# Patient Record
Sex: Female | Born: 1980 | Race: White | Hispanic: No | Smoking: Never smoker
Health system: Southern US, Community
[De-identification: ages and names within clinical notes are randomized; demographics above are authoritative.]

## PROBLEM LIST (undated history)

## (undated) ENCOUNTER — Ambulatory Visit: Admission: EM | Payer: Self-pay

## (undated) DIAGNOSIS — R87629 Unspecified abnormal cytological findings in specimens from vagina: Secondary | ICD-10-CM

## (undated) DIAGNOSIS — F32A Depression, unspecified: Secondary | ICD-10-CM

## (undated) DIAGNOSIS — F419 Anxiety disorder, unspecified: Secondary | ICD-10-CM

## (undated) DIAGNOSIS — F329 Major depressive disorder, single episode, unspecified: Secondary | ICD-10-CM

## (undated) DIAGNOSIS — K219 Gastro-esophageal reflux disease without esophagitis: Secondary | ICD-10-CM

## (undated) DIAGNOSIS — I10 Essential (primary) hypertension: Secondary | ICD-10-CM

## (undated) DIAGNOSIS — IMO0002 Reserved for concepts with insufficient information to code with codable children: Secondary | ICD-10-CM

## (undated) DIAGNOSIS — R87619 Unspecified abnormal cytological findings in specimens from cervix uteri: Secondary | ICD-10-CM

## (undated) HISTORY — DX: Essential (primary) hypertension: I10

## (undated) HISTORY — DX: Reserved for concepts with insufficient information to code with codable children: IMO0002

## (undated) HISTORY — DX: Unspecified abnormal cytological findings in specimens from vagina: R87.629

## (undated) HISTORY — DX: Unspecified abnormal cytological findings in specimens from cervix uteri: R87.619

## (undated) HISTORY — PX: OTHER SURGICAL HISTORY: SHX169

## (undated) HISTORY — DX: Gastro-esophageal reflux disease without esophagitis: K21.9

---

## 2008-11-16 ENCOUNTER — Ambulatory Visit: Payer: Self-pay | Admitting: Obstetrics & Gynecology

## 2008-11-16 LAB — CONVERTED CEMR LAB
Basophils Relative: 0 % (ref 0–1)
Eosinophils Absolute: 0.1 10*3/uL (ref 0.0–0.7)
Hemoglobin: 13.2 g/dL (ref 12.0–15.0)
Hepatitis B Surface Ag: NEGATIVE
Lymphs Abs: 2.2 10*3/uL (ref 0.7–4.0)
MCHC: 32 g/dL (ref 30.0–36.0)
MCV: 89.8 fL (ref 78.0–100.0)
Monocytes Absolute: 0.4 10*3/uL (ref 0.1–1.0)
Monocytes Relative: 6 % (ref 3–12)
Neutrophils Relative %: 60 % (ref 43–77)
RBC: 4.6 M/uL (ref 3.87–5.11)
Rh Type: POSITIVE
Rubella: 27 intl units/mL — ABNORMAL HIGH
WBC: 6.8 10*3/uL (ref 4.0–10.5)
hCG, Beta Chain, Quant, S: 148.9 milliintl units/mL

## 2008-11-19 ENCOUNTER — Inpatient Hospital Stay (HOSPITAL_COMMUNITY): Admission: AD | Admit: 2008-11-19 | Discharge: 2008-11-19 | Payer: Self-pay | Admitting: Obstetrics and Gynecology

## 2008-11-21 ENCOUNTER — Inpatient Hospital Stay (HOSPITAL_COMMUNITY): Admission: AD | Admit: 2008-11-21 | Discharge: 2008-11-21 | Payer: Self-pay | Admitting: Obstetrics and Gynecology

## 2008-11-24 ENCOUNTER — Ambulatory Visit (HOSPITAL_COMMUNITY): Admission: AD | Admit: 2008-11-24 | Discharge: 2008-11-24 | Payer: Self-pay | Admitting: Obstetrics & Gynecology

## 2008-11-28 ENCOUNTER — Ambulatory Visit: Payer: Self-pay | Admitting: Family Medicine

## 2008-11-28 ENCOUNTER — Encounter: Payer: Self-pay | Admitting: Obstetrics & Gynecology

## 2009-12-26 ENCOUNTER — Other Ambulatory Visit: Admission: RE | Admit: 2009-12-26 | Discharge: 2009-12-26 | Payer: Self-pay | Admitting: Obstetrics and Gynecology

## 2010-05-30 LAB — DIFFERENTIAL
Basophils Absolute: 0 10*3/uL (ref 0.0–0.1)
Basophils Relative: 0 % (ref 0–1)
Eosinophils Absolute: 0.1 10*3/uL (ref 0.0–0.7)
Neutro Abs: 6.3 10*3/uL (ref 1.7–7.7)
Neutrophils Relative %: 65 % (ref 43–77)

## 2010-05-30 LAB — CBC
Platelets: 425 10*3/uL — ABNORMAL HIGH (ref 150–400)
RBC: 4.31 MIL/uL (ref 3.87–5.11)
WBC: 9.7 10*3/uL (ref 4.0–10.5)

## 2010-05-30 LAB — CREATININE, SERUM
Creatinine, Ser: 0.67 mg/dL (ref 0.4–1.2)
GFR calc Af Amer: >60 mL/min (ref >60)
GFR calc non Af Amer: >60 mL/min (ref >60)

## 2010-05-30 LAB — HCG, QUANTITATIVE, PREGNANCY
hCG, Beta Chain, Quant, S: 1141 m[IU]/mL — ABNORMAL HIGH (ref <5)
hCG, Beta Chain, Quant, S: 1194 m[IU]/mL — ABNORMAL HIGH (ref <5)

## 2010-05-30 LAB — BUN: BUN: 3 mg/dL — ABNORMAL LOW (ref 6–23)

## 2010-05-30 LAB — ABO/RH: ABO/RH(D): O POS

## 2010-06-02 ENCOUNTER — Inpatient Hospital Stay (HOSPITAL_COMMUNITY): Payer: BC Managed Care – PPO

## 2010-06-02 ENCOUNTER — Inpatient Hospital Stay (HOSPITAL_COMMUNITY)
Admission: AD | Admit: 2010-06-02 | Discharge: 2010-06-03 | DRG: 886 | Disposition: A | Discharge status: Home / Self Care | Payer: BC Managed Care – PPO | Source: Ambulatory Visit | Attending: Family Medicine | Admitting: Family Medicine

## 2010-06-02 DIAGNOSIS — O139 Gestational [pregnancy-induced] hypertension without significant proteinuria, unspecified trimester: Principal | ICD-10-CM | POA: Diagnosis present

## 2010-06-02 LAB — COMPREHENSIVE METABOLIC PANEL
ALT: 16 U/L (ref 0–35)
AST: 24 U/L (ref 0–37)
Albumin: 2.7 g/dL — ABNORMAL LOW (ref 3.5–5.2)
Chloride: 102 mEq/L (ref 96–112)
Creatinine, Ser: 0.55 mg/dL (ref 0.4–1.2)
GFR calc Af Amer: >60 mL/min (ref >60)
Sodium: 135 mEq/L (ref 135–145)
Total Bilirubin: 0.2 mg/dL — ABNORMAL LOW (ref 0.3–1.2)

## 2010-06-02 LAB — DIFFERENTIAL
Basophils Absolute: 0 10*3/uL (ref 0.0–0.1)
Basophils Relative: 0 % (ref 0–1)
Eosinophils Absolute: 0.1 10*3/uL (ref 0.0–0.7)
Monocytes Relative: 7 % (ref 3–12)
Neutro Abs: 8.1 10*3/uL — ABNORMAL HIGH (ref 1.7–7.7)
Neutrophils Relative %: 70 % (ref 43–77)

## 2010-06-02 LAB — PROTEIN / CREATININE RATIO, URINE: Protein Creatinine Ratio: 0.24 — ABNORMAL HIGH (ref 0.00–0.15)

## 2010-06-02 LAB — CBC
MCH: 30 pg (ref 26.0–34.0)
Platelets: 272 10*3/uL (ref 150–400)
RBC: 3.73 MIL/uL — ABNORMAL LOW (ref 3.87–5.11)
WBC: 11.6 10*3/uL — ABNORMAL HIGH (ref 4.0–10.5)

## 2010-06-03 LAB — CREATININE CLEARANCE, URINE, 24 HOUR
Creatinine, Urine: 55.1 mg/dL
Creatinine: 0.55 mg/dL (ref 0.4–1.2)
Urine Total Volume-CRCL: 2200 mL

## 2010-06-06 ENCOUNTER — Inpatient Hospital Stay (HOSPITAL_COMMUNITY): Admit: 2010-06-06 | Payer: BC Managed Care – PPO | Admitting: Obstetrics & Gynecology

## 2010-06-06 ENCOUNTER — Inpatient Hospital Stay (HOSPITAL_COMMUNITY)
Admission: AD | Admit: 2010-06-06 | Discharge: 2010-06-10 | DRG: 886 | Disposition: A | Discharge status: Home / Self Care | Payer: BC Managed Care – PPO | Source: Ambulatory Visit | Attending: Obstetrics & Gynecology | Admitting: Obstetrics & Gynecology

## 2010-06-06 DIAGNOSIS — IMO0002 Reserved for concepts with insufficient information to code with codable children: Principal | ICD-10-CM | POA: Diagnosis present

## 2010-06-06 LAB — CBC
HCT: 34.2 % — ABNORMAL LOW (ref 36.0–46.0)
MCV: 91.2 fL (ref 78.0–100.0)
RBC: 3.75 MIL/uL — ABNORMAL LOW (ref 3.87–5.11)
WBC: 11.5 10*3/uL — ABNORMAL HIGH (ref 4.0–10.5)

## 2010-06-06 LAB — WET PREP, GENITAL
Trich, Wet Prep: NONE SEEN
Yeast Wet Prep HPF POC: NONE SEEN

## 2010-06-06 LAB — COMPREHENSIVE METABOLIC PANEL
Alkaline Phosphatase: 90 U/L (ref 39–117)
BUN: 7 mg/dL (ref 6–23)
CO2: 20 mEq/L (ref 19–32)
Chloride: 103 mEq/L (ref 96–112)
Glucose, Bld: 116 mg/dL — ABNORMAL HIGH (ref 70–99)
Potassium: 4 mEq/L (ref 3.5–5.1)
Total Bilirubin: 0.3 mg/dL (ref 0.3–1.2)

## 2010-06-07 LAB — GC/CHLAMYDIA PROBE AMP, GENITAL: Chlamydia, DNA Probe: NEGATIVE

## 2010-06-08 LAB — CREATININE, URINE, 24 HOUR
Creatinine, 24H Ur: 1451 mg/d (ref 700–1800)
Creatinine, Urine: 71.6 mg/dL
Urine Total Volume-UCRE24: 2025 mL

## 2010-06-08 LAB — STREP B DNA PROBE: Strep Group B Ag: NEGATIVE

## 2010-06-09 LAB — COMPREHENSIVE METABOLIC PANEL
CO2: 23 mEq/L (ref 19–32)
Calcium: 9 mg/dL (ref 8.4–10.5)
Creatinine, Ser: 0.57 mg/dL (ref 0.4–1.2)
GFR calc non Af Amer: >60 mL/min (ref >60)
Glucose, Bld: 140 mg/dL — ABNORMAL HIGH (ref 70–99)

## 2010-06-09 LAB — CBC
HCT: 35.2 % — ABNORMAL LOW (ref 36.0–46.0)
Hemoglobin: 11.5 g/dL — ABNORMAL LOW (ref 12.0–15.0)
MCH: 30.3 pg (ref 26.0–34.0)
MCHC: 32.7 g/dL (ref 30.0–36.0)

## 2010-06-09 LAB — PROTEIN, URINE, 24 HOUR: Protein, 24H Urine: 182 mg/d — ABNORMAL HIGH (ref 50–100)

## 2010-06-10 NOTE — Discharge Summary (Signed)
  [name], [place]            ACCOUNT NO.:  [id number]  MEDICAL RECORD NO.:  [id number]           PATIENT TYPE:  I  LOCATION:  9158                          FACILITY:  [facility]  PHYSICIAN:  Catalina Antigua, MD     DATE OF BIRTH:  02/21/1981  DATE OF ADMISSION:  06/02/2010 DATE OF DISCHARGE:  06/03/2010                              DISCHARGE SUMMARY   This is a 30 year old G4, P0-0-3-0 at 34 weeks and 3 days who presented on June 02, 2010, for further evaluation of possible pregnancy-induced hypertension versus preeclampsia.  While the patient was being evaluated in the office, she had blood pressures of 160s over 80s and when presented to the MAU, blood pressure was 157/87.  The patient was otherwise asymptomatic and denies any headache, visual disturbances, right upper quadrant or epigastric pain .  The patient was admitted for 24-hour observation.  Laboratory values on admission were essentially within the normal range with platelets of 272, creatinine of 0.55.  LFTs were AST/ALT of 24 and 16, hemoglobin was 11.2.  She had urine creatinine ratio 0.24. Ultrasound showed the fetus in vertex position weighing 4 pounds 9 ounces at 26 percentile and AFI of 14.  Throughout her stay, the fetal status remained reassuring and her blood pressure ranged from mainly 140s to 146 over 70s to low 80s.  She had occasional blood pressures of 150s over 80s.  The patient remained asymptomatic as mentioned before. 24-hour urine revealed proteinuria of 198 mg.  After 24-hour observation period, the patient was felt to be stable for discharge with diagnosis of gestational hypertension.  The patient was advised to contact the office on Wednesday morning and plan for followup evaluation.  The patient was advised to return to the MAU or contact our office if she felt headaches, visual disturbances, or epigastric pain or if she develops any signs of labor.  Fetal movement precautions were also advised.   The patient verbalized understanding.  All questions were answered.     Catalina Antigua, MD     PC/MEDQ  D:  06/03/2010  T:  06/04/2010  Job:  [id number]  Electronically Signed by Catalina Antigua  on 06/10/2010 09:18:08 AM

## 2010-06-16 NOTE — Discharge Summary (Signed)
NAMEBRADLEY, [name]            ACCOUNT NO.:  [id number]  MEDICAL RECORD NO.:  [id number]           PATIENT TYPE:  I  LOCATION:  9158                          [facility]ACILITY:  [facility]  PHYSICIAN:  Horton Chin, MD DATE OF BIRTH:  06/17/1980  DATE OF ADMISSION:  06/06/2010 DATE OF DISCHARGE:  06/10/2010                              DISCHARGE SUMMARY   ADMISSION DIAGNOSES: 1. Intrauterine pregnancy at 35-2/7 weeks' gestation. 2. Mild preeclampsia.  DISCHARGE DIAGNOSES: 1. Intrauterine pregnancy at 35-6/7 weeks. 2. Continued mild preeclampsia.  PERTINENT STUDIES:  The patient did have an office 24-hour urine protein that came back at 300 mg prior to admission, however, a 24-hour urine collection that was done after admission came back with a protein level of 182.  She had a normal comprehensive metabolic panel.  Platelet count was 258.  GBS was negative.  Of note, the patient did have a complete OB ultrasound that was done prior to admission on June 02, 2010, which showed an estimated fetal weight of 2077 g in the 26 percentile with an AFI of 14.46, cephalic presentation, normal placenta, biophysical of 8/8, and concordant head circumference and abdominal circumference.  PROCEDURES:  Administration of antihypertensive therapy in the form of labetalol.  BRIEF HOSPITAL COURSE:  The patient is a 30 year old, gravida 4, para 0- 0-3-0 who was admitted at 35-2/7 weeks with concern of mild preeclampsia and worsening blood pressures.  In the clinic, she was noted to have systolics in 160s, however, when she came here her blood pressure was noted to be in 100s-150s/80s-90s.  The patient was admitted for observation and a repeat over 24-hour urine.  Of note, she did have a prior 24-hour urine in the office that was 300 mg of protein.  The patient had no worsening symptoms and had a reassuring fetal status. Her blood pressures were all in mild range initially and 24-hour urine did return  at 182.  On June 09, 2010, plans were made for the patient to be discharged to home.  However, a blood pressure that was checked was 170/106 and then 160/94.  The patient had no other symptoms and baby was reassuring.  Given the concern about severe range blood pressures, the decision was made to start the patient on some antihypertensive therapy.  She did receive labetalol 200 mg initially to treat this blood pressure, however, her blood pressure went down to the systolic of 90s, diastolic of 16X after just one dose of labetalol 200.  It did immediately recover to 120-130s without any intervention, but it seemed like the patient was exquisitely sensitive to the labetalol.  The decision was made to lower the dose of the labetalol to 100 mg twice a day and on this regimen her blood pressures were in the 130s-140, mainly over 80s-90s.  She did have an isolated 150/90.  She was told that if she has any worsening symptoms or more severe range blood pressures that are not uncontrolled by medication that could be indication for delivery and magnesium sulfate administration and she understands that, but given that her blood pressure is now controlled on the labetalol and she has  no other signs or symptoms or worsening preeclampsia she is a candidate for outpatient management at this point with twice weekly NSTs, twice weekly labs, serial fetal growth scan, AFI checks every week and all  can be done on an outpatient basis.  Blood pressure precautions were evaluated with the patient.  An appointment has been made for her at [facility] on June 12, 2010, for her MD visit and NST.  DISCHARGE MEDICATIONS:  Labetalol 100 mg twice a day.  The patient was told to continue her prenatal vitamins.  The patient was given routine antenatal discharge instructions also and told to call or come back to the MAU if she has any further symptoms or any signs or symptoms of worsening preeclampsia.     Horton Chin, MD     UAA/MEDQ  D:  06/10/2010  T:  06/10/2010  Job:  [id number]  Electronically Signed by Jaynie Collins MD on 06/16/2010 12:18:51 PM

## 2010-06-17 NOTE — H&P (Signed)
NAMEJILLEEN, [name]            ACCOUNT NO.:  [id number]  MEDICAL RECORD NO.:  [id number]           PATIENT TYPE:  LOCATION:                                 FACILITY:  PHYSICIAN:  Tilda Burrow, M.D. DATE OF BIRTH:  1980/07/11  DATE OF ADMISSION: DATE OF DISCHARGE:                             HISTORY & PHYSICAL   ADMISSION DIAGNOSES:  Pregnancy at [redacted] weeks gestation, preeclampsia, unfavorable cervix, scheduled for cervical ripening and induction history of IVF through [employer].  HISTORY OF PRESENT ILLNESS:  This 30 year old female gravida 4, para 0-3- 0 with history of 2 ectopics once which required right salpingectomy with first pregnancy, who is admitted at this time for induction of labor.  Prenatal course has been notable for blood pressures that were completely normal when she transferred to our office at 10 weeks and blood pressure is 130/71, 130/80, 132/80 in the first trimester.  She then developed a mildly elevated blood pressures beginning around 28 weeks with blood pressure 140/90 negative proteinuria.  Blood pressures were 180/88 on outpatient basis 34 weeks for which she was admitted on. Blood pressures dropped.  She had outpatient 24-hour urine that was around 300 mg protein but upon repeat in-house it was less than 200 mg. She has was placed on labetalol 100 mg b.i.d.  She proved exquisitely sensitive to that drug and could not be given higher doses.  Since discharged home on June 10, 2010, at 35 weeks 2 days.  She has been followed in the office.  Blood pressures were 140/90 at 36 weeks change. Today they are 140/102 on June 16, 2010, recheck 140/90, and 24 hour urine has been collecting returns showing urine protein was again 340 mg per day.  She denies symptomatology.  Liver enzymes are currently normal, platelets are 245,000.  The patient was admitted at 37.0 weeks for Cytotec cervical ripening and eventual induction of labor.   NST performed on June 16, 2010 is excellently reactive and AFI is 8.0.  PHYSICAL EXAMINATION:  GENERAL:  Healthy-appearing Caucasian female, weight 182, blood pressure 140/102 then 140/90. HEENT:  Pupils equal, round, and reactive. NECK:  Supple. ABDOMEN:  Gravid uterus 32 cm fundal height, vertex presentation with AFI of 8.0 on ultrasound by me, NST is reactive.  LABORATORY DATA:  A 24-hour urine as described.  GBS was collected as well as GC and Chlamydia on this date.  Prenatal labs include blood type O+, rubella immunity present.  Original platelet count 322,000, hepatitis, HIV, RPR, GC and Chlamydia all negative.  Pap smear LSIL to be followed up postpartum.  MSAFP declined, hemoglobin 10.8, glucose tolerance test normal.  She had an ultrasound for estimated fetal weight while in hospital 2 weeks ago, please check those records for details.  PLAN:  Admit at 37 weeks for Cytotec ripening and Pitocin induction of labor, with likely Foley bulb use as well.     Tilda Burrow, M.D.     JVF/MEDQ  D:  06/16/2010  T:  06/17/2010  Job:  [id number]  cc:   [facility]  Electronically Signed by Christin Bach M.D. on 06/17/2010 05:05:16 PM

## 2010-06-18 ENCOUNTER — Inpatient Hospital Stay (HOSPITAL_COMMUNITY)
Admission: AD | Admit: 2010-06-18 | Discharge: 2010-06-22 | DRG: 651 | Disposition: A | Discharge status: Home / Self Care | Payer: BC Managed Care – PPO | Source: Ambulatory Visit | Attending: Obstetrics and Gynecology | Admitting: Obstetrics and Gynecology

## 2010-06-18 DIAGNOSIS — O330 Maternal care for disproportion due to deformity of maternal pelvic bones: Secondary | ICD-10-CM | POA: Diagnosis present

## 2010-06-18 DIAGNOSIS — Z302 Encounter for sterilization: Secondary | ICD-10-CM

## 2010-06-18 DIAGNOSIS — O339 Maternal care for disproportion, unspecified: Secondary | ICD-10-CM | POA: Diagnosis present

## 2010-06-18 DIAGNOSIS — IMO0002 Reserved for concepts with insufficient information to code with codable children: Principal | ICD-10-CM | POA: Diagnosis present

## 2010-06-18 LAB — CBC
HCT: 33.7 % — ABNORMAL LOW (ref 36.0–46.0)
Hemoglobin: 11.1 g/dL — ABNORMAL LOW (ref 12.0–15.0)
WBC: 9.5 10*3/uL (ref 4.0–10.5)

## 2010-06-18 LAB — LACTATE DEHYDROGENASE: LDH: 205 U/L (ref 94–250)

## 2010-06-18 LAB — COMPREHENSIVE METABOLIC PANEL
ALT: 16 U/L (ref 0–35)
Alkaline Phosphatase: 101 U/L (ref 39–117)
CO2: 20 mEq/L (ref 19–32)
Glucose, Bld: 102 mg/dL — ABNORMAL HIGH (ref 70–99)
Potassium: 4 mEq/L (ref 3.5–5.1)
Sodium: 136 mEq/L (ref 135–145)
Total Protein: 5.5 g/dL — ABNORMAL LOW (ref 6.0–8.3)

## 2010-06-19 ENCOUNTER — Other Ambulatory Visit: Payer: Self-pay | Admitting: Obstetrics & Gynecology

## 2010-06-19 DIAGNOSIS — O330 Maternal care for disproportion due to deformity of maternal pelvic bones: Secondary | ICD-10-CM

## 2010-06-19 DIAGNOSIS — IMO0002 Reserved for concepts with insufficient information to code with codable children: Secondary | ICD-10-CM

## 2010-06-19 DIAGNOSIS — O339 Maternal care for disproportion, unspecified: Secondary | ICD-10-CM

## 2010-06-19 LAB — CBC
HCT: 34.2 % — ABNORMAL LOW (ref 36.0–46.0)
Hemoglobin: 11.5 g/dL — ABNORMAL LOW (ref 12.0–15.0)
Hemoglobin: 11.6 g/dL — ABNORMAL LOW (ref 12.0–15.0)
MCH: 30 pg (ref 26.0–34.0)
MCH: 30.8 pg (ref 26.0–34.0)
MCHC: 32.6 g/dL (ref 30.0–36.0)
MCHC: 33.9 g/dL (ref 30.0–36.0)
MCV: 92.2 fL (ref 78.0–100.0)
MCV: 90.7 fL (ref 78.0–100.0)

## 2010-06-19 LAB — MAGNESIUM: Magnesium: 6.6 mg/dL (ref 1.5–2.5)

## 2010-06-20 LAB — CBC
HCT: 30.9 % — ABNORMAL LOW (ref 36.0–46.0)
Hemoglobin: 9.9 g/dL — ABNORMAL LOW (ref 12.0–15.0)
MCH: 29.8 pg (ref 26.0–34.0)
MCHC: 32 g/dL (ref 30.0–36.0)
MCV: 93.1 fL (ref 78.0–100.0)
Platelets: 209 10*3/uL (ref 150–400)
RBC: 3.32 MIL/uL — ABNORMAL LOW (ref 3.87–5.11)
RDW: 16.2 % — ABNORMAL HIGH (ref 11.5–15.5)
WBC: 14.5 10*3/uL — ABNORMAL HIGH (ref 4.0–10.5)

## 2010-06-20 LAB — COMPREHENSIVE METABOLIC PANEL
BUN: 6 mg/dL (ref 6–23)
Calcium: 6.7 mg/dL — ABNORMAL LOW (ref 8.4–10.5)
Creatinine, Ser: 0.84 mg/dL (ref 0.4–1.2)
GFR calc non Af Amer: >60 mL/min (ref >60)
Glucose, Bld: 108 mg/dL — ABNORMAL HIGH (ref 70–99)
Total Protein: 5.1 g/dL — ABNORMAL LOW (ref 6.0–8.3)

## 2010-06-20 LAB — MRSA PCR SCREENING: MRSA by PCR: NEGATIVE

## 2010-06-22 NOTE — Discharge Summary (Signed)
NAMESINTIA, [name]            ACCOUNT NO.:  [id number]  MEDICAL RECORD NO.:  [id number]           PATIENT TYPE:  I  LOCATION:  9116                          [facility]ACILITY:  [facility]  PHYSICIAN:  Tilda Burrow, M.D. DATE OF BIRTH:  April 25, 1980  DATE OF ADMISSION:  06/18/2010 DATE OF DISCHARGE:  06/22/2010                              DISCHARGE SUMMARY   ADMITTING DIAGNOSES: 1. Pregnancy, 37 weeks' gestation. 2. Preeclampsia. 3. Favorable cervix. 4. History of in-vitro fertilization pregnancy.  DISCHARGE DIAGNOSES: 1. Pregnancy at 37 weeks, delivered. 2. Mild preeclampsia. 3. Failure to progress.  PROCEDURE: 1. Foley bulb cervical ripening, Tilda Burrow, MD. 2. Primary low-transverse cesarean section, delivering a 4-pounds-13-     ounce female infant, Apgars 9 and 9; Myra Dove, MD. 3. Left salpingectomy, Allie Bossier, MD.  DISCHARGE MEDICATIONS: 1. Labetalol 100 mg p.o. b.i.d. 2,  Prenatal vitamins 1 p.o. daily. 1. Percocet 5/325 one p.o. q.6 h. p.r.n. pain. 2. Motrin 600 mg 1 p.o. q.6 h. p.r.n. mild pain. 3. Prenatal vitamins 1 p.o. daily.  FOLLOWUP:  Five days for blood pressure check and weight check.  HOSPITAL SUMMARY:  This is a 30 year old female gravida 4, para 0-3-0-0, with history of right salpingectomy for ectopic followed by 2 ectopics involving the left tube, and successful singleton in-vitro fertilization through [facility] [employer].  Prenatal course followed through [facility] was notable for elevated blood pressures beginning at approximately 34 weeks' gestation with proteinuria greater than 300 mg per day.  She made it to 37 weeks, at which time, the fundal height was only 32 cm, AFI was 8.0, and vertex presentation with formed cervix confirmed by ultrasound.  The blood pressure is 140/102 with repeat 24-hour urine protein greater than 300 mg per day.  Liver enzymes were normal.  The patient was admitted for induction of labor at  37 weeks.  Hospital course consisted of Foley bulb cervical ripening as the cervix was 2 cm.  She then was placed on Pitocin and had a steady progression of labor to 8 cm with arrest of dilation and descent at a -3 station despite the small fetal status.  Four hours at 8 cm was followed by recommendation for cesarean section.  The patient underwent C-section and delivered a healthy female infant, Apgars 8 and 9, 4 pounds 13 ounces with 700 mL EBL.  The patient received magnesium sulfate during labor and for 24 hours postpartum.  Blood pressures remained stable. These were in the 140/90 range.  She was maintained on labetalol. Postoperative hemoglobin was 11.6, hematocrit 34, platelets 228,000. The patient was afebrile.  She was discharged home, breastfeeding, bottle supplementing.  Of note, the patient had spoken to her reproductive endocrinologist who had recommended salpingectomy, so this was performed at the time of the C-section.  Routine postop instructions given with the additional advice to come to the office in 5 days for blood pressure check and weight check, or earlier p.r.n. headache, right upper quadrant pain, or worsening overall wellness.     Tilda Burrow, M.D.     JVF/MEDQ  D:  06/22/2010  T:  06/22/2010  Job:  [id number]  cc:   [facility]  Electronically Signed by Christin Bach M.D. on 06/22/2010 12:05:34 PM

## 2010-06-23 NOTE — Op Note (Addendum)
[name], [name]            ACCOUNT NO.:  [id number]  MEDICAL RECORD NO.:  [id number]         PATIENT TYPE:  WINP  LOCATION:  371                           [facility]ILITY:  [facility]  PHYSICIAN:  Allie Bossier, MD        DATE OF BIRTH:  19-Jun-1980  DATE OF PROCEDURE:  06/19/2010 DATE OF DISCHARGE:                              OPERATIVE REPORT   PREOPERATIVE DIAGNOSES: 1. Intrauterine pregnancy at 37 weeks and 1 day. 2. Superimposed preeclampsia on gestational hypertension. 3. Cephalopelvic disproportion and arrest of dilatation and failure to     progress.  POSTOPERATIVE DIAGNOSES: 1. Intrauterine pregnancy at 37 weeks and 1 day. 2. Superimposed preeclampsia on gestational hypertension. 3. Cephalopelvic disproportion and arrest of dilatation and failure to     progress.  PROCEDURE:  A primary low transverse cesarean section with left salpingectomy via Pfannenstiel incision.  SURGEON:  Dr. Marice Potter and Dr. Orvan Falconer.  ANESTHESIA:  Epidural with additional local of 0.5% Marcaine 30 mL.  IV FLUIDS:  1500 mL.  URINE OUTPUT:  200 mL.  ESTIMATED BLOOD LOSS:  700 mL.  FINDINGS:  A viable female infant in vertex presentation with clear amniotic fluid.  Apgars were 8 and 9, weight was 4 pounds 13 ounces. Normal uterus and normal left adnexa.  SPECIMENS:  Placenta and left fallopian tube sent to Pathology.  COMPLICATIONS:  None immediate.  INDICATIONS:  This is a 30 year old gravida 4, para 0-0-3-0 with history of ectopic and status post right salpingectomy with intrauterine pregnancy conceived via in vitro fertilization currently complicated by gestational hypertension with superimposed mild preeclampsia.  The patient presented with induction on June 18, 2010.  She received Cytotec.  She also received a Foley bulb.  She was started on Pitocin. She did receive an epidural.  She had an IUPC placed at approximately 1500 by which the patient was notable with adequate contractions for  over 3 hours.   She remained at 8 cm with 80% effacement, -1 station and then subsequent exam, she continued to have some cervical swelling and then she had decreased urine output, blood tinged urine, her magnesium level was 6.8 which was decreased to 1 g an hour. Due to the lack of cervical change with adequate contractions,  cervical swelling, blood tinged urine and preeclampsia decision was made to  proceed with a primary Cesarean with Left salpingectomy.   The patient was  consented for primary low transverse cesarean section with left salpingectomy as recommended by Reproductive Endocrinology due to the patient having a history of multiple ectopics.  Decision was made that the patient will continue to conceive via in vitro fertilization and we will remove her left fallopian tube at the time of her cesarean section.  The patient was consented, risks, benefits, and alternatives were discussed with the patient and the patient agreed to proceed with aforementioned procedures.  PROCEDURE IN DETAIL:  After epidural was found to be adequate, the patient was placed in a dorsal supine position with a leftward tilt. The patient was prepped and draped in normal sterile fashion.  A Pfannenstiel skin incision was then made with a scalpel and carried down  through to the underlying fascia.  Fascia was then incised in the midline.  The vaginal incision was then extended with the Mayo scissors. The rectus muscles were then ligated in the midline using the electrocautery approximately 20% bilaterally and was extended manually. The peritoneum was then entered bluntly.  The bladder blade was then inserted.  The lower uterine segment then incised in transverse fashion with scalpel.  The incision was extended manually and infant was noted to be in vertex presentation and was delivered otherwise atraumatically. Mouth and nose were bulb suctioned.  Cord was cut and clamped and infant was handed off to  awaiting NICU.  Apgars were 8 and 9, weight was 4 pounds 13 ounces.  The placenta was delivered spontaneously intact with a three-vessel cord.  The uterus was then cleared of all clots and debris.  The uterine incision was then closed using 2-0 Vicryl in one layer running  and locking because she had a very thin lower uterine segment.  The patient did have additional areas of hemostasis with one figure-of-eight.  The incision was found to be hemostatic.  Attention was then turned to the left adnexa, left fallopian tube was identified, grasped with a Babcock clamp, followed out to the fimbriae and then a clamp was placed along with mesosalpinx.  In multiple steps, the fallopian  tube with incised above the clamp to the tip and using the Haney technique a  2-0 vicryl was used for hemostasis at the pedicle.  The proximally the clamp was  placed and in similar fashion the fallopian tube was incised and the pedicle was  ligated with a 2-0 vicryl.  The pedicle was found to be hemostatic. The peritoneum was then cleared of all clots and debris.  The fascial incision was then closed with an 0 Vicryl in a running fashion. Subcutaneous tissue was then irrigated and was found to be additional hemostasis with electrocautery.  The skin was then closed using 4-0 Vicryl in a subcuticular fashion.  Lap, needle, and sponge counts were correct x2.  The patient received antibiotics perioperatively, and the patient was taken back to the recovery area in stable condition.    ______________________________ Maryelizabeth Kaufmann, MD   ______________________________ Allie Bossier, MD    LC/MEDQ  D:  06/19/2010  T:  06/20/2010  Job:  [id number]  Electronically Signed by Maryelizabeth Kaufmann MD on 06/23/2010 02:14:05 PM Electronically Signed by Nicholaus Bloom MD on 06/26/2010 08:21:01 AM

## 2010-07-23 NOTE — H&P (Signed)
  [name], AHMED            ACCOUNT NO.:  [id number]  MEDICAL RECORD NO.:  [id number]           PATIENT TYPE:  LOCATION:                                 FACILITY:  PHYSICIAN:  Tilda Burrow, M.D. DATE OF BIRTH:  1980/05/27  DATE OF ADMISSION: DATE OF DISCHARGE:                             HISTORY & PHYSICAL   ADMISSION DIAGNOSES:  Pregnancy at 37+ weeks gestation, mild preeclampsia, cervical favorability.  HISTORY OF PRESENT ILLNESS:  This 30 year old female gravida 4, para 0-0- 3-0, due Jul 09, 2010, by conception history based on LMP of October 16, 2009, and confirmed by first trimester ultrasound, is admitted for induction of labor after 24-hour urine obtained due to elevated blood pressures that began to be noted at 34-1/2 weeks' gestation, has remained mild, but her proteinuria is now running 300 mg per day.  The patient is admitted for induction due to mild preeclampsia criteria and term.  PAST MEDICAL HISTORY:  Benign.  SURGICAL HISTORY:  Two ectopic surgeries and one methotrexate therapy for ectopic.  She has a pregnancy that is conceived by in vitro fertilization.  She desires to have salpingectomy if C-section is required due to consultation with her maternal fetal medicine physician, Dr. Morrison Old.  GYN HISTORY:  Positive for history of abnormal Pap and this pregnancy is prenatal record notable for in vitro fertilization.  She has preserved eggs, and storage and cryopreservation.  LABORATORY DATA:  Admitting hemoglobin of 11, hematocrit of 33, platelets 257,000.  Liver enzymes are normal.  PHYSICAL EXAMINATION:  VITAL SIGNS:  Height 5 feet 5 inches, weight 190, blood pressure is 140/90.  Urine protein is negative at this time, but 300 mg upon recent evaluation.  IMPRESSION:  Physical exam shows term size fetus in vertex presentation. Cervix 2 cm, 50%, -2, vertex well applied.  PLAN:  Admit for magnesium sulfate therapy and Pitocin induction  of labor.  Good prognosis for vaginal delivery.     Tilda Burrow, M.D.     JVF/MEDQ  D:  07/19/2010  T:  07/20/2010  Job:  [id number]  Electronically Signed by Christin Bach M.D. on 07/23/2010 09:35:43 PM

## 2011-10-07 ENCOUNTER — Other Ambulatory Visit (HOSPITAL_COMMUNITY)
Admission: RE | Admit: 2011-10-07 | Discharge: 2011-10-07 | Disposition: A | Discharge status: Home / Self Care | Payer: 59 | Source: Ambulatory Visit | Attending: Obstetrics and Gynecology | Admitting: Obstetrics and Gynecology

## 2011-10-07 ENCOUNTER — Other Ambulatory Visit: Payer: Self-pay | Admitting: Obstetrics and Gynecology

## 2011-10-07 DIAGNOSIS — Z01419 Encounter for gynecological examination (general) (routine) without abnormal findings: Secondary | ICD-10-CM | POA: Insufficient documentation

## 2011-10-07 DIAGNOSIS — R87810 Cervical high risk human papillomavirus (HPV) DNA test positive: Secondary | ICD-10-CM | POA: Insufficient documentation

## 2012-07-08 ENCOUNTER — Encounter: Payer: Self-pay | Admitting: *Deleted

## 2012-07-08 DIAGNOSIS — Z87898 Personal history of other specified conditions: Secondary | ICD-10-CM

## 2012-07-11 ENCOUNTER — Encounter: Payer: Self-pay | Admitting: Obstetrics & Gynecology

## 2012-07-11 ENCOUNTER — Ambulatory Visit (INDEPENDENT_AMBULATORY_CARE_PROVIDER_SITE_OTHER): Payer: 59 | Admitting: Obstetrics & Gynecology

## 2012-07-11 VITALS — BP 110/80 | Ht 60.0 in | Wt 164.0 lb

## 2012-07-11 DIAGNOSIS — N870 Mild cervical dysplasia: Secondary | ICD-10-CM

## 2012-07-11 DIAGNOSIS — R87612 Low grade squamous intraepithelial lesion on cytologic smear of cervix (LGSIL): Secondary | ICD-10-CM

## 2012-07-11 DIAGNOSIS — R87810 Cervical high risk human papillomavirus (HPV) DNA test positive: Secondary | ICD-10-CM

## 2012-07-11 NOTE — Progress Notes (Signed)
Patient ID: [name], female   DOB: Mar 23, 1980, 32 y.o.   MRN: [id number] History of LSIL with +HPV  Today HPV specific done and cytology  If positive proceed with colposcopy again

## 2012-10-27 ENCOUNTER — Encounter: Payer: Self-pay | Admitting: Family Medicine

## 2012-10-27 ENCOUNTER — Ambulatory Visit (INDEPENDENT_AMBULATORY_CARE_PROVIDER_SITE_OTHER): Payer: 59 | Admitting: Family Medicine

## 2012-10-27 ENCOUNTER — Telehealth: Payer: Self-pay | Admitting: Family Medicine

## 2012-10-27 ENCOUNTER — Other Ambulatory Visit: Payer: Self-pay | Admitting: Family Medicine

## 2012-10-27 VITALS — BP 120/82 | Temp 98.6°F | Ht 60.0 in | Wt 165.0 lb

## 2012-10-27 DIAGNOSIS — Z0189 Encounter for other specified special examinations: Secondary | ICD-10-CM

## 2012-10-27 DIAGNOSIS — J019 Acute sinusitis, unspecified: Secondary | ICD-10-CM

## 2012-10-27 DIAGNOSIS — Z0000 Encounter for general adult medical examination without abnormal findings: Secondary | ICD-10-CM

## 2012-10-27 MED ORDER — LEVOFLOXACIN 500 MG PO TABS: 500.0000 mg | ORAL_TABLET | Freq: Every day | ORAL | Status: DC

## 2012-10-27 NOTE — Progress Notes (Signed)
  Subjective:    Patient ID: [name], female    DOB: 1980-06-08, 32 y.o.   MRN: [id number]  HPI  Patient arrives with sinus sx and ears stopped up since Sunday.started Friday Fri and Mon sore throat Tues felt bad  Review of Systems  Constitutional: Positive for appetite change (slight decrease) and fatigue. Negative for fever.  HENT: Positive for congestion, rhinorrhea, postnasal drip and sinus pressure.   Respiratory: Negative for chest tightness, shortness of breath and wheezing.   Cardiovascular: Negative for chest pain.       Objective:   Physical Exam  Nursing note and vitals reviewed. Constitutional: She appears well-developed and well-nourished.  HENT:  Head: Normocephalic and atraumatic.  Neck: Normal range of motion.  Cardiovascular: Normal rate, regular rhythm and normal heart sounds.   Pulmonary/Chest: Effort normal and breath sounds normal. No respiratory distress. She has no wheezes.  Lymphadenopathy:    She has no cervical adenopathy.          Assessment & Plan:  More than likely a viral syndrome but the possibility exists of sinus infection prescription given for Levaquin may fill in 2-3 days if progressive symptoms otherwise if getting better then allowed to get better on its own. Followup if any problems. Warning signs discussed.  Screening labs were ordered.

## 2012-10-27 NOTE — Telephone Encounter (Signed)
Patient would like her Rx's from today, faxed over to [facility].

## 2012-10-27 NOTE — Telephone Encounter (Signed)
Med sent electronically to [facility] outpatient pharmacy. Patient notified.

## 2012-11-02 ENCOUNTER — Other Ambulatory Visit: Payer: Self-pay | Admitting: *Deleted

## 2012-11-02 ENCOUNTER — Telehealth: Payer: Self-pay | Admitting: Family Medicine

## 2012-11-02 MED ORDER — BENZONATATE 100 MG PO CAPS: 100.0000 mg | ORAL_CAPSULE | Freq: Four times a day (QID) | ORAL | Status: DC | PRN

## 2012-11-02 MED ORDER — AZITHROMYCIN 250 MG PO TABS: ORAL_TABLET | ORAL | Status: DC

## 2012-11-02 NOTE — Telephone Encounter (Signed)
Nurse to call, and discuss case with patient. Make sure she's not short of breath or any other emergent signs. Otherwise Z-Pak and if progressive troubles or constant troubles followup here or emergency department

## 2012-11-02 NOTE — Telephone Encounter (Signed)
Patient was seen 9/4 not feeling any better,has really bad cough still to the point chest is hurting .Can you prescribe something else. Something for cough also. Would like it faxed into [facility]. [phone]

## 2012-11-02 NOTE — Telephone Encounter (Signed)
Med sent to pharm.pt notified.  

## 2012-11-02 NOTE — Telephone Encounter (Signed)
Tessalon one tid prn #[phone]

## 2012-11-02 NOTE — Telephone Encounter (Signed)
Pt would like to know if she can get some cough med to take during the day while she is working

## 2012-12-29 ENCOUNTER — Other Ambulatory Visit: Payer: Self-pay

## 2013-01-27 ENCOUNTER — Encounter: Payer: Self-pay | Admitting: Family Medicine

## 2013-01-27 ENCOUNTER — Ambulatory Visit (INDEPENDENT_AMBULATORY_CARE_PROVIDER_SITE_OTHER): Payer: 59 | Admitting: Family Medicine

## 2013-01-27 VITALS — BP 130/86 | Temp 98.9°F | Ht 60.0 in | Wt 170.1 lb

## 2013-01-27 DIAGNOSIS — J029 Acute pharyngitis, unspecified: Secondary | ICD-10-CM

## 2013-01-27 DIAGNOSIS — F329 Major depressive disorder, single episode, unspecified: Secondary | ICD-10-CM

## 2013-01-27 LAB — POCT RAPID STREP A (OFFICE): Rapid Strep A Screen: NEGATIVE

## 2013-01-27 MED ORDER — CLONAZEPAM 0.5 MG PO TABS: 0.5000 mg | ORAL_TABLET | Freq: Two times a day (BID) | ORAL | Status: DC | PRN

## 2013-01-27 MED ORDER — AMOXICILLIN 500 MG PO TABS: 500.0000 mg | ORAL_TABLET | Freq: Three times a day (TID) | ORAL | Status: DC

## 2013-01-27 MED ORDER — CITALOPRAM HYDROBROMIDE 20 MG PO TABS: 20.0000 mg | ORAL_TABLET | Freq: Every day | ORAL | Status: DC

## 2013-01-27 NOTE — Progress Notes (Signed)
   Subjective:    Patient ID: [name], female    DOB: 07/16/1980, 32 y.o.   MRN: [id number]  Sore Throat  This is a new problem. The current episode started in the past 7 days. The problem has been unchanged. Neither side of throat is experiencing more pain than the other. There has been no fever. The pain is at a severity of 5/10. The pain is moderate. Pertinent negatives include no congestion or coughing. She has tried NSAIDs for the symptoms. The treatment provided no relief.  statered Tues night, burning, no congestion, energy OK, drinking ok, no fever  25 minutes was spent with this patient also discussing fatigue tiredness tearfulness lack of energy lack of enjoyment in day-to-day life not suicidal. Is willing to take medication. Symptoms been present over the past several weeks. She had some problems with this before last year but never took any medicine PMH benign  Review of Systems  Constitutional: Positive for fatigue. Negative for fever, chills, activity change and appetite change.  HENT: Positive for sinus pressure. Negative for congestion and rhinorrhea.   Respiratory: Negative for cough and chest tightness.   Cardiovascular: Negative for chest pain.       Objective:   Physical Exam  Vitals reviewed. Constitutional: She appears well-developed and well-nourished.  HENT:  Head: Normocephalic and atraumatic.  Right Ear: External ear normal.  Left Ear: External ear normal.  Neck: Normal range of motion. Neck supple.  Cardiovascular: Normal rate, regular rhythm and normal heart sounds.   No murmur heard. Pulmonary/Chest: Effort normal and breath sounds normal. No respiratory distress. She has no wheezes.  Lymphadenopathy:    She has no cervical adenopathy.          Assessment & Plan:  Sinus headache pharyngitis-antibiotics prescribed Probable depression not suicidal Celexa followup in several weeks recommended counseling she will look into it followup with  [place] if any problems if suicidal she will followup immediately or go to ER

## 2013-01-28 LAB — STREP A DNA PROBE: GASP: NEGATIVE

## 2013-02-24 ENCOUNTER — Ambulatory Visit: Payer: 59 | Admitting: Family Medicine

## 2013-03-15 ENCOUNTER — Telehealth: Payer: Self-pay | Admitting: Family Medicine

## 2013-03-15 NOTE — Telephone Encounter (Signed)
Left message to return call 

## 2013-03-15 NOTE — Telephone Encounter (Signed)
Pt calling to say she did not start the Celexa 10 mg till 03/13/13 Wants to know when you want her to follow up from that date?  She also wants to know since the instructions to take half a pill daily Was not on the bottle did you want her to take the full 20mg ? She has  Only been taking the 10mg  and feels that is perfect at this point?

## 2013-03-15 NOTE — Telephone Encounter (Signed)
Patient notified and verbalized understanding. 

## 2013-03-15 NOTE — Telephone Encounter (Signed)
I would recommend taking a half a tablet daily ongoing. I would also recommend following up in 4 weeks. If any problems followup sooner.

## 2013-04-10 ENCOUNTER — Telehealth: Payer: Self-pay | Admitting: Family Medicine

## 2013-04-10 NOTE — Telephone Encounter (Signed)
Patient said she is doing good on Celexa but is still having some nausea. Patient stated she will discuss this at office visit on Friday.

## 2013-04-10 NOTE — Telephone Encounter (Signed)
Patient would like someone to call her back regarding a medication that she was just put on.

## 2013-04-14 ENCOUNTER — Encounter: Payer: Self-pay | Admitting: Family Medicine

## 2013-04-14 ENCOUNTER — Ambulatory Visit (INDEPENDENT_AMBULATORY_CARE_PROVIDER_SITE_OTHER): Payer: 59 | Admitting: Family Medicine

## 2013-04-14 VITALS — BP 100/82 | Ht 60.0 in | Wt 162.2 lb

## 2013-04-14 DIAGNOSIS — H9190 Unspecified hearing loss, unspecified ear: Secondary | ICD-10-CM

## 2013-04-14 DIAGNOSIS — Z0189 Encounter for other specified special examinations: Secondary | ICD-10-CM

## 2013-04-14 MED ORDER — CLONAZEPAM 0.5 MG PO TABS: 0.5000 mg | ORAL_TABLET | Freq: Two times a day (BID) | ORAL | Status: DC | PRN

## 2013-04-14 MED ORDER — CITALOPRAM HYDROBROMIDE 20 MG PO TABS: 20.0000 mg | ORAL_TABLET | Freq: Every day | ORAL | Status: DC

## 2013-04-14 NOTE — Progress Notes (Signed)
   Subjective:    Patient ID: Charlene DollyChristina M Brown, female    DOB: 1980/05/16, 33 y.o.   MRN: [id number]  Anxiety Presents for follow-up visit. The severity of symptoms is mild. Nothing aggravates the symptoms.   There are no known risk factors. Past treatments include SSRIs. The treatment provided moderate relief. Compliance with prior treatments has been good. Compliance with medications is 76-100%.   she relates her moods are doing much better. Patient states that she has hearing problems and she wants a referral to a specialist to help with this issue. 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    Objective:   Physical Exam  Lungs clear hearts regular neck no masses      Assessment & Plan:  Anxiety/depression symptoms-much improved on medication continue this for now followup again in 6 months  Referral to ENT because her hearing loss

## 2013-06-15 LAB — LIPID PANEL
Cholesterol: 161 mg/dL (ref 0–200)
HDL: 38 mg/dL — ABNORMAL LOW (ref >39)
LDL CALC: 72 mg/dL (ref 0–99)
Total CHOL/HDL Ratio: 4.2 Ratio
Triglycerides: 255 mg/dL — ABNORMAL HIGH (ref <150)
VLDL: 51 mg/dL — AB (ref 0–40)

## 2013-06-15 LAB — GLUCOSE, RANDOM: Glucose, Bld: 94 mg/dL (ref 70–99)

## 2013-06-19 ENCOUNTER — Encounter: Payer: Self-pay | Admitting: Nurse Practitioner

## 2013-06-19 ENCOUNTER — Ambulatory Visit (INDEPENDENT_AMBULATORY_CARE_PROVIDER_SITE_OTHER): Payer: 59 | Admitting: Nurse Practitioner

## 2013-06-19 VITALS — BP 138/86 | Ht 60.0 in | Wt 166.5 lb

## 2013-06-19 DIAGNOSIS — A499 Bacterial infection, unspecified: Secondary | ICD-10-CM

## 2013-06-19 DIAGNOSIS — N72 Inflammatory disease of cervix uteri: Secondary | ICD-10-CM

## 2013-06-19 DIAGNOSIS — N760 Acute vaginitis: Secondary | ICD-10-CM

## 2013-06-19 DIAGNOSIS — F341 Dysthymic disorder: Secondary | ICD-10-CM

## 2013-06-19 DIAGNOSIS — Z01419 Encounter for gynecological examination (general) (routine) without abnormal findings: Secondary | ICD-10-CM

## 2013-06-19 DIAGNOSIS — B9689 Other specified bacterial agents as the cause of diseases classified elsewhere: Secondary | ICD-10-CM

## 2013-06-19 DIAGNOSIS — Z0000 Encounter for general adult medical examination without abnormal findings: Secondary | ICD-10-CM

## 2013-06-19 DIAGNOSIS — F418 Other specified anxiety disorders: Secondary | ICD-10-CM

## 2013-06-19 LAB — POCT WET PREP WITH KOH
Bacteria Wet Prep HPF POC: POSITIVE
Clue Cells Wet Prep HPF POC: POSITIVE
Epithelial Wet Prep HPF POC: POSITIVE
KOH PREP POC: NEGATIVE
TRICHOMONAS UA: NEGATIVE
pH: 5.5

## 2013-06-19 MED ORDER — METRONIDAZOLE 500 MG PO TABS: 500.0000 mg | ORAL_TABLET | Freq: Two times a day (BID) | ORAL | Status: DC

## 2013-06-20 LAB — GC/CHLAMYDIA PROBE AMP, URINE
Chlamydia, Swab/Urine, PCR: NEGATIVE
GC PROBE AMP, URINE: NEGATIVE

## 2013-06-21 NOTE — Progress Notes (Signed)
Notified patient via VM stating her test results were negative

## 2013-06-22 ENCOUNTER — Encounter: Payer: Self-pay | Admitting: Nurse Practitioner

## 2013-06-22 DIAGNOSIS — F418 Other specified anxiety disorders: Secondary | ICD-10-CM | POA: Insufficient documentation

## 2013-06-22 NOTE — Progress Notes (Signed)
Subjective:    Patient ID: [name], female    DOB: September 28, 1980, 33 y.o.   MRN: [id number]  HPI Presents for wellness check up. Both fallopian tubes have been removed. Had IVF for her one viable pregnancy. Regular cycles lasting about 7 days, normal flow. No change in sexual partners. Had vision exam 2 years ago. Regular dental exams. Experiencing more anxiety lately. Currently on Celexa 20 mg.    Review of Systems  Constitutional: Negative for activity change, appetite change and fatigue.  HENT: Negative for dental problem, ear pain, sinus pressure and sore throat.   Respiratory: Negative for cough, chest tightness, shortness of breath and wheezing.   Cardiovascular: Negative for chest pain and leg swelling.  Gastrointestinal: Negative for nausea, vomiting, abdominal pain, diarrhea, constipation, blood in stool and abdominal distention.  Genitourinary: Negative for dysuria, urgency, frequency, vaginal discharge, enuresis, difficulty urinating, genital sores, menstrual problem and pelvic pain.  Psychiatric/Behavioral: The patient is nervous/anxious.        Objective:   Physical Exam  Vitals reviewed. Constitutional: She is oriented to person, place, and time. She appears well-developed. No distress.  HENT:  Right Ear: External ear normal.  Left Ear: External ear normal.  Mouth/Throat: Oropharynx is clear and moist.  Neck: Normal range of motion. Neck supple. No tracheal deviation present. No thyromegaly present.  Cardiovascular: Normal rate, regular rhythm and normal heart sounds.  Exam reveals no gallop.   No murmur heard. Pulmonary/Chest: Effort normal and breath sounds normal.  Abdominal: Soft. She exhibits no distension. There is no tenderness.  Genitourinary: Vagina normal and uterus normal. No vaginal discharge found.  External GU: no rashes or lesions. Vagina: moderate amount slight yellow mucoid discharge. Cervix mild irritation, no CMT. Bimanual exam: no masses or  tenderness.   Musculoskeletal: She exhibits no edema.  Lymphadenopathy:    She has no cervical adenopathy.  Neurological: She is alert and oriented to person, place, and time.  Skin: Skin is warm and dry. No rash noted.  Psychiatric: She has a normal mood and affect. Her behavior is normal.  Breast exam: dense tissue, mild fine nodularity, no dominant masses; axillae no adenopathy. Wet prep: clue cells, some bacteria, occas WBC and RBC. Ph 5.5. PAP dated 07/11/12 normal with neg high risk HPV.        Assessment & Plan:   Problem List Items Addressed This Visit     Other   Depression with anxiety    Other Visit Diagnoses   Well woman exam    -  Primary    Cervicitis        Relevant Orders       GC/chlamydia probe amp, urine (Completed)       POCT Wet Prep with KOH (Completed)    Bacterial vaginosis        Relevant Medications       metroNIDAZOLE (FLAGYL) tablet    Other Relevant Orders       POCT Wet Prep with KOH (Completed)      Meds ordered this encounter  Medications  . metroNIDAZOLE (FLAGYL) 500 MG tablet    Sig: Take 1 tablet (500 mg total) by mouth 2 (two) times daily with a meal.    Dispense:  14 tablet    Refill:  0    Order Specific Question:  Supervising Provider    Answer:  Merlyn AlbertLUKING, WILLIAM S [[id number]]   Recommend healthy diet, weight loss, regular activity and vitamin D and calcium supplementation. Next PE  in one year.

## 2013-06-29 ENCOUNTER — Ambulatory Visit (INDEPENDENT_AMBULATORY_CARE_PROVIDER_SITE_OTHER): Payer: 59 | Admitting: Otolaryngology

## 2013-06-29 DIAGNOSIS — H903 Sensorineural hearing loss, bilateral: Secondary | ICD-10-CM

## 2013-06-30 ENCOUNTER — Encounter: Payer: Self-pay | Admitting: Nurse Practitioner

## 2013-07-03 ENCOUNTER — Other Ambulatory Visit: Payer: Self-pay | Admitting: Nurse Practitioner

## 2013-07-03 MED ORDER — CITALOPRAM HYDROBROMIDE 20 MG PO TABS: 30.0000 mg | ORAL_TABLET | Freq: Every day | ORAL | Status: DC

## 2013-07-21 ENCOUNTER — Telehealth: Payer: Self-pay | Admitting: Family Medicine

## 2013-07-21 MED ORDER — ETODOLAC 400 MG PO TABS: 400.0000 mg | ORAL_TABLET | Freq: Two times a day (BID) | ORAL | Status: DC | PRN

## 2013-07-21 NOTE — Addendum Note (Signed)
Addended by: Margaretha Sheffield on: 07/21/2013 03:19 PM   Modules accepted: Orders

## 2013-07-21 NOTE — Telephone Encounter (Signed)
Patient states that this has been a chronic off and on problem but she has forgotten to mention it to you. Med sent electronically to pharmacy. Patient advised to schedule office visit to discuss if further problems. Patient verbalized understanding.

## 2013-07-21 NOTE — Telephone Encounter (Signed)
ntsw first, no listing of migr as chronic prob and no recent visit fvor it. Etodolac 400 bid with food prn, o v if persists numb 20 one ref

## 2013-07-21 NOTE — Telephone Encounter (Signed)
Patient has been having a migraine headache on and off this week. She wants to know if there is anything you recommend she takes? She has taken excedrin and ibuprofen.

## 2013-09-07 ENCOUNTER — Telehealth: Payer: Self-pay | Admitting: Family Medicine

## 2013-09-07 DIAGNOSIS — R109 Unspecified abdominal pain: Secondary | ICD-10-CM

## 2013-09-07 LAB — CBC WITH DIFFERENTIAL/PLATELET
BASOS PCT: 0 % (ref 0–1)
Basophils Absolute: 0 10*3/uL (ref 0.0–0.1)
EOS ABS: 0.2 10*3/uL (ref 0.0–0.7)
Eosinophils Relative: 2 % (ref 0–5)
HCT: 37.2 % (ref 36.0–46.0)
Hemoglobin: 12.7 g/dL (ref 12.0–15.0)
LYMPHS ABS: 2.4 10*3/uL (ref 0.7–4.0)
Lymphocytes Relative: 28 % (ref 12–46)
MCH: 28.9 pg (ref 26.0–34.0)
MCHC: 34.1 g/dL (ref 30.0–36.0)
MCV: 84.5 fL (ref 78.0–100.0)
MONOS PCT: 5 % (ref 3–12)
Monocytes Absolute: 0.4 10*3/uL (ref 0.1–1.0)
Neutro Abs: 5.5 10*3/uL (ref 1.7–7.7)
Neutrophils Relative %: 65 % (ref 43–77)
Platelets: 326 10*3/uL (ref 150–400)
RBC: 4.4 MIL/uL (ref 3.87–5.11)
RDW: 14.9 % (ref 11.5–15.5)
WBC: 8.5 10*3/uL (ref 4.0–10.5)

## 2013-09-07 NOTE — Telephone Encounter (Signed)
BW orders are in. Pt notified and verbalized understanding. I transferred pt up front to make appt tomorrow with Dr. Lorin PicketScott.

## 2013-09-07 NOTE — Telephone Encounter (Signed)
Patient has been having stomach pains off and on for a couple of weeks now. She said that it kind of feels like hunger pains, but after she eats it is still there. She said she isn't sure how urgent this is. She has an appointment on 09/14/13, but wants to see if she should be seen sooner?

## 2013-09-07 NOTE — Telephone Encounter (Signed)
Please call patient at work at [phone].

## 2013-09-07 NOTE — Telephone Encounter (Signed)
Patient said the abdominal pain woke her up last night. The pain comes and goes. It is in the center of her abdomen. Eating helps, nothing aggravates it. No fever. She is having soft BM's about 3 times a day. No other S/S.

## 2013-09-07 NOTE — Telephone Encounter (Signed)
Patient needs to do CBC, liver, lipase, amylase, metastases 7. She needs to do this today. Schedule office visit tomorrow. I can see her May also use Maalox 4 times a day when necessary

## 2013-09-08 ENCOUNTER — Ambulatory Visit (INDEPENDENT_AMBULATORY_CARE_PROVIDER_SITE_OTHER): Payer: 59 | Admitting: Family Medicine

## 2013-09-08 ENCOUNTER — Encounter: Payer: Self-pay | Admitting: Family Medicine

## 2013-09-08 VITALS — BP 112/72 | Temp 98.7°F | Ht 60.0 in | Wt 165.2 lb

## 2013-09-08 DIAGNOSIS — K2990 Gastroduodenitis, unspecified, without bleeding: Secondary | ICD-10-CM

## 2013-09-08 DIAGNOSIS — K2970 Gastritis, unspecified, without bleeding: Secondary | ICD-10-CM

## 2013-09-08 DIAGNOSIS — R109 Unspecified abdominal pain: Secondary | ICD-10-CM

## 2013-09-08 LAB — BASIC METABOLIC PANEL
BUN: 10 mg/dL (ref 6–23)
CO2: 25 mEq/L (ref 19–32)
CREATININE: 0.71 mg/dL (ref 0.50–1.10)
Calcium: 8.8 mg/dL (ref 8.4–10.5)
Chloride: 102 mEq/L (ref 96–112)
GLUCOSE: 88 mg/dL (ref 70–99)
Potassium: 4 mEq/L (ref 3.5–5.3)
Sodium: 138 mEq/L (ref 135–145)

## 2013-09-08 LAB — HEPATIC FUNCTION PANEL
ALBUMIN: 4.1 g/dL (ref 3.5–5.2)
ALK PHOS: 41 U/L (ref 39–117)
ALT: 11 U/L (ref 0–35)
AST: 17 U/L (ref 0–37)
Bilirubin, Direct: 0.1 mg/dL (ref 0.0–0.3)
Indirect Bilirubin: 0.3 mg/dL (ref 0.2–1.2)
TOTAL PROTEIN: 6.8 g/dL (ref 6.0–8.3)
Total Bilirubin: 0.4 mg/dL (ref 0.2–1.2)

## 2013-09-08 LAB — LIPASE: Lipase: 29 U/L (ref 0–75)

## 2013-09-08 LAB — AMYLASE: Amylase: 38 U/L (ref 0–105)

## 2013-09-08 MED ORDER — PANTOPRAZOLE SODIUM 40 MG PO TBEC: 40.0000 mg | DELAYED_RELEASE_TABLET | Freq: Every day | ORAL | Status: DC

## 2013-09-08 MED ORDER — CLONAZEPAM 0.5 MG PO TABS: 0.5000 mg | ORAL_TABLET | Freq: Two times a day (BID) | ORAL | Status: DC | PRN

## 2013-09-08 NOTE — Progress Notes (Addendum)
   Subjective:    Patient ID: [name], female    DOB: 30-Jun-1980, 33 y.o.   MRN: [id number]  Abdominal Pain This is a recurrent problem. The current episode started 1 to 4 weeks ago. The problem occurs intermittently. Associated symptoms include nausea. Pertinent negatives include no constipation, frequency or vomiting. She has tried nothing for the symptoms.   No camping No hx of abd pain before this. No travel fam hx- grandparents colon cancer 6160's Lab work was reviewed in detail. Review of Systems  Constitutional: Negative for activity change, appetite change and fatigue.  HENT: Negative for congestion.   Respiratory: Negative for cough and shortness of breath.   Cardiovascular: Negative for chest pain.  Gastrointestinal: Positive for nausea and abdominal pain. Negative for vomiting, constipation and blood in stool.  Endocrine: Negative for polydipsia and polyphagia.  Genitourinary: Negative for frequency.  Neurological: Negative for weakness.  Psychiatric/Behavioral: Negative for confusion.       Objective:   Physical Exam  Lungs clear heart regular pulse normal abdomen soft no guarding rebound or tenderness.      Assessment & Plan:  Gastritis I doubt peptic ulcer acid blocker PPI prescribed alcohol no greater than 2 glasses of wine per day for the next couple weeks it be best to try to stay away altogether. Responsible eating minimize soda intake. Followup in a few weeks if still having symptoms referral to GI for EGD  Intermittent headaches she will keep up with the frequency of this warning signs discussed she will discuss the frequency further when she follows up. Stay away from all NSAIDs

## 2013-09-14 ENCOUNTER — Ambulatory Visit: Payer: 59 | Admitting: Nurse Practitioner

## 2013-10-02 ENCOUNTER — Ambulatory Visit (INDEPENDENT_AMBULATORY_CARE_PROVIDER_SITE_OTHER): Payer: 59 | Admitting: Family Medicine

## 2013-10-02 ENCOUNTER — Encounter: Payer: Self-pay | Admitting: Family Medicine

## 2013-10-02 VITALS — BP 120/82 | Ht 60.0 in | Wt 168.4 lb

## 2013-10-02 DIAGNOSIS — F439 Reaction to severe stress, unspecified: Secondary | ICD-10-CM

## 2013-10-02 DIAGNOSIS — K2970 Gastritis, unspecified, without bleeding: Secondary | ICD-10-CM | POA: Insufficient documentation

## 2013-10-02 DIAGNOSIS — Z639 Problem related to primary support group, unspecified: Secondary | ICD-10-CM

## 2013-10-02 DIAGNOSIS — R51 Headache: Secondary | ICD-10-CM

## 2013-10-02 DIAGNOSIS — K2990 Gastroduodenitis, unspecified, without bleeding: Principal | ICD-10-CM

## 2013-10-02 MED ORDER — CITALOPRAM HYDROBROMIDE 20 MG PO TABS: 30.0000 mg | ORAL_TABLET | Freq: Every day | ORAL | Status: DC

## 2013-10-02 NOTE — Progress Notes (Signed)
   Subjective:    Patient ID: [name], female    DOB: 1981/02/02, 33 y.o.   MRN: [id number]  HPI Patient arrives for a follow up on stomach pain-Patient states she is doing better on Protonix. Patient also wants to discuss decreasing Celexa. Patient relates stomach pain doing much better. She's cut down on line intake. She avoids NSAIDs. One day she did take NSAIDs for a headache it caused stomach pains. She denies any rectal bleeding.  She states that her headaches are minimal.  She is under some stress her and her husband are working harder to try to get along better she has gone some counseling he will not go to counseling. Patient denies being depressed.  Review of Systems  Constitutional: Negative for activity change, appetite change and fatigue.  Respiratory: Negative for shortness of breath.   Cardiovascular: Negative for chest pain.  Gastrointestinal: Negative for abdominal pain.  Endocrine: Negative for polydipsia and polyphagia.  Genitourinary: Negative for frequency.  Neurological: Negative for weakness.  Psychiatric/Behavioral: Negative for confusion.       Objective:   Physical Exam  Vitals reviewed. Constitutional: She appears well-nourished. No distress.  Cardiovascular: Normal rate, regular rhythm and normal heart sounds.   No murmur heard. Pulmonary/Chest: Effort normal and breath sounds normal. No respiratory distress.  Abdominal: Soft. There is no tenderness.  Musculoskeletal: She exhibits no edema.  Lymphadenopathy:    She has no cervical adenopathy.  Neurological: She is alert. She exhibits normal muscle tone.  Psychiatric: Her behavior is normal.          Assessment & Plan:  Headaches-intermittent Tylenol best approach it becomes frequent and then recommend followup, no need for scans currently if it starts causing her to wake up until 9 vomiting then she will need followup.  Stress related issues seems to be doing well on Celexa she will  do some counseling if she wants to but currently has not. She is trying hard in her marriage. Keep Celexa dose the same. She states it is not causing her problems.  Abdominal pain resolved. Continue protonix for the next 4 weeks then do reduce it to every other day for 2 weeks then stop. If ongoing troubles followup.  Followup 6 months

## 2013-12-11 ENCOUNTER — Encounter: Payer: Self-pay | Admitting: Family Medicine

## 2013-12-11 ENCOUNTER — Ambulatory Visit (INDEPENDENT_AMBULATORY_CARE_PROVIDER_SITE_OTHER): Payer: 59 | Admitting: Family Medicine

## 2013-12-11 VITALS — BP 136/94 | Temp 98.9°F | Ht 60.0 in | Wt 169.0 lb

## 2013-12-11 DIAGNOSIS — N3001 Acute cystitis with hematuria: Secondary | ICD-10-CM

## 2013-12-11 DIAGNOSIS — R300 Dysuria: Secondary | ICD-10-CM

## 2013-12-11 LAB — POCT URINALYSIS DIPSTICK
Blood, UA: 250
PH UA: 7
PROTEIN UA: 100
Spec Grav, UA: 1.01

## 2013-12-11 MED ORDER — PHENAZOPYRIDINE HCL 200 MG PO TABS: 200.0000 mg | ORAL_TABLET | Freq: Three times a day (TID) | ORAL | Status: DC | PRN

## 2013-12-11 MED ORDER — CIPROFLOXACIN HCL 500 MG PO TABS: 500.0000 mg | ORAL_TABLET | Freq: Two times a day (BID) | ORAL | Status: AC

## 2013-12-11 NOTE — Progress Notes (Signed)
   Subjective:    Patient ID: [name], female    DOB: 1980-10-04, 33 y.o.   MRN: [id number]  Dysuria  This is a new problem. The current episode started in the past 7 days. The maximum temperature recorded prior to her arrival was 100 - 100.9 F. Associated symptoms include chills, frequency, hematuria and urgency. She has tried increased fluids and acetaminophen for the symptoms.   Started yest, felt bad, and in severe pain  Blood in urine f3ever and chills last night  No recent utis's Pos pain and dysuria, feels like razors  Tylenol,  No significant abdominal pain   Review of Systems  Constitutional: Positive for chills.  Genitourinary: Positive for dysuria, urgency, frequency and hematuria.       Objective:   Physical Exam Alert mild malaise frontal stable lungs clear heart regular in rhythm. No true CVA tenderness  Urinalysis 12-15 white blood cells per high-power field a couple scattered at these       Assessment & Plan:  Impression urinary tract and will cover for upper level involvement with fever and symptomatology plan Cipro twice a day.. Him. Warning signs discussed. WSL

## 2013-12-14 ENCOUNTER — Telehealth: Payer: Self-pay | Admitting: Family Medicine

## 2013-12-14 MED ORDER — FLUCONAZOLE 150 MG PO TABS: ORAL_TABLET | ORAL | Status: DC

## 2013-12-14 NOTE — Telephone Encounter (Signed)
Left message on voicemail notifying patient  

## 2013-12-14 NOTE — Telephone Encounter (Signed)
Pt having yeast issues now after taking the antibiotic, can we call in 2x diflucan for her?   [facility]   Seen her 10/19

## 2013-12-25 ENCOUNTER — Encounter: Payer: Self-pay | Admitting: Family Medicine

## 2014-01-25 ENCOUNTER — Telehealth: Payer: Self-pay | Admitting: Family Medicine

## 2014-01-25 NOTE — Telephone Encounter (Signed)
Patient said that she was at work yesterday when she lost part of her peripheral vision.  It is back now.  She would like a nurse to call back to discuss this.

## 2014-01-25 NOTE — Telephone Encounter (Signed)
Transferred patient to front desk to schedule appointment.  

## 2014-01-26 ENCOUNTER — Encounter: Payer: Self-pay | Admitting: Family Medicine

## 2014-01-26 ENCOUNTER — Ambulatory Visit (INDEPENDENT_AMBULATORY_CARE_PROVIDER_SITE_OTHER): Payer: 59 | Admitting: Family Medicine

## 2014-01-26 VITALS — BP 132/84 | Temp 99.2°F | Ht 60.0 in | Wt 171.0 lb

## 2014-01-26 DIAGNOSIS — R200 Anesthesia of skin: Secondary | ICD-10-CM

## 2014-01-26 DIAGNOSIS — R202 Paresthesia of skin: Secondary | ICD-10-CM

## 2014-01-26 DIAGNOSIS — R519 Headache, unspecified: Secondary | ICD-10-CM

## 2014-01-26 DIAGNOSIS — R51 Headache: Secondary | ICD-10-CM

## 2014-01-26 MED ORDER — PANTOPRAZOLE SODIUM 40 MG PO TBEC: 40.0000 mg | DELAYED_RELEASE_TABLET | Freq: Every day | ORAL | Status: DC

## 2014-01-26 MED ORDER — TOPIRAMATE 25 MG PO TABS: 25.0000 mg | ORAL_TABLET | Freq: Two times a day (BID) | ORAL | Status: DC

## 2014-01-26 NOTE — Progress Notes (Signed)
   Subjective:    Patient ID: [name], female    DOB: 09-23-1980, 33 y.o.   MRN: [id number]  Headache  This is a new problem. The current episode started more than 1 month ago. Episode frequency: at least once weekly. takes tylenol everyday. The quality of the pain is described as throbbing. The pain is severe. Associated symptoms include dizziness, seizures and a visual change. Pertinent negatives include no abdominal pain, coughing or weakness. Treatments tried: tylenol.   Dizziness for awhile. Diagnosed with vertigo.  Low grade fever today.  Occasional tunnwel vision that lasts 15 minutes not associated with the Ha  Review of Systems  Constitutional: Negative for activity change, appetite change and fatigue.  HENT: Negative for congestion.   Respiratory: Negative for cough and choking.   Gastrointestinal: Negative for abdominal pain.  Endocrine: Negative for polydipsia and polyphagia.  Genitourinary: Negative for frequency.  Neurological: Positive for dizziness, seizures, light-headedness and headaches. Negative for weakness.  Psychiatric/Behavioral: Negative for confusion.  no fever no vomiting Wakes up in the am with Renea EeHa Ha 6 days a week Severe Ha 4 times a month Also numbness that comes and goes in hands and feet and sometimes side of face     Objective:   Physical Exam  Constitutional: She appears well-nourished. No distress.  Cardiovascular: Normal rate, regular rhythm and normal heart sounds.   No murmur heard. Pulmonary/Chest: Effort normal and breath sounds normal. No respiratory distress.  Musculoskeletal: She exhibits no edema.  Lymphadenopathy:    She has no cervical adenopathy.  Neurological: She is alert. She exhibits normal muscle tone.  Psychiatric: Her behavior is normal.  Vitals reviewed.  Neuro exam ok, EOMI, pupils nl, rhomberg neg,         Assessment & Plan:  Severe ha with worsening pattern  Needs to reduce tylenol use  start  topamax MRI ordered to r/o MS bcz of vision issues and atypical numbeness patterns  Also stress levels high may be playing a role We discussed stress reducing strategies

## 2014-02-01 ENCOUNTER — Encounter (HOSPITAL_COMMUNITY): Payer: Self-pay

## 2014-02-01 ENCOUNTER — Ambulatory Visit (HOSPITAL_COMMUNITY)
Admission: RE | Admit: 2014-02-01 | Discharge: 2014-02-01 | Disposition: A | Discharge status: Home / Self Care | Payer: 59 | Source: Ambulatory Visit | Attending: Family Medicine | Admitting: Family Medicine

## 2014-02-01 DIAGNOSIS — H547 Unspecified visual loss: Secondary | ICD-10-CM | POA: Insufficient documentation

## 2014-02-01 DIAGNOSIS — R42 Dizziness and giddiness: Secondary | ICD-10-CM | POA: Insufficient documentation

## 2014-02-01 DIAGNOSIS — R51 Headache: Secondary | ICD-10-CM | POA: Insufficient documentation

## 2014-02-12 ENCOUNTER — Ambulatory Visit (INDEPENDENT_AMBULATORY_CARE_PROVIDER_SITE_OTHER): Payer: 59 | Admitting: Nurse Practitioner

## 2014-02-12 ENCOUNTER — Encounter: Payer: Self-pay | Admitting: Nurse Practitioner

## 2014-02-12 VITALS — BP 122/86 | Temp 98.6°F | Ht 60.0 in | Wt 169.0 lb

## 2014-02-12 DIAGNOSIS — J310 Chronic rhinitis: Secondary | ICD-10-CM

## 2014-02-12 DIAGNOSIS — J329 Chronic sinusitis, unspecified: Secondary | ICD-10-CM

## 2014-02-12 MED ORDER — PREDNISONE 20 MG PO TABS: ORAL_TABLET | ORAL | Status: DC

## 2014-02-12 MED ORDER — LEVOFLOXACIN 500 MG PO TABS: 500.0000 mg | ORAL_TABLET | Freq: Every day | ORAL | Status: DC

## 2014-02-12 MED ORDER — METHYLPREDNISOLONE ACETATE 40 MG/ML IJ SUSP
40.0000 mg | Freq: Once | INTRAMUSCULAR | Status: AC
Start: 2014-02-12 — End: 2014-02-12
  Administered 2014-02-12: 40 mg via INTRAMUSCULAR

## 2014-02-12 MED ORDER — HYDROCODONE-HOMATROPINE 5-1.5 MG/5ML PO SYRP: 5.0000 mL | ORAL_SOLUTION | ORAL | Status: DC | PRN

## 2014-02-12 NOTE — Progress Notes (Signed)
Subjective:  Presents complaints of cough and congestion that began on 12/10. Was slightly better than symptoms became worse on 12/17. No fever. Intense left facial area headache. Occasional productive cough. Green nasal drainage. Left ear pain. No sore throat. Nonsmoker. Denies possibility of pregnancy.  Objective:   BP 122/86 mmHg  Temp(Src) 98.6 F (37 C)  Ht 5' (1.524 m)  Wt 169 lb (76.658 kg)  BMI 33.01 kg/m2  LMP 01/25/2014 NAD. Alert, oriented. Right TM normal limit. Left TM significant clear effusion. Pharynx minimally injected with green PND noted. Neck supple with mild soft anterior adenopathy. Lungs clear. Heart regular rate rhythm.  Assessment:Rhinosinusitis - Plan: methylPREDNISolone acetate (DEPO-MEDROL) injection 40 mg  Plan: Meds ordered this encounter  Medications  . levofloxacin (LEVAQUIN) 500 MG tablet    Sig: Take 1 tablet (500 mg total) by mouth daily.    Dispense:  10 tablet    Refill:  0    Order Specific Question:  Supervising Provider    Answer:  Merlyn AlbertLUKING, WILLIAM S [[id number]]  . predniSONE (DELTASONE) 20 MG tablet    Sig: 2 po qd x 5 d    Dispense:  10 tablet    Refill:  0    Order Specific Question:  Supervising Provider    Answer:  Merlyn AlbertLUKING, WILLIAM S [[id number]]  . HYDROcodone-homatropine (HYCODAN) 5-1.5 MG/5ML syrup    Sig: Take 5 mLs by mouth every 4 (four) hours as needed.    Dispense:  120 mL    Refill:  0    Order Specific Question:  Supervising Provider    Answer:  Merlyn AlbertLUKING, WILLIAM S [[id number]]  . methylPREDNISolone acetate (DEPO-MEDROL) injection 40 mg    Sig:    Given written prescription for prednisone to start in 48 hours if no improvement in sinus pressure. OTC meds as directed. Call back if worsens or persists.

## 2014-04-03 ENCOUNTER — Encounter: Payer: Self-pay | Admitting: Family Medicine

## 2014-04-03 ENCOUNTER — Ambulatory Visit (INDEPENDENT_AMBULATORY_CARE_PROVIDER_SITE_OTHER): Payer: 59 | Admitting: Family Medicine

## 2014-04-03 VITALS — BP 132/80 | Temp 98.9°F | Ht 60.0 in | Wt 158.0 lb

## 2014-04-03 DIAGNOSIS — J310 Chronic rhinitis: Secondary | ICD-10-CM

## 2014-04-03 DIAGNOSIS — J329 Chronic sinusitis, unspecified: Secondary | ICD-10-CM

## 2014-04-03 MED ORDER — AZITHROMYCIN 250 MG PO TABS: ORAL_TABLET | ORAL | Status: DC

## 2014-04-03 MED ORDER — BENZONATATE 100 MG PO CAPS: 100.0000 mg | ORAL_CAPSULE | Freq: Four times a day (QID) | ORAL | Status: DC | PRN

## 2014-04-03 NOTE — Progress Notes (Signed)
   Subjective:    Patient ID: [name], female    DOB: [birth year], 34 y.o.   MRN: [id number]  Cough This is a new problem. The current episode started in the past 7 days. Associated symptoms include a sore throat. Associated symptoms comments: Loss of voice. She has tried nothing for the symptoms.   Cough started sat no other symptoms  Voice was gone now barely audible  trhoat sore    Some drainage with nasal discharge.  Nonsmoker  Some ha yest  No otc meds,  No one else sick at home     Review of Systems  HENT: Positive for sore throat.   Respiratory: Positive for cough.    no vomiting no diarrhea no rash ROS otherwise negative     Objective:   Physical Exam   Alert hydration good. HEENT moderate nasal congestion pharynx erythematous tender cervical lymph nodes lungs clear heart regular in rhythm.     Assessment & Plan:  Impression rhinosinusitis/lymphadenitis plan antibiotics prescribed. Symptomatic care discussed. Warning signs discussed. WSL

## 2014-04-10 ENCOUNTER — Encounter (HOSPITAL_COMMUNITY): Payer: Self-pay | Admitting: Emergency Medicine

## 2014-04-10 ENCOUNTER — Emergency Department (HOSPITAL_COMMUNITY)
Admission: EM | Admit: 2014-04-10 | Discharge: 2014-04-10 | Discharge status: Against Medical Advice | Payer: 59 | Attending: Emergency Medicine | Admitting: Emergency Medicine

## 2014-04-10 DIAGNOSIS — F410 Panic disorder [episodic paroxysmal anxiety] without agoraphobia: Secondary | ICD-10-CM | POA: Insufficient documentation

## 2014-04-10 HISTORY — DX: Major depressive disorder, single episode, unspecified: F32.9

## 2014-04-10 HISTORY — DX: Depression, unspecified: F32.A

## 2014-04-10 HISTORY — DX: Anxiety disorder, unspecified: F41.9

## 2014-04-10 NOTE — ED Notes (Signed)
PT states hx of anxiety and started having a panic attack around 1700 this evening. PT states she has been taking her Celexa as normal but has had increased stressors recently.

## 2014-04-16 ENCOUNTER — Ambulatory Visit (INDEPENDENT_AMBULATORY_CARE_PROVIDER_SITE_OTHER): Payer: 59 | Admitting: Family Medicine

## 2014-04-16 ENCOUNTER — Encounter: Payer: Self-pay | Admitting: Family Medicine

## 2014-04-16 VITALS — BP 132/88 | Temp 99.2°F | Ht 60.0 in | Wt 163.0 lb

## 2014-04-16 DIAGNOSIS — J019 Acute sinusitis, unspecified: Secondary | ICD-10-CM

## 2014-04-16 DIAGNOSIS — B9689 Other specified bacterial agents as the cause of diseases classified elsewhere: Secondary | ICD-10-CM

## 2014-04-16 MED ORDER — CEFPROZIL 500 MG PO TABS: 500.0000 mg | ORAL_TABLET | Freq: Two times a day (BID) | ORAL | Status: DC

## 2014-04-16 MED ORDER — HYDROCODONE-HOMATROPINE 5-1.5 MG/5ML PO SYRP: 5.0000 mL | ORAL_SOLUTION | Freq: Four times a day (QID) | ORAL | Status: DC | PRN

## 2014-04-16 NOTE — Progress Notes (Signed)
   Subjective:    Patient ID: [name], female    DOB: 05/05/1980, 34 y.o.   MRN: [id number]  Cough This is a new problem. The current episode started 1 to 4 weeks ago. Associated symptoms include rhinorrhea. Pertinent negatives include no chest pain, ear pain, fever, shortness of breath or wheezing. Treatments tried: zpack, tessalon pearls. The treatment provided no relief.    Patient*to off with head congestion drainage coughing was seen couple weeks ago treated with Zithromax never really got better and had progressive troubles over the past couple weeks  Review of Systems  Constitutional: Negative for fever and activity change.  HENT: Positive for congestion and rhinorrhea. Negative for ear pain.   Eyes: Negative for discharge.  Respiratory: Positive for cough. Negative for shortness of breath and wheezing.   Cardiovascular: Negative for chest pain.       Objective:   Physical Exam  Constitutional: She appears well-developed.  HENT:  Head: Normocephalic.  Nose: Nose normal.  Mouth/Throat: Oropharynx is clear and moist. No oropharyngeal exudate.  Neck: Neck supple.  Cardiovascular: Normal rate and normal heart sounds.   No murmur heard. Pulmonary/Chest: Effort normal and breath sounds normal. She has no wheezes.  Lymphadenopathy:    She has no cervical adenopathy.  Skin: Skin is warm and dry.  Nursing note and vitals reviewed.         Assessment & Plan:  Viral URI Secondary sinusitis Antibiotics prescribed Warning signs discussed.

## 2014-05-14 ENCOUNTER — Encounter: Payer: Self-pay | Admitting: Family Medicine

## 2014-05-14 ENCOUNTER — Ambulatory Visit (INDEPENDENT_AMBULATORY_CARE_PROVIDER_SITE_OTHER): Payer: 59 | Admitting: Family Medicine

## 2014-05-14 VITALS — BP 126/84 | Temp 98.9°F | Ht 60.0 in | Wt 163.0 lb

## 2014-05-14 DIAGNOSIS — J029 Acute pharyngitis, unspecified: Secondary | ICD-10-CM

## 2014-05-14 DIAGNOSIS — H9202 Otalgia, left ear: Secondary | ICD-10-CM | POA: Diagnosis not present

## 2014-05-14 NOTE — Progress Notes (Signed)
   Subjective:    Patient ID: [name], female    DOB: 08-25-1980, 34 y.o.   MRN: [id number]  Otalgia  There is pain in the left ear. The current episode started in the past 7 days. There has been no fever (had chills). The pain is at a severity of 8/10. The pain is severe. Associated symptoms include coughing, rhinorrhea and a sore throat. Associated symptoms comments: Runny nose. Treatments tried: cefzil. The treatment provided mild relief. There is no history of hearing loss.   PMH as had some stress issues recently   Review of Systems  Constitutional: Negative for fever and activity change.  HENT: Positive for congestion, ear pain, rhinorrhea and sore throat.   Eyes: Negative for discharge.  Respiratory: Positive for cough. Negative for shortness of breath and wheezing.   Cardiovascular: Negative for chest pain.       Objective:   Physical Exam  Constitutional: She appears well-developed.  HENT:  Head: Normocephalic.  Nose: Nose normal.  Mouth/Throat: Oropharynx is clear and moist. No oropharyngeal exudate.  Neck: Neck supple.  Cardiovascular: Normal rate and normal heart sounds.   No murmur heard. Pulmonary/Chest: Effort normal and breath sounds normal. She has no wheezes.  Lymphadenopathy:    She has no cervical adenopathy.  Skin: Skin is warm and dry.  Nursing note and vitals reviewed.         Assessment & Plan:  Viral URI Otalgia referred from the throat and sinus Antibiotics prescribed Warning signs discuss Stress issues still present but patient managing better

## 2014-07-02 ENCOUNTER — Ambulatory Visit (INDEPENDENT_AMBULATORY_CARE_PROVIDER_SITE_OTHER): Payer: 59 | Admitting: Family Medicine

## 2014-07-02 ENCOUNTER — Encounter: Payer: Self-pay | Admitting: Family Medicine

## 2014-07-02 VITALS — BP 132/86 | Temp 98.2°F | Ht 60.0 in | Wt 169.0 lb

## 2014-07-02 DIAGNOSIS — M791 Myalgia: Secondary | ICD-10-CM | POA: Diagnosis not present

## 2014-07-02 DIAGNOSIS — H9201 Otalgia, right ear: Secondary | ICD-10-CM | POA: Diagnosis not present

## 2014-07-02 DIAGNOSIS — M7918 Myalgia, other site: Secondary | ICD-10-CM

## 2014-07-02 MED ORDER — DICLOFENAC SODIUM 75 MG PO TBEC: 75.0000 mg | DELAYED_RELEASE_TABLET | Freq: Two times a day (BID) | ORAL | Status: DC

## 2014-07-02 NOTE — Progress Notes (Signed)
   Subjective:    Patient ID: [name], female    DOB: [birth year], 34 y.o.   MRN: [id number]  Otalgia  There is pain in the right ear. This is a new problem. The current episode started today. She has tried nothing for the symptoms.   patient states at times she feels like her heads in a barrel on the right side she denies any excessive drainage coughing nausea vomiting sweats or chills no fever. We tried doing a hearing screen it was abnormal foci patient states she doesn't hear well to begin with    Review of Systems  HENT: Positive for ear pain.        Objective:   Physical Exam  There is no sign of any fluid eardrum minimal redness and ear canal there is no sign of otitis externa. I don't see any drainage there is some tenderness in the TMJ region no temporal tenderness. Throat is normal neck is supple      Assessment & Plan:  Right otalgia-eardrum itself looks good it's hard to understand what is causing this I think it's more likely inflammation of the TMJ on the right side of recommend anti-inflammatory over the next 1-2 weeks if that is not getting better then I recommend referral to ENT.  There may be some eustachian tube dysfunction because patient states at times she hears herself on the right side like she is talking in a barrel

## 2014-07-02 NOTE — Patient Instructions (Signed)
May reduce celexa to 1 tablet daily  In 4 weeks then reduce to 1/2 tablet for 2 weeks then may stop    As for your ear if symptoms not well with anti inflammatory over the next 7 days then please let me know ( may need ENT  eval)

## 2014-07-05 ENCOUNTER — Ambulatory Visit (INDEPENDENT_AMBULATORY_CARE_PROVIDER_SITE_OTHER): Payer: 59 | Admitting: Otolaryngology

## 2014-10-16 ENCOUNTER — Ambulatory Visit (INDEPENDENT_AMBULATORY_CARE_PROVIDER_SITE_OTHER): Payer: 59 | Admitting: Family Medicine

## 2014-10-16 ENCOUNTER — Encounter: Payer: Self-pay | Admitting: Family Medicine

## 2014-10-16 VITALS — BP 136/82 | Ht 60.0 in | Wt 173.0 lb

## 2014-10-16 DIAGNOSIS — R002 Palpitations: Secondary | ICD-10-CM | POA: Diagnosis not present

## 2014-10-16 DIAGNOSIS — F418 Other specified anxiety disorders: Secondary | ICD-10-CM

## 2014-10-16 MED ORDER — CLONAZEPAM 0.5 MG PO TABS: ORAL_TABLET | ORAL | Status: DC

## 2014-10-16 MED ORDER — CITALOPRAM HYDROBROMIDE 10 MG PO TABS: 10.0000 mg | ORAL_TABLET | Freq: Every day | ORAL | Status: DC

## 2014-10-16 NOTE — Progress Notes (Signed)
   Subjective:    Patient ID: [name], female    DOB: 01/04/1981, 34 y.o.   MRN: [id number]  Anxiety Presents for initial visit. Onset was 1 to 5 years ago. The problem has been unchanged. Symptoms occur occasionally. The severity of symptoms is interfering with daily activities. Nothing aggravates the symptoms. The quality of sleep is good. Nighttime awakenings: none.   There are no known risk factors. Compliance with prior treatments has been good.   Patient states that she has no other concerns at this time.  At times patient has palpitations comes and goes only last a few beats at a time nothing severe no chest tightness pressure pain or shortness of breath  She does relate feeling anxious at times at times depressed other times angry she relates she has difficult time controlling this at times. She is interested in starting some counseling she does relate that she is having marriage troubles in she states that her husband hopefully will start counseling with her. She denies being suicidal or homicidal.  Review of Systems     Objective:   Physical Exam  Lungs clear heart regular pulse normal      Assessment & Plan:  Depression/anxiety-I believe this patient would benefit with counseling I've encouraged her to get going on this. She states she will use the psychologist through her workplace. She agrees to restart medication. After discussion it was agreed upon to use lower dose Celexa 10 mg daily. Patient was encouraged to follow-up if progressive troubles otherwise we will see her back in approximately 2-3 weeks. Patient not suicidal.  Palpitations probably related to caffeine avoid caffeine regular exercise healthy eating recommended

## 2014-11-05 ENCOUNTER — Ambulatory Visit: Payer: 59 | Admitting: Family Medicine

## 2015-01-28 ENCOUNTER — Encounter: Payer: Self-pay | Admitting: Adult Health

## 2015-01-28 ENCOUNTER — Other Ambulatory Visit: Payer: 59 | Admitting: Adult Health

## 2015-04-04 MED FILL — CITALOPRAM HBR 10 MG TABLET: 10 | 30 days supply | Qty: 30 | Fill #0

## 2015-04-11 ENCOUNTER — Encounter: Payer: Self-pay | Admitting: Nurse Practitioner

## 2015-04-11 ENCOUNTER — Ambulatory Visit (INDEPENDENT_AMBULATORY_CARE_PROVIDER_SITE_OTHER): Payer: 59 | Admitting: Nurse Practitioner

## 2015-04-11 VITALS — BP 126/88 | Temp 98.6°F | Ht 59.0 in | Wt 168.0 lb

## 2015-04-11 DIAGNOSIS — N760 Acute vaginitis: Secondary | ICD-10-CM

## 2015-04-11 DIAGNOSIS — A599 Trichomoniasis, unspecified: Secondary | ICD-10-CM

## 2015-04-11 DIAGNOSIS — Z113 Encounter for screening for infections with a predominantly sexual mode of transmission: Secondary | ICD-10-CM

## 2015-04-11 LAB — POCT WET PREP WITH KOH
KOH Prep POC: NEGATIVE
RBC WET PREP PER HPF POC: NEGATIVE
Trichomonas, UA: POSITIVE
Yeast Wet Prep HPF POC: NEGATIVE
pH, Wet Prep: 5.5

## 2015-04-11 MED ORDER — METRONIDAZOLE 500 MG PO TABS: ORAL_TABLET | ORAL | Status: DC

## 2015-04-11 MED FILL — metroNIDAZOLE 500 MG TABS: 500 | 1 days supply | Qty: 4 | Fill #0

## 2015-04-12 ENCOUNTER — Telehealth: Payer: Self-pay | Admitting: Family Medicine

## 2015-04-12 ENCOUNTER — Encounter: Payer: Self-pay | Admitting: Nurse Practitioner

## 2015-04-12 LAB — HSV(HERPES SIMPLEX VRS) I + II AB-IGG: HSV 1 Glycoprotein G Ab, IgG: 11.1 index — ABNORMAL HIGH (ref 0.00–0.90)

## 2015-04-12 LAB — RPR: RPR Ser Ql: NONREACTIVE

## 2015-04-12 LAB — HIV ANTIBODY (ROUTINE TESTING W REFLEX): HIV SCREEN 4TH GENERATION: NONREACTIVE

## 2015-04-12 LAB — HEPATITIS C ANTIBODY: Hep C Virus Ab: <0.1 s/co ratio (ref 0.0–0.9)

## 2015-04-12 NOTE — Progress Notes (Signed)
Subjective:  Presents with complaints of vaginal irritation over the past 2 weeks. Gets regular preventive health physicals with gynecology. Has noticed a discharge with slight odor, no color. Mild tenderness in the vaginal area but no itching or burning. Has had a tubal ligation for birth control, cycles are regular. No fever. No pelvic pain. No urinary symptoms. No nausea vomiting. Is married but also has another sexual partner for the past month. Also mentions her husband has had a recurrent "sore" on his penis at times. Patient has not noticed any rash or lesions on her private area.  Objective:   BP 126/88 mmHg  Temp(Src) 98.6 F (37 C) (Oral)  Ht  (1.499 m)  Wt 168 lb (76.204 kg)  BMI 33.91 kg/m2 NAD. Alert, oriented. Lungs clear. Heart regular rate rhythm. Abdomen soft nondistended nontender. External GU mild external irritation at the introitus, no lesions or rash. Vagina small amount of thin slightly yellowish mucoid discharge, no odor noted. Cervix normal in appearance, GC and Chlamydia culture obtained. No CMT. Bimanual exam uterus and adnexa no tenderness or obvious masses. Wet prep is positive for trichomonas.  Assessment: Vaginitis and vulvovaginitis - Plan: POCT Wet Prep with KOH  Screen for STD (sexually transmitted disease) - Plan: HSV(herpes simplex vrs) 1+2 ab-IgG, RPR, HIV antibody, Hepatitis C antibody, GC/Chlamydia Probe Amp  Trichomonal infection  Plan:  Meds ordered this encounter  Medications  . metroNIDAZOLE (FLAGYL) 500 MG tablet    Sig: 4 po x 1 dose    Dispense:  4 tablet    Refill:  0    Order Specific Question:  Supervising Provider    Answer:  Merlyn Albert [[id number]]   No alcohol while taking metronidazole. Discussed safe sex issues at length. Further STD testing pending. Call back if symptoms persist. Also advised patient to get all sexual partners treated or she could become reinfected.

## 2015-04-12 NOTE — Telephone Encounter (Signed)
Discussed with patient. Patient advised All sexual partners need to be treated. The dog or other pets should be fine. Patient verbalized understanding.

## 2015-04-12 NOTE — Telephone Encounter (Signed)
Message for [name]) patient was seen yesterday with vaginal infection and wanting to know if husband and sexual partner need to be treated and her dog because he gets  Into her tampons. [phone]

## 2015-04-12 NOTE — Telephone Encounter (Signed)
All sexual partners need to be treated. The dog or other pets should be fine.

## 2015-04-13 LAB — GC/CHLAMYDIA PROBE AMP
Chlamydia trachomatis, NAA: NEGATIVE
Neisseria gonorrhoeae by PCR: NEGATIVE

## 2015-04-23 ENCOUNTER — Other Ambulatory Visit: Payer: Self-pay | Admitting: Family Medicine

## 2015-04-23 MED FILL — PANTOPRAZOLE SOD DR 40 MG T: 40 | 90 days supply | Qty: 90 | Fill #0

## 2015-06-07 ENCOUNTER — Encounter: Payer: Self-pay | Admitting: Family Medicine

## 2015-06-07 ENCOUNTER — Ambulatory Visit (INDEPENDENT_AMBULATORY_CARE_PROVIDER_SITE_OTHER): Payer: 59 | Admitting: Family Medicine

## 2015-06-07 VITALS — BP 110/76 | Temp 98.9°F | Ht 59.0 in | Wt 165.0 lb

## 2015-06-07 DIAGNOSIS — J329 Chronic sinusitis, unspecified: Secondary | ICD-10-CM

## 2015-06-07 DIAGNOSIS — J310 Chronic rhinitis: Secondary | ICD-10-CM

## 2015-06-07 MED ORDER — AMOXICILLIN-POT CLAVULANATE 875-125 MG PO TABS: 1.0000 | ORAL_TABLET | Freq: Two times a day (BID) | ORAL | Status: DC

## 2015-06-07 NOTE — Progress Notes (Signed)
   Subjective:    Patient ID: [name], female    DOB: Apr 24, 1980, 35 y.o.   MRN: [id number]  Sinusitis This is a new problem. Episode onset: one week. Associated symptoms include ear pain and headaches. Treatments tried: tylenol, aleve.   Started about a week or so ago, had a migraine ten d ago   Usually hhas migraines with mense cycles  Right side of face and head hurting took aleave and  tyl    diminished energy in the last few days  No draage no cong   Sinus right ear pain  Took tyl and aleave   Dep in the eye ,incr    Review of Systems  HENT: Positive for ear pain.   Neurological: Positive for headaches.       Objective:   Physical Exam  Alert, mild malaise. Hydration good Vitals stable. frontal/ maxillary tenderness evident positive nasal congestion. pharynx normal neck supple  lungs clear/no crackles or wheezes. heart regular in rhythm       Assessment & Plan:  Impression rhinosinusitis likely post viral, discussed with patient. plan antibiotics prescribed. Questions answered. Symptomatic care discussed. warning signs discussed. WSL

## 2015-06-10 ENCOUNTER — Ambulatory Visit (INDEPENDENT_AMBULATORY_CARE_PROVIDER_SITE_OTHER): Payer: 59 | Admitting: Nurse Practitioner

## 2015-06-10 ENCOUNTER — Encounter: Payer: Self-pay | Admitting: Nurse Practitioner

## 2015-06-10 VITALS — BP 120/82 | Temp 99.3°F | Ht 59.0 in | Wt 164.1 lb

## 2015-06-10 DIAGNOSIS — N390 Urinary tract infection, site not specified: Secondary | ICD-10-CM | POA: Diagnosis not present

## 2015-06-10 LAB — POCT UA - MICROSCOPIC ONLY: BACTERIA, U MICROSCOPIC: NEGATIVE

## 2015-06-10 NOTE — Progress Notes (Signed)
Subjective:  Presents for c/o pain with urination, urgency and frequency that began at 4 am this morning. No fever. Pressure with slight back pain. Some relief with AZO. Slight nausea, no vomiting. Same sexual partner. No vaginal discharge. No history of recent UTI. This afternoon started Augmentin given at last visit.   Objective:   BP 120/82 mmHg  Temp(Src) 99.3 F (37.4 C) (Oral)  Ht 4\' 11"  (1.499 m)  Wt 164 lb 2 oz (74.447 kg)  BMI 33.13 kg/m2 NAD. Alert, oriented. Lungs clear. Heart RRR. Abdomen soft, non distended with mild suprapubic area tenderness. Results for orders placed or performed in visit on 06/10/15  POCT UA - Microscopic Only  Result Value Ref Range   WBC, Ur, HPF, POC 1-5    RBC, urine, microscopic 0-3    Bacteria, U Microscopic neg    Mucus, UA     Epithelial cells, urine per micros rare    Crystals, Ur, HPF, POC     Casts, Ur, LPF, POC     Yeast, UA       Assessment: Urinary tract infection, site not specified - Plan: POCT UA - Microscopic Only, Urine Culture  Plan: complete Augmentin as directed. Continue AZO as directed for 48 hours then DC. Warning signs reviewed. Call back in 48-72 hours if no improvement, sooner if worse.

## 2015-06-12 ENCOUNTER — Telehealth: Payer: Self-pay | Admitting: Family Medicine

## 2015-06-12 LAB — URINE CULTURE: Organism ID, Bacteria: NO GROWTH

## 2015-06-12 MED ORDER — FLUCONAZOLE 150 MG PO TABS: ORAL_TABLET | ORAL | Status: DC

## 2015-06-12 MED ORDER — CEFPROZIL 500 MG PO TABS: ORAL_TABLET | ORAL | Status: DC

## 2015-06-12 NOTE — Telephone Encounter (Signed)
Spoke with patient and informed her per Dr.Scott Luking- Diflucan 150 mg take 1 now, take a second one in 1 week,  would recommend stopping the Augmentin because of the abdominal pains. . #3-I would recommend Cefzil 500 mg 1 twice a day for 7 days for sinus to finish out. Patient verbalized understanding.

## 2015-06-12 NOTE — Telephone Encounter (Signed)
Pt is calling to ask for diflucan x2 for a yeast infection post antibiotic use  She also states amoxicillin-clavulanate (AUGMENTIN) 875-125 MG tablet  That was issued was ok first day, each day there after she has had increase abd  Pains. She states she does not want to change the med just wanted you to be aware. Advised you may feel its best to change and they would call her back later if further Questions or concerns.     Outpatient [facility]

## 2015-06-12 NOTE — Addendum Note (Signed)
Addended by: Theodora BlowREWS, Clenton Esper R on: 06/12/2015 01:28 PM   Modules accepted: Orders

## 2015-06-12 NOTE — Telephone Encounter (Signed)
#  1 Diflucan 150 mg take 1 now, take a second one in 1 week, #2, 3 refills- #2-I would recommend stopping the Augmentin because of the abdominal pains. Please list this under allergies as a side effect. #3-I would recommend Cefzil 500 mg 1 twice a day for 7 days for sinus to finish out. #4-if ongoing troubles let us know

## 2015-06-12 NOTE — Addendum Note (Signed)
Addended by: Theodora BlowREWS, Sandara Tyree R on: 06/12/2015 01:26 PM   Modules accepted: Orders

## 2015-06-13 MED FILL — CEFPROZIL 500 MG TABLET: 500 | 7 days supply | Qty: 14 | Fill #0

## 2015-06-13 MED FILL — FLUCONAZOLE 150 MG TABLET: 150 | 14 days supply | Qty: 2 | Fill #0

## 2015-06-14 ENCOUNTER — Encounter: Payer: Self-pay | Admitting: Nurse Practitioner

## 2015-06-20 ENCOUNTER — Telehealth: Payer: Self-pay | Admitting: Nurse Practitioner

## 2015-06-20 NOTE — Telephone Encounter (Signed)
error 

## 2015-06-28 ENCOUNTER — Emergency Department (HOSPITAL_COMMUNITY): Payer: 59

## 2015-06-28 ENCOUNTER — Encounter (HOSPITAL_COMMUNITY): Payer: Self-pay | Admitting: Emergency Medicine

## 2015-06-28 ENCOUNTER — Emergency Department (HOSPITAL_COMMUNITY)
Admission: EM | Admit: 2015-06-28 | Discharge: 2015-06-28 | Disposition: A | Discharge status: Home / Self Care | Payer: 59 | Attending: Emergency Medicine | Admitting: Emergency Medicine

## 2015-06-28 DIAGNOSIS — R0602 Shortness of breath: Secondary | ICD-10-CM | POA: Diagnosis not present

## 2015-06-28 DIAGNOSIS — R079 Chest pain, unspecified: Secondary | ICD-10-CM | POA: Diagnosis not present

## 2015-06-28 DIAGNOSIS — F329 Major depressive disorder, single episode, unspecified: Secondary | ICD-10-CM | POA: Diagnosis not present

## 2015-06-28 DIAGNOSIS — Z79899 Other long term (current) drug therapy: Secondary | ICD-10-CM | POA: Insufficient documentation

## 2015-06-28 LAB — D-DIMER, QUANTITATIVE (NOT AT ARMC): D DIMER QUANT: 0.38 ug{FEU}/mL (ref 0.00–0.50)

## 2015-06-28 LAB — CBC WITH DIFFERENTIAL/PLATELET
Basophils Absolute: 0 10*3/uL (ref 0.0–0.1)
Basophils Relative: 0 %
EOS PCT: 1 %
Eosinophils Absolute: 0.1 10*3/uL (ref 0.0–0.7)
HEMATOCRIT: 39.7 % (ref 36.0–46.0)
Hemoglobin: 12.8 g/dL (ref 12.0–15.0)
LYMPHS ABS: 2 10*3/uL (ref 0.7–4.0)
LYMPHS PCT: 28 %
MCH: 27.9 pg (ref 26.0–34.0)
MCHC: 32.2 g/dL (ref 30.0–36.0)
MCV: 86.5 fL (ref 78.0–100.0)
MONO ABS: 0.5 10*3/uL (ref 0.1–1.0)
Monocytes Relative: 7 %
Neutro Abs: 4.6 10*3/uL (ref 1.7–7.7)
Neutrophils Relative %: 64 %
PLATELETS: 362 10*3/uL (ref 150–400)
RBC: 4.59 MIL/uL (ref 3.87–5.11)
RDW: 15 % (ref 11.5–15.5)
WBC: 7.1 10*3/uL (ref 4.0–10.5)

## 2015-06-28 LAB — COMPREHENSIVE METABOLIC PANEL
ALT: 14 U/L (ref 14–54)
AST: 19 U/L (ref 15–41)
Albumin: 4 g/dL (ref 3.5–5.0)
Alkaline Phosphatase: 50 U/L (ref 38–126)
Anion gap: 9 (ref 5–15)
BILIRUBIN TOTAL: 0.3 mg/dL (ref 0.3–1.2)
BUN: 11 mg/dL (ref 6–20)
CHLORIDE: 105 mmol/L (ref 101–111)
CO2: 24 mmol/L (ref 22–32)
Calcium: 8.7 mg/dL — ABNORMAL LOW (ref 8.9–10.3)
Creatinine, Ser: 0.78 mg/dL (ref 0.44–1.00)
Glucose, Bld: 113 mg/dL — ABNORMAL HIGH (ref 65–99)
Potassium: 3.5 mmol/L (ref 3.5–5.1)
Sodium: 138 mmol/L (ref 135–145)
Total Protein: 7.5 g/dL (ref 6.5–8.1)

## 2015-06-28 LAB — TROPONIN I: Troponin I: <0.03 ng/mL (ref <0.031)

## 2015-06-28 LAB — I-STAT BETA HCG BLOOD, ED (MC, WL, AP ONLY)

## 2015-06-28 LAB — LIPASE, BLOOD: Lipase: 29 U/L (ref 11–51)

## 2015-06-28 MED ORDER — PANTOPRAZOLE SODIUM 40 MG IV SOLR
40.0000 mg | Freq: Once | INTRAVENOUS | Status: AC
  Administered 2015-06-28: 40 mg via INTRAVENOUS
  Filled 2015-06-28: qty 40

## 2015-06-28 MED ORDER — GI COCKTAIL ~~LOC~~
30.0000 mL | Freq: Once | ORAL | Status: AC
  Administered 2015-06-28: 30 mL via ORAL
  Filled 2015-06-28: qty 30

## 2015-06-28 MED ORDER — ONDANSETRON HCL 4 MG/2ML IJ SOLN
4.0000 mg | Freq: Once | INTRAMUSCULAR | Status: AC
  Administered 2015-06-28: 4 mg via INTRAVENOUS
  Filled 2015-06-28: qty 2

## 2015-06-28 MED ORDER — LORAZEPAM 2 MG/ML IJ SOLN
1.0000 mg | Freq: Once | INTRAMUSCULAR | Status: AC
  Administered 2015-06-28: 1 mg via INTRAVENOUS
  Filled 2015-06-28: qty 1

## 2015-06-28 MED ORDER — ONDANSETRON 4 MG PO TBDP
4.0000 mg | ORAL_TABLET | Freq: Three times a day (TID) | ORAL | Status: DC | PRN
Start: 2015-06-28 — End: 2016-05-25

## 2015-06-28 MED ORDER — PANTOPRAZOLE SODIUM 40 MG PO TBEC: 40.0000 mg | DELAYED_RELEASE_TABLET | Freq: Every day | ORAL | Status: DC

## 2015-06-28 NOTE — ED Notes (Signed)
Patient has chest pain that started around 2am central chest, radiating to between shoulder blades, shortness of breath, diaphoresis.

## 2015-06-28 NOTE — ED Provider Notes (Signed)
TIME SEEN: 6:05 AM  CHIEF COMPLAINT: Chest pain  HPI: Patient is a 35 year old female with history of anxiety who presents to the emergency department with complaints of sharp and dull, constant, central chest pain that radiates into her back that started at 2 AM while asleep. Denies any aggravating or relieving factors. No shortness of breath. Has had nausea, dizziness and diaphoresis. Nondiaphoretic currently. No vomiting. No history of hypertension, diabetes, hyperlipidemia, tobacco use or family history of premature CAD.  No history of PE, DVT, exogenous estrogen use, fracture, surgery, trauma, hospitalization, prolonged travel. No lower extremity swelling or pain. No calf tenderness.  States she has never had similar symptoms before. States she ate [place] food for dinner. At first thought that this could be "gas pain".  ROS: See HPI Constitutional: no fever  Eyes: no drainage  ENT: no runny nose   Cardiovascular:   chest pain  Resp: no SOB  GI: no vomiting GU: no dysuria Integumentary: no rash  Allergy: no hives  Musculoskeletal: no leg swelling  Neurological: no slurred speech ROS otherwise negative  PAST MEDICAL HISTORY/PAST SURGICAL HISTORY:  Past Medical History  Diagnosis Date  . Abnormal pap   . Anxiety   . Depression     MEDICATIONS:  Prior to Admission medications   Medication Sig Start Date End Date Taking? Authorizing Provider  cefPROZIL (CEFZIL) 500 MG tablet Take 1 tablet by mouth twice a day for 7 days 06/12/15   Babs SciaraScott A Luking, MD  citalopram (CELEXA) 10 MG tablet Take 1 tablet (10 mg total) by mouth daily. 10/16/14 10/16/15  Babs SciaraScott A Luking, MD  clonazePAM (KLONOPIN) 0.5 MG tablet 1/2 to 1 bid prn 10/16/14   Babs SciaraScott A Luking, MD  fluconazole (DIFLUCAN) 150 MG tablet Take 1 tablet by mouth one week apart. 06/12/15   Babs SciaraScott A Luking, MD  pantoprazole (PROTONIX) 40 MG tablet TAKE 1 TABLET BY MOUTH ONCE DAILY 04/23/15   Campbell Richesarolyn C Hoskins, NP    ALLERGIES:  Allergies   Allergen Reactions  . Augmentin [Amoxicillin-Pot Clavulanate] Other (See Comments)    Abdominal Pain    SOCIAL HISTORY:  Social History  Substance Use Topics  . Smoking status: Never Smoker   . Smokeless tobacco: Never Used  . Alcohol Use: 0.6 oz/week    1 Glasses of wine per week    FAMILY HISTORY: Family History  Problem Relation Age of Onset  . Hypertension Mother   . Cancer Maternal Grandmother     colon cancer  . Cancer Maternal Grandfather     colon cancer    EXAM: BP 162/95 mmHg  Pulse 77  Temp(Src) 98.1 F (36.7 C) (Oral)  Resp 12  Ht 4\' 11"  (1.499 m)  Wt 164 lb (74.39 kg)  BMI 33.11 kg/m2  SpO2 100% CONSTITUTIONAL: Alert and oriented and responds appropriately to questions. Well-appearing; well-nourished, Tearful, appears anxious HEAD: Normocephalic EYES: Conjunctivae clear, PERRL ENT: normal nose; no rhinorrhea; moist mucous membranes NECK: Supple, no meningismus, no LAD  CARD: RRR; S1 and S2 appreciated; no murmurs, no clicks, no rubs, no gallops CHEST:  Chest wall is nontender to palpation without crepitus, ecchymosis or deformity RESP: Normal chest excursion without splinting or tachypnea; breath sounds clear and equal bilaterally; no wheezes, no rhonchi, no rales, no hypoxia or respiratory distress, speaking full sentences ABD/GI: Normal bowel sounds; non-distended; soft, minimally tender to palpation in the epigastric region and left upper quadrant, no rebound, no guarding, no peritoneal signs BACK:  The back appears normal and  is non-tender to palpation, there is no CVA tenderness EXT: Normal ROM in all joints; non-tender to palpation; no edema; normal capillary refill; no cyanosis, no calf tenderness or swelling    SKIN: Normal color for age and race; warm; no rash NEURO: Moves all extremities equally, sensation to light touch intact diffusely, cranial nerves II through XII intact PSYCH: The patient's mood and manner are appropriate. Grooming and  personal hygiene are appropriate.  MEDICAL DECISION MAKING: Patient here with chest pain. She has no risk factors for ACS, PE. Low suspicion for dissection. Suspect this could be GERD, gastritis given she has some epigastric and left upper quadrant tenderness. Will obtain abdominal labs. Will obtain a troponin and a d-dimer given she does appear uncomfortable and is tearful. She appears very anxious. We'll give IV Ativan as well as GI cocktail, Zofran, Protonix.  ED PROGRESS: 7:00 AM  Pt reports feeling much better. She still hemodynamically stable. No longer tearful. Labs have been unremarkable. No leukocytosis. No significant anemia. Normal electrolytes. Normal LFTs, lipase. Negative troponin and negative d-dimer. Chest x-ray is also clear. Suspect that this was a component of indigestion, GERD as well as anxiety. We'll discharge with perception for Protonix, Zofran. I feel she is safe to be discharged home. Her PCP is Dr. Gerda Diss. Discussed return precautions.   At this time, I do not feel there is any life-threatening condition present. I have reviewed and discussed all results (EKG, imaging, lab, urine as appropriate), exam findings with patient. I have reviewed nursing notes and appropriate previous records.  I feel the patient is safe to be discharged home without further emergent workup. Discussed usual and customary return precautions. Patient and family (if present) verbalize understanding and are comfortable with this plan.  Patient will follow-up with their primary care provider. If they do not have a primary care provider, information for follow-up has been provided to them. All questions have been answered.     EKG Interpretation  Date/Time:  Friday Jun 28 2015 06:02:15 EDT Ventricular Rate:  85 PR Interval:  115 QRS Duration: 116 QT Interval:  381 QTC Calculation: 453 R Axis:   39 Text Interpretation:  Sinus rhythm Borderline short PR interval Incomplete right bundle branch block No  old tracing to compare Confirmed by WARD,  DO, KRISTEN [phone]) on 06/28/2015 6:03:57 AM        Layla Maw Ward, DO 06/28/15 1914

## 2015-06-28 NOTE — Discharge Instructions (Signed)
Nonspecific Chest Pain  Chest pain can be caused by many different conditions. There is always a chance that your pain could be related to something serious, such as a heart attack or a blood clot in your lungs. Chest pain can also be caused by conditions that are not life-threatening. If you have chest pain, it is very important to follow up with your health care provider. CAUSES  Chest pain can be caused by:  Heartburn.  Pneumonia or bronchitis.  Anxiety or stress.  Inflammation around your heart (pericarditis) or lung (pleuritis or pleurisy).  A blood clot in your lung.  A collapsed lung (pneumothorax). It can develop suddenly on its own (spontaneous pneumothorax) or from trauma to the chest.  Shingles infection (varicella-zoster virus).  Heart attack.  Damage to the bones, muscles, and cartilage that make up your chest wall. This can include:  Bruised bones due to injury.  Strained muscles or cartilage due to frequent or repeated coughing or overwork.  Fracture to one or more ribs.  Sore cartilage due to inflammation (costochondritis). RISK FACTORS  Risk factors for chest pain may include:  Activities that increase your risk for trauma or injury to your chest.  Respiratory infections or conditions that cause frequent coughing.  Medical conditions or overeating that can cause heartburn.  Heart disease or family history of heart disease.  Conditions or health behaviors that increase your risk of developing a blood clot.  Having had chicken pox (varicella zoster). SIGNS AND SYMPTOMS Chest pain can feel like:  Burning or tingling on the surface of your chest or deep in your chest.  Crushing, pressure, aching, or squeezing pain.  Dull or sharp pain that is worse when you move, cough, or take a deep breath.  Pain that is also felt in your back, neck, shoulder, or arm, or pain that spreads to any of these areas. Your chest pain may come and go, or it may stay  constant. DIAGNOSIS Lab tests or other studies may be needed to find the cause of your pain. Your health care provider may have you take a test called an ambulatory ECG (electrocardiogram). An ECG records your heartbeat patterns at the time the test is performed. You may also have other tests, such as:  Transthoracic echocardiogram (TTE). During echocardiography, sound waves are used to create a picture of all of the heart structures and to look at how blood flows through your heart.  Transesophageal echocardiogram (TEE).This is a more advanced imaging test that obtains images from inside your body. It allows your health care provider to see your heart in finer detail.  Cardiac monitoring. This allows your health care provider to monitor your heart rate and rhythm in real time.  Holter monitor. This is a portable device that records your heartbeat and can help to diagnose abnormal heartbeats. It allows your health care provider to track your heart activity for several days, if needed.  Stress tests. These can be done through exercise or by taking medicine that makes your heart beat more quickly.  Blood tests.  Imaging tests. TREATMENT  Your treatment depends on what is causing your chest pain. Treatment may include:  Medicines. These may include:  Acid blockers for heartburn.  Anti-inflammatory medicine.  Pain medicine for inflammatory conditions.  Antibiotic medicine, if an infection is present.  Medicines to dissolve blood clots.  Medicines to treat coronary artery disease.  Supportive care for conditions that do not require medicines. This may include:  Resting.  Applying heat  or cold packs to injured areas.  Limiting activities until pain decreases. HOME CARE INSTRUCTIONS  If you were prescribed an antibiotic medicine, finish it all even if you start to feel better.  Avoid any activities that bring on chest pain.  Do not use any tobacco products, including  cigarettes, chewing tobacco, or electronic cigarettes. If you need help quitting, ask your health care provider.  Do not drink alcohol.  Take medicines only as directed by your health care provider.  Keep all follow-up visits as directed by your health care provider. This is important. This includes any further testing if your chest pain does not go away.  If heartburn is the cause for your chest pain, you may be told to keep your head raised (elevated) while sleeping. This reduces the chance that acid will go from your stomach into your esophagus.  Make lifestyle changes as directed by your health care provider. These may include:  Getting regular exercise. Ask your health care provider to suggest some activities that are safe for you.  Eating a heart-healthy diet. A registered dietitian can help you to learn healthy eating options.  Maintaining a healthy weight.  Managing diabetes, if necessary.  Reducing stress. SEEK MEDICAL CARE IF:  Your chest pain does not go away after treatment.  You have a rash with blisters on your chest.  You have a fever. SEEK IMMEDIATE MEDICAL CARE IF:   Your chest pain is worse.  You have an increasing cough, or you cough up blood.  You have severe abdominal pain.  You have severe weakness.  You faint.  You have chills.  You have sudden, unexplained chest discomfort.  You have sudden, unexplained discomfort in your arms, back, neck, or jaw.  You have shortness of breath at any time.  You suddenly start to sweat, or your skin gets clammy.  You feel nauseous or you vomit.  You suddenly feel light-headed or dizzy.  Your heart begins to beat quickly, or it feels like it is skipping beats. These symptoms may represent a serious problem that is an emergency. Do not wait to see if the symptoms will go away. Get medical help right away. Call your local emergency services (911 in the [place] Do not drive yourself to the hospital.   This  information is not intended to replace advice given to you by your health care provider. Make sure you discuss any questions you have with your health care provider.   Document Released: 11/19/2004 Document Revised: 03/02/2014 Document Reviewed: 09/15/2013 Elsevier Interactive Patient Education 2016 [employer].   Food Choices for Gastroesophageal Reflux Disease, Adult When you have gastroesophageal reflux disease (GERD), the foods you eat and your eating habits are very important. Choosing the right foods can help ease the discomfort of GERD. WHAT GENERAL GUIDELINES DO I NEED TO FOLLOW?  Choose fruits, vegetables, whole grains, low-fat dairy products, and low-fat meat, fish, and poultry.  Limit fats such as oils, salad dressings, butter, nuts, and avocado.  Keep a food diary to identify foods that cause symptoms.  Avoid foods that cause reflux. These may be different for different people.  Eat frequent small meals instead of three large meals each day.  Eat your meals slowly, in a relaxed setting.  Limit fried foods.  Cook foods using methods other than frying.  Avoid drinking alcohol.  Avoid drinking large amounts of liquids with your meals.  Avoid bending over or lying down until 2-3 hours after eating. WHAT FOODS ARE NOT  RECOMMENDED? The following are some foods and drinks that may worsen your symptoms: Vegetables Tomatoes. Tomato juice. Tomato and spaghetti sauce. Chili peppers. Onion and garlic. Horseradish. Fruits Oranges, grapefruit, and lemon (fruit and juice). Meats High-fat meats, fish, and poultry. This includes hot dogs, ribs, ham, sausage, salami, and bacon. Dairy Whole milk and chocolate milk. Sour cream. Cream. Butter. Ice cream. Cream cheese.  Beverages Coffee and tea, with or without caffeine. Carbonated beverages or energy drinks. Condiments Hot sauce. Barbecue sauce.  Sweets/Desserts Chocolate and cocoa. Donuts. Peppermint and spearmint. Fats and  Oils High-fat foods, including [place] fries and potato chips. Other Vinegar. Strong spices, such as black pepper, white pepper, red pepper, cayenne, curry powder, cloves, ginger, and chili powder. The items listed above may not be a complete list of foods and beverages to avoid. Contact your dietitian for more information.   This information is not intended to replace advice given to you by your health care provider. Make sure you discuss any questions you have with your health care provider.   Document Released: 02/09/2005 Document Revised: 03/02/2014 Document Reviewed: 12/14/2012 Elsevier Interactive Patient Education 2016 [employer]   Possible Gastroesophageal Reflux Disease, Adult Normally, food travels down the esophagus and stays in the stomach to be digested. However, when a person has gastroesophageal reflux disease (GERD), food and stomach acid move back up into the esophagus. When this happens, the esophagus becomes sore and inflamed. Over time, GERD can create small holes (ulcers) in the lining of the esophagus.  CAUSES This condition is caused by a problem with the muscle between the esophagus and the stomach (lower esophageal sphincter, or LES). Normally, the LES muscle closes after food passes through the esophagus to the stomach. When the LES is weakened or abnormal, it does not close properly, and that allows food and stomach acid to go back up into the esophagus. The LES can be weakened by certain dietary substances, medicines, and medical conditions, including:  Tobacco use.  Pregnancy.  Having a hiatal hernia.  Heavy alcohol use.  Certain foods and beverages, such as coffee, chocolate, onions, and peppermint. RISK FACTORS This condition is more likely to develop in:  People who have an increased body weight.  People who have connective tissue disorders.  People who use NSAID medicines. SYMPTOMS Symptoms of this condition include:  Heartburn.  Difficult or  painful swallowing.  The feeling of having a lump in the throat.  Abitter taste in the mouth.  Bad breath.  Having a large amount of saliva.  Having an upset or bloated stomach.  Belching.  Chest pain.  Shortness of breath or wheezing.  Ongoing (chronic) cough or a night-time cough.  Wearing away of tooth enamel.  Weight loss. Different conditions can cause chest pain. Make sure to see your health care provider if you experience chest pain. DIAGNOSIS Your health care provider will take a medical history and perform a physical exam. To determine if you have mild or severe GERD, your health care provider may also monitor how you respond to treatment. You may also have other tests, including:  An endoscopy toexamine your stomach and esophagus with a small camera.  A test thatmeasures the acidity level in your esophagus.  A test thatmeasures how much pressure is on your esophagus.  A barium swallow or modified barium swallow to show the shape, size, and functioning of your esophagus. TREATMENT The goal of treatment is to help relieve your symptoms and to prevent complications. Treatment for this condition  may vary depending on how severe your symptoms are. Your health care provider may recommend:  Changes to your diet.  Medicine.  Surgery. HOME CARE INSTRUCTIONS Diet  Follow a diet as recommended by your health care provider. This may involve avoiding foods and drinks such as:  Coffee and tea (with or without caffeine).  Drinks that containalcohol.  Energy drinks and sports drinks.  Carbonated drinks or sodas.  Chocolate and cocoa.  Peppermint and mint flavorings.  Garlic and onions.  Horseradish.  Spicy and acidic foods, including peppers, chili powder, curry powder, vinegar, hot sauces, and barbecue sauce.  Citrus fruit juices and citrus fruits, such as oranges, lemons, and limes.  Tomato-based foods, such as red sauce, chili, salsa, and pizza  with red sauce.  Fried and fatty foods, such as donuts, french fries, potato chips, and high-fat dressings.  High-fat meats, such as hot dogs and fatty cuts of red and white meats, such as rib eye steak, sausage, ham, and bacon.  High-fat dairy items, such as whole milk, butter, and cream cheese.  Eat small, frequent meals instead of large meals.  Avoid drinking large amounts of liquid with your meals.  Avoid eating meals during the 2-3 hours before bedtime.  Avoid lying down right after you eat.  Do not exercise right after you eat. General Instructions  Pay attention to any changes in your symptoms.  Take over-the-counter and prescription medicines only as told by your health care provider. Do not take aspirin, ibuprofen, or other NSAIDs unless your health care provider told you to do so.  Do not use any tobacco products, including cigarettes, chewing tobacco, and e-cigarettes. If you need help quitting, ask your health care provider.  Wear loose-fitting clothing. Do not wear anything tight around your waist that causes pressure on your abdomen.  Raise (elevate) the head of your bed 6 inches (15cm).  Try to reduce your stress, such as with yoga or meditation. If you need help reducing stress, ask your health care provider.  If you are overweight, reduce your weight to an amount that is healthy for you. Ask your health care provider for guidance about a safe weight loss goal.  Keep all follow-up visits as told by your health care provider. This is important. SEEK MEDICAL CARE IF:  You have new symptoms.  You have unexplained weight loss.  You have difficulty swallowing, or it hurts to swallow.  You have wheezing or a persistent cough.  Your symptoms do not improve with treatment.  You have a hoarse voice. SEEK IMMEDIATE MEDICAL CARE IF:  You have pain in your arms, neck, jaw, teeth, or back.  You feel sweaty, dizzy, or light-headed.  You have chest pain or  shortness of breath.  You vomit and your vomit looks like blood or coffee grounds.  You faint.  Your stool is bloody or black.  You cannot swallow, drink, or eat.   This information is not intended to replace advice given to you by your health care provider. Make sure you discuss any questions you have with your health care provider.   Document Released: 11/19/2004 Document Revised: 10/31/2014 Document Reviewed: 06/06/2014 Elsevier Interactive Patient Education [employer]

## 2015-06-28 NOTE — ED Notes (Signed)
Pt made aware to return if symptoms worsen or if any life threatening symptoms occur.   

## 2015-07-01 ENCOUNTER — Encounter: Payer: Self-pay | Admitting: Family Medicine

## 2015-07-01 ENCOUNTER — Ambulatory Visit (INDEPENDENT_AMBULATORY_CARE_PROVIDER_SITE_OTHER): Payer: 59 | Admitting: Family Medicine

## 2015-07-01 VITALS — BP 126/86 | Temp 99.4°F | Ht 60.0 in | Wt 163.0 lb

## 2015-07-01 DIAGNOSIS — N3000 Acute cystitis without hematuria: Secondary | ICD-10-CM | POA: Diagnosis not present

## 2015-07-01 DIAGNOSIS — R1084 Generalized abdominal pain: Secondary | ICD-10-CM | POA: Diagnosis not present

## 2015-07-01 MED ORDER — CEFTRIAXONE SODIUM 1 G IJ SOLR
500.0000 mg | Freq: Once | INTRAMUSCULAR | Status: AC
  Administered 2015-07-01: 500 mg via INTRAMUSCULAR

## 2015-07-01 MED ORDER — PHENAZOPYRIDINE HCL 200 MG PO TABS
200.0000 mg | ORAL_TABLET | Freq: Three times a day (TID) | ORAL | Status: DC | PRN
Start: 2015-07-01 — End: 2015-07-31

## 2015-07-01 MED ORDER — CEFDINIR 300 MG PO CAPS: 300.0000 mg | ORAL_CAPSULE | Freq: Two times a day (BID) | ORAL | Status: DC

## 2015-07-01 NOTE — Progress Notes (Signed)
   Subjective:    Patient ID: [name], female    DOB: 04/09/1980, 35 y.o.   MRN: [id number]  Urinary Tract Infection  This is a recurrent problem. The current episode started yesterday. The maximum temperature recorded prior to her arrival was 100 - 100.9 F. Associated symptoms include flank pain, frequency and hematuria. Associated symptoms comments: Chills, Diarrhea, painful urination, abdominal pain. She has tried antibiotics (Azo, Cefzil, ) for the symptoms.   Patient states no other concerns this visit. PMH benign  Review of Systems  Constitutional: Positive for fever and fatigue.  HENT: Negative for congestion.   Gastrointestinal: Positive for abdominal pain and diarrhea. Negative for constipation.  Genitourinary: Positive for dysuria, frequency, hematuria and flank pain.       Objective:   Physical Exam  Flanks nontender abdomen soft lungs clear heart regular extremities no edema      Assessment & Plan:  Urinary tract infection was diagnosed with the patient. Antibiotics prescribed. Warning signs regarding advancement of illness/reoccurrence of illness/complications were discussed. Patient is to let us know if not back to normal health within the next several days. Also to let us know if high fevers severe flank pains or worse. I do not feel this is pyelonephritis currently but if she starts having severe pain or fevers she needs to go to ER follow-up here shot of Rocephin was given because of some fever yesterday we will check a CBC also urine culture and antibiotics prescribed

## 2015-07-02 LAB — CBC WITH DIFFERENTIAL/PLATELET
BASOS: 0 %
Basophils Absolute: 0 10*3/uL (ref 0.0–0.2)
EOS (ABSOLUTE): 0.1 10*3/uL (ref 0.0–0.4)
EOS: 1 %
HEMATOCRIT: 38.4 % (ref 34.0–46.6)
Hemoglobin: 12.2 g/dL (ref 11.1–15.9)
IMMATURE GRANS (ABS): 0 10*3/uL (ref 0.0–0.1)
IMMATURE GRANULOCYTES: 0 %
LYMPHS: 18 %
Lymphocytes Absolute: 1.3 10*3/uL (ref 0.7–3.1)
MCH: 27.1 pg (ref 26.6–33.0)
MCHC: 31.8 g/dL (ref 31.5–35.7)
MCV: 85 fL (ref 79–97)
MONOS ABS: 0.4 10*3/uL (ref 0.1–0.9)
Monocytes: 6 %
NEUTROS ABS: 5.2 10*3/uL (ref 1.4–7.0)
NEUTROS PCT: 75 %
Platelets: 365 10*3/uL (ref 150–379)
RBC: 4.5 x10E6/uL (ref 3.77–5.28)
RDW: 15.5 % — ABNORMAL HIGH (ref 12.3–15.4)
WBC: 7 10*3/uL (ref 3.4–10.8)

## 2015-07-03 LAB — PLEASE NOTE

## 2015-07-03 LAB — URINE CULTURE

## 2015-07-09 MED FILL — CITALOPRAM HBR 10 MG TABLET: 10 | 30 days supply | Qty: 30 | Fill #1

## 2015-07-11 MED FILL — ONDANSETRON ODT 4 MG TABLET: 4 | 7 days supply | Qty: 20 | Fill #0

## 2015-07-15 MED FILL — PANTOPRAZOLE SOD DR 40 MG T: 40 | 30 days supply | Qty: 30 | Fill #0

## 2015-07-31 ENCOUNTER — Encounter: Payer: Self-pay | Admitting: Family Medicine

## 2015-07-31 ENCOUNTER — Ambulatory Visit (INDEPENDENT_AMBULATORY_CARE_PROVIDER_SITE_OTHER): Payer: 59 | Admitting: Family Medicine

## 2015-07-31 VITALS — BP 132/80 | Temp 98.9°F | Ht 59.0 in | Wt 163.0 lb

## 2015-07-31 DIAGNOSIS — F418 Other specified anxiety disorders: Secondary | ICD-10-CM | POA: Diagnosis not present

## 2015-07-31 DIAGNOSIS — N390 Urinary tract infection, site not specified: Secondary | ICD-10-CM | POA: Diagnosis not present

## 2015-07-31 DIAGNOSIS — R300 Dysuria: Secondary | ICD-10-CM

## 2015-07-31 MED ORDER — CLONAZEPAM 0.5 MG PO TABS: ORAL_TABLET | ORAL | Status: DC

## 2015-07-31 MED ORDER — PHENAZOPYRIDINE HCL 200 MG PO TABS: 200.0000 mg | ORAL_TABLET | Freq: Three times a day (TID) | ORAL | Status: DC | PRN

## 2015-07-31 MED ORDER — CITALOPRAM HYDROBROMIDE 20 MG PO TABS: 20.0000 mg | ORAL_TABLET | Freq: Every day | ORAL | Status: DC

## 2015-07-31 MED ORDER — CIPROFLOXACIN HCL 500 MG PO TABS: 500.0000 mg | ORAL_TABLET | Freq: Two times a day (BID) | ORAL | Status: DC

## 2015-07-31 MED FILL — CITALOPRAM HBR 20 MG TABLET: 20 | 30 days supply | Qty: 30 | Fill #0

## 2015-07-31 MED FILL — PHENAZOPYRIDINE 200 MG TAB: 200 | 5 days supply | Qty: 15 | Fill #0

## 2015-07-31 MED FILL — CIPROFLOXACIN HCL 500 MG TA: 500 | 10 days supply | Qty: 20 | Fill #0

## 2015-07-31 MED FILL — clonazePAM 0.5 MG TABS: 0.5 | 24 days supply | Qty: 24 | Fill #0

## 2015-07-31 NOTE — Progress Notes (Signed)
   Subjective:    Patient ID: Charlene DollyChristina M Brown, female    DOB: 10/17/1980, 35 y.o.   MRN: [id number] Patient arrives office with multiple concerns Dysuria  This is a new problem. The current episode started today. Associated symptoms comments: Abdominal pain. Treatments tried: pyridium.   Patient had urinary tract and infection us a few weeks ago. Took all meds. Now having symptoms again.  Patient also notes increased stress. Feels she needs to use Klonopin more often. Uses sparingly still not every single day. Also feel she needs me and stronger dose of Celexa. No suicidal thoughts just feels she needs to get back up to 20 mg. In past has been on as much as 40  No fever or chills   Review of Systems  Genitourinary: Positive for dysuria.  No vomiting no diarrhea no rash     Objective:   Physical Exam  Alert vitals stable no obvious but very slight left CVA tenderness lungs clear heart rare rhythm.  Urinalysis numerous white blood cells per high-power field    Assessment & Plan:  Impression urinary tract infection #2 depression anxiety suboptimum discuss at length. #3 recurrent UTIs concern patient discuss also plan antibiotics and Pyridium. Return after antibiotics finished. Will repeat culture then. Increase Klonopin proper use discussed. Increase Celexa rationale discussed. Follow-up as scheduled 25 minutes spent most discussion of these 3 issues

## 2015-08-01 ENCOUNTER — Encounter (HOSPITAL_COMMUNITY): Payer: Self-pay

## 2015-08-01 ENCOUNTER — Emergency Department (HOSPITAL_COMMUNITY): Payer: 59

## 2015-08-01 ENCOUNTER — Emergency Department (HOSPITAL_COMMUNITY)
Admission: EM | Admit: 2015-08-01 | Discharge: 2015-08-01 | Disposition: A | Discharge status: Home / Self Care | Payer: 59 | Attending: Emergency Medicine | Admitting: Emergency Medicine

## 2015-08-01 DIAGNOSIS — F329 Major depressive disorder, single episode, unspecified: Secondary | ICD-10-CM | POA: Diagnosis not present

## 2015-08-01 DIAGNOSIS — N390 Urinary tract infection, site not specified: Secondary | ICD-10-CM | POA: Insufficient documentation

## 2015-08-01 DIAGNOSIS — Z79899 Other long term (current) drug therapy: Secondary | ICD-10-CM | POA: Insufficient documentation

## 2015-08-01 DIAGNOSIS — R319 Hematuria, unspecified: Secondary | ICD-10-CM | POA: Diagnosis not present

## 2015-08-01 DIAGNOSIS — R1030 Lower abdominal pain, unspecified: Secondary | ICD-10-CM | POA: Diagnosis present

## 2015-08-01 DIAGNOSIS — K8020 Calculus of gallbladder without cholecystitis without obstruction: Secondary | ICD-10-CM | POA: Diagnosis not present

## 2015-08-01 LAB — CBC WITH DIFFERENTIAL/PLATELET
BASOS ABS: 0 10*3/uL (ref 0.0–0.1)
Basophils Relative: 0 %
EOS PCT: 1 %
Eosinophils Absolute: 0.1 10*3/uL (ref 0.0–0.7)
HEMATOCRIT: 35.3 % — AB (ref 36.0–46.0)
Hemoglobin: 11.2 g/dL — ABNORMAL LOW (ref 12.0–15.0)
LYMPHS ABS: 1.9 10*3/uL (ref 0.7–4.0)
LYMPHS PCT: 18 %
MCH: 27.1 pg (ref 26.0–34.0)
MCHC: 31.7 g/dL (ref 30.0–36.0)
MCV: 85.5 fL (ref 78.0–100.0)
MONO ABS: 0.5 10*3/uL (ref 0.1–1.0)
MONOS PCT: 4 %
NEUTROS ABS: 8.1 10*3/uL — AB (ref 1.7–7.7)
Neutrophils Relative %: 77 %
Platelets: 305 10*3/uL (ref 150–400)
RBC: 4.13 MIL/uL (ref 3.87–5.11)
RDW: 14.8 % (ref 11.5–15.5)
WBC: 10.5 10*3/uL (ref 4.0–10.5)

## 2015-08-01 LAB — BASIC METABOLIC PANEL
ANION GAP: 8 (ref 5–15)
BUN: 10 mg/dL (ref 6–20)
CALCIUM: 8.3 mg/dL — AB (ref 8.9–10.3)
CO2: 25 mmol/L (ref 22–32)
Chloride: 103 mmol/L (ref 101–111)
Creatinine, Ser: 0.82 mg/dL (ref 0.44–1.00)
GFR calc Af Amer: >60 mL/min (ref >60)
GFR calc non Af Amer: >60 mL/min (ref >60)
GLUCOSE: 110 mg/dL — AB (ref 65–99)
Potassium: 3.5 mmol/L (ref 3.5–5.1)
Sodium: 136 mmol/L (ref 135–145)

## 2015-08-01 LAB — URINALYSIS, ROUTINE W REFLEX MICROSCOPIC
BILIRUBIN URINE: NEGATIVE
Glucose, UA: 250 mg/dL — AB
Ketones, ur: NEGATIVE mg/dL
NITRITE: POSITIVE — AB
SPECIFIC GRAVITY, URINE: 1.025 (ref 1.005–1.030)
pH: 6.5 (ref 5.0–8.0)

## 2015-08-01 LAB — URINE MICROSCOPIC-ADD ON

## 2015-08-01 LAB — PREGNANCY, URINE: PREG TEST UR: NEGATIVE

## 2015-08-01 LAB — LIPASE, BLOOD: Lipase: 27 U/L (ref 11–51)

## 2015-08-01 MED ORDER — ONDANSETRON HCL 4 MG/2ML IJ SOLN
4.0000 mg | Freq: Once | INTRAMUSCULAR | Status: AC
  Administered 2015-08-01: 4 mg via INTRAVENOUS
  Filled 2015-08-01: qty 2

## 2015-08-01 MED ORDER — PROMETHAZINE HCL 25 MG PO TABS: 25.0000 mg | ORAL_TABLET | Freq: Four times a day (QID) | ORAL | Status: DC | PRN

## 2015-08-01 MED ORDER — CIPROFLOXACIN IN D5W 400 MG/200ML IV SOLN
400.0000 mg | Freq: Once | INTRAVENOUS | Status: AC
  Administered 2015-08-01: 400 mg via INTRAVENOUS
  Filled 2015-08-01: qty 200

## 2015-08-01 MED ORDER — KETOROLAC TROMETHAMINE 30 MG/ML IJ SOLN
30.0000 mg | Freq: Once | INTRAMUSCULAR | Status: AC
  Administered 2015-08-01: 30 mg via INTRAVENOUS
  Filled 2015-08-01: qty 1

## 2015-08-01 MED ORDER — IOPAMIDOL (ISOVUE-300) INJECTION 61%
100.0000 mL | Freq: Once | INTRAVENOUS | Status: AC | PRN
  Administered 2015-08-01: 100 mL via INTRAVENOUS

## 2015-08-01 MED ORDER — IBUPROFEN 800 MG PO TABS: 800.0000 mg | ORAL_TABLET | Freq: Three times a day (TID) | ORAL | Status: DC

## 2015-08-01 NOTE — ED Notes (Signed)
Patient verbalizes understanding of discharge instructions, prescriptions, home care and follow up care. Patient out of department at this time. 

## 2015-08-01 NOTE — Discharge Instructions (Signed)
Cipro twice daily for 10 days Phenergan for nausea (sedating) as needed every 8 hours Motrin 3 times daily for pain  Please obtain all of your results from medical records or have your doctors office obtain the results - share them with your doctor - you should be seen at your doctors office in the next 2 days. Call today to arrange your follow up. Take the medications as prescribed. Please review all of the medicines and only take them if you do not have an allergy to them. Please be aware that if you are taking birth control pills, taking other prescriptions, ESPECIALLY ANTIBIOTICS may make the birth control ineffective - if this is the case, either do not engage in sexual activity or use alternative methods of birth control such as condoms until you have finished the medicine and your family doctor says it is OK to restart them. If you are on a blood thinner such as COUMADIN, be aware that any other medicine that you take may cause the coumadin to either work too much, or not enough - you should have your coumadin level rechecked in next 7 days if this is the case.  ?  It is also a possibility that you have an allergic reaction to any of the medicines that you have been prescribed - Everybody reacts differently to medications and while MOST people have no trouble with most medicines, you may have a reaction such as nausea, vomiting, rash, swelling, shortness of breath. If this is the case, please stop taking the medicine immediately and contact your physician.  ?  You should return to the ER if you develop severe or worsening symptoms.

## 2015-08-01 NOTE — ED Notes (Signed)
Pt reports was diagnosed with a UTI 2 months ago and has been on 4 different antibiotics.  Pt says is still having a lot of abd pain and is having pain in back.

## 2015-08-01 NOTE — ED Provider Notes (Signed)
CSN: [id number]     Arrival date & time 08/01/15  1359 History   First MD Initiated Contact with Patient 08/01/15 1409     Chief Complaint  Patient presents with  . Abdominal Pain     (Consider location/radiation/quality/duration/timing/severity/associated sxs/prior Treatment) HPI Comments: 2 days ago with freq / urg / dysuria with some bloo - she has had lower abd pain and back pain - has had multikple courses of abx.  +C and N, no fever or vomiting.  Sx are constant for 2 days - taking tylenol / AZO without relief.    Prior tx with Augmentin, Omnicef (cipro hasn't been filled yet).  Patient is a 35 y.o. female presenting with abdominal pain. The history is provided by the patient.  Abdominal Pain   Past Medical History  Diagnosis Date  . Abnormal pap   . Anxiety   . Depression    Past Surgical History  Procedure Laterality Date  . Cesarean section    . Right tube removed      history of ectopic pregnancies  . Left fallopian tube removal     Family History  Problem Relation Age of Onset  . Hypertension Mother   . Cancer Maternal Grandmother     colon cancer  . Cancer Maternal Grandfather     colon cancer   Social History  Substance Use Topics  . Smoking status: Never Smoker   . Smokeless tobacco: Never Used  . Alcohol Use: 0.6 oz/week    1 Glasses of wine per week     Comment: socially   OB History    Gravida Para Term Preterm AB TAB SAB Ectopic Multiple Living   Review of Systems  Gastrointestinal: Positive for abdominal pain.  All other systems reviewed and are negative.     Allergies  Augmentin  Home Medications   Prior to Admission medications   Medication Sig Start Date End Date Taking? Authorizing Provider  ciprofloxacin (CIPRO) 500 MG tablet Take 1 tablet (500 mg total) by mouth 2 (two) times daily. 07/31/15  Yes Merlyn Albert, MD  citalopram (CELEXA) 20 MG tablet Take 1 tablet (20 mg total) by mouth daily. 07/31/15 07/30/16  Yes Merlyn Albert, MD  clonazePAM (KLONOPIN) 0.5 MG tablet Take 1/2 to 1 qd prn Patient taking differently: Take 0.5 mg by mouth daily as needed for anxiety. Take 1/2 to 1 qd prn 07/31/15  Yes Merlyn Albert, MD  ondansetron (ZOFRAN ODT) 4 MG disintegrating tablet Take 1 tablet (4 mg total) by mouth every 8 (eight) hours as needed for nausea or vomiting. 06/28/15  Yes Kristen N Ward, DO  pantoprazole (PROTONIX) 40 MG tablet Take 1 tablet (40 mg total) by mouth daily. 06/28/15  Yes Kristen N Ward, DO  phenazopyridine (PYRIDIUM) 200 MG tablet Take 1 tablet (200 mg total) by mouth 3 (three) times daily as needed for pain. 07/31/15  Yes Merlyn Albert, MD  ibuprofen (ADVIL,MOTRIN) 800 MG tablet Take 1 tablet (800 mg total) by mouth 3 (three) times daily. 08/01/15   Eber Hong, MD  promethazine (PHENERGAN) 25 MG tablet Take 1 tablet (25 mg total) by mouth every 6 (six) hours as needed for nausea or vomiting. 08/01/15   Eber Hong, MD   BP 152/77 mmHg  Pulse 88  Temp(Src) 98.4 F (36.9 C) (Oral)  Resp 14  Ht  (1.549 m)  Wt 163 lb (73.936  kg)  BMI 30.81 kg/m2  SpO2 99%  LMP 07/18/2015 Physical Exam  Constitutional: She appears well-developed and well-nourished. No distress.  HENT:  Head: Normocephalic and atraumatic.  Mouth/Throat: Oropharynx is clear and moist. No oropharyngeal exudate.  Eyes: Conjunctivae and EOM are normal. Pupils are equal, round, and reactive to light. Right eye exhibits no discharge. Left eye exhibits no discharge. No scleral icterus.  Neck: Normal range of motion. Neck supple. No JVD present. No thyromegaly present.  Cardiovascular: Normal rate, regular rhythm, normal heart sounds and intact distal pulses.  Exam reveals no gallop and no friction rub.   No murmur heard. Pulmonary/Chest: Effort normal and breath sounds normal. No respiratory distress. She has no wheezes. She has no rales.  Abdominal: Soft. Bowel sounds are normal. She exhibits no distension and no  mass. There is tenderness ( SP and RLQ ttp - soft no peritoneal signs, no masses.  + CVA ttp on the L).  Musculoskeletal: Normal range of motion. She exhibits no edema or tenderness.  Lymphadenopathy:    She has no cervical adenopathy.  Neurological: She is alert. Coordination normal.  Skin: Skin is warm and dry. No rash noted. No erythema.  Psychiatric: She has a normal mood and affect. Her behavior is normal.  Nursing note and vitals reviewed.   ED Course  Procedures (including critical care time) Labs Review Labs Reviewed  URINALYSIS, ROUTINE W REFLEX MICROSCOPIC (NOT AT [facility]) - Abnormal; Notable for the following:    Color, Urine AMBER (*)    APPearance HAZY (*)    Glucose, UA 250 (*)    Hgb urine dipstick MODERATE (*)    Protein, ur >300 (*)    Nitrite POSITIVE (*)    Leukocytes, UA TRACE (*)    All other components within normal limits  CBC WITH DIFFERENTIAL/PLATELET - Abnormal; Notable for the following:    Hemoglobin 11.2 (*)    HCT 35.3 (*)    Neutro Abs 8.1 (*)    All other components within normal limits  BASIC METABOLIC PANEL - Abnormal; Notable for the following:    Glucose, Bld 110 (*)    Calcium 8.3 (*)    All other components within normal limits  URINE MICROSCOPIC-ADD ON - Abnormal; Notable for the following:    Squamous Epithelial / LPF 0-5 (*)    Bacteria, UA MANY (*)    All other components within normal limits  URINE CULTURE  PREGNANCY, URINE  LIPASE, BLOOD    Imaging Review Ct Abdomen Pelvis W Contrast  08/01/2015  CLINICAL DATA:  Left flank pain, left lower quadrant pain for 24 hours with hematuria, urinary tract infection EXAM: CT ABDOMEN AND PELVIS WITH CONTRAST TECHNIQUE: Multidetector CT imaging of the abdomen and pelvis was performed using the standard protocol following bolus administration of intravenous contrast. CONTRAST:  ISOVUE-300 IOPAMIDOL (ISOVUE-300) INJECTION 61% COMPARISON:  None. FINDINGS: Lower chest:  Normal Hepatobiliary:  Small noncalcified gallstone. Possible trace fluid gallbladder fossa. Pancreas: Normal Spleen: Normal Adrenals/Urinary Tract: Adrenal glands are normal. Right kidney is normal. Left kidney is normal. No perinephric inflammation. No hydronephrosis. Bladder is normal. Stomach/Bowel: There is a nonobstructive bowel gas pattern. Moderate fecal retention. Appendix not identified. No evidence of appendicitis appreciated. Small bowel and stomach normal. Vascular/Lymphatic: Negative Reproductive: Simple appearing 2 cm left adnexal cyst likely of ovarian origin. Other: None Musculoskeletal: No acute findings IMPRESSION: 1. Small noncalcified gallstone. Possible trace fluid in the gallbladder fossa. Correlate for any right upper quadrant symptoms and consider right  upper quadrant ultrasound if indicated. 2. No imaging evidence of pyelonephritis or pyonephrosis. 3. Incidental simple appearing cyst left ovary measuring about 2 cm. Electronically Signed   By: Esperanza Heiraymond  Rubner M.D.   On: 08/01/2015 15:32   I have personally reviewed and evaluated these images and lab results as part of my medical decision-making.    MDM   Final diagnoses:  UTI (lower urinary tract infection)    eval for ongoing UTI - culture - based on sensitivity from 1 month ago - would benefit from Cipro. Usually has yearly UTI - last 2 months - frequently.  CT to r/o abscess / Kidney stone.  UTI present - cipro IV, (pt already has Rx for Cipro for 10 days), culture sent Pt updated on results and recommendations including cyst on ovary.  (has no RUQ ttp) Recommended against Zofran due to possible prolonged QT. CT without acute findings  Meds given in ED:  Medications  ciprofloxacin (CIPRO) IVPB 400 mg (400 mg Intravenous New Bag/Given 08/01/15 1534)  ketorolac (TORADOL) 30 MG/ML injection 30 mg (not administered)  ondansetron (ZOFRAN) injection 4 mg (not administered)  iopamidol (ISOVUE-300) 61 % injection 100 mL (100 mLs Intravenous  Contrast Given 08/01/15 1515)    New Prescriptions   IBUPROFEN (ADVIL,MOTRIN) 800 MG TABLET    Take 1 tablet (800 mg total) by mouth 3 (three) times daily.   PROMETHAZINE (PHENERGAN) 25 MG TABLET    Take 1 tablet (25 mg total) by mouth every 6 (six) hours as needed for nausea or vomiting.      Eber HongBrian Marquia Costello, MD 08/01/15 [phone]

## 2015-08-03 LAB — URINE CULTURE: Culture: NO GROWTH

## 2015-08-14 ENCOUNTER — Ambulatory Visit: Payer: 59 | Admitting: Family Medicine

## 2015-08-15 ENCOUNTER — Telehealth: Payer: Self-pay | Admitting: Family Medicine

## 2015-08-15 MED ORDER — FLUCONAZOLE 150 MG PO TABS: ORAL_TABLET | ORAL | Status: DC

## 2015-08-15 NOTE — Telephone Encounter (Signed)
Pt is wanting to know if she can get Diflucan x2 sent to  [facility] Since she has been on antibiotics she has a yeast infection now

## 2015-08-15 NOTE — Telephone Encounter (Signed)
Spoke with patient and discussed symptoms. Patient stated she finished a course of antibiotics and has vaginal itching and redness with yellow "cottage cheese" like discharge. Advised patient per facility protocol that we are sending in Diflucan 150 mg take 1 tablet 3 days apart. Patient verbalized understanding. Medication sent into pharmacy.

## 2015-08-20 ENCOUNTER — Ambulatory Visit: Payer: 59 | Admitting: Family Medicine

## 2015-09-23 ENCOUNTER — Ambulatory Visit: Payer: 59 | Admitting: Nurse Practitioner

## 2015-09-23 ENCOUNTER — Encounter: Payer: Self-pay | Admitting: Family Medicine

## 2015-09-23 DIAGNOSIS — Z0289 Encounter for other administrative examinations: Secondary | ICD-10-CM

## 2015-10-03 ENCOUNTER — Other Ambulatory Visit: Payer: Self-pay | Admitting: *Deleted

## 2015-10-03 MED ORDER — CLONAZEPAM 0.5 MG PO TABS: ORAL_TABLET | ORAL | 1 refills | Status: DC

## 2015-10-03 MED FILL — clonazePAM 0.5 MG TABS: 0.5 | 24 days supply | Qty: 24 | Fill #0

## 2015-10-03 MED FILL — PANTOPRAZOLE SOD DR 40 MG T: 40 | 30 days supply | Qty: 30 | Fill #1

## 2015-10-03 NOTE — Telephone Encounter (Signed)
May have 2 refills needs follow-up visits for these medicines this fall

## 2015-11-20 ENCOUNTER — Ambulatory Visit: Payer: 59 | Admitting: Family Medicine

## 2015-11-20 MED FILL — clonazePAM 0.5 MG TABS: 0.5 | 24 days supply | Qty: 24 | Fill #1

## 2016-03-13 ENCOUNTER — Ambulatory Visit: Payer: 59 | Admitting: Nurse Practitioner

## 2016-03-16 ENCOUNTER — Other Ambulatory Visit: Payer: Self-pay | Admitting: *Deleted

## 2016-03-16 ENCOUNTER — Ambulatory Visit (INDEPENDENT_AMBULATORY_CARE_PROVIDER_SITE_OTHER): Payer: 59 | Admitting: Nurse Practitioner

## 2016-03-16 VITALS — BP 108/76 | Temp 98.4°F | Wt 149.0 lb

## 2016-03-16 DIAGNOSIS — R519 Headache, unspecified: Secondary | ICD-10-CM

## 2016-03-16 DIAGNOSIS — T3991XA Poisoning by unspecified nonopioid analgesic, antipyretic and antirheumatic, accidental (unintentional), initial encounter: Secondary | ICD-10-CM

## 2016-03-16 DIAGNOSIS — T3995XA Adverse effect of unspecified nonopioid analgesic, antipyretic and antirheumatic, initial encounter: Secondary | ICD-10-CM

## 2016-03-16 DIAGNOSIS — K219 Gastro-esophageal reflux disease without esophagitis: Secondary | ICD-10-CM

## 2016-03-16 DIAGNOSIS — R51 Headache: Secondary | ICD-10-CM

## 2016-03-16 DIAGNOSIS — G4440 Drug-induced headache, not elsewhere classified, not intractable: Secondary | ICD-10-CM

## 2016-03-16 DIAGNOSIS — F418 Other specified anxiety disorders: Secondary | ICD-10-CM

## 2016-03-16 MED ORDER — PANTOPRAZOLE SODIUM 40 MG PO TBEC: 40.0000 mg | DELAYED_RELEASE_TABLET | Freq: Every day | ORAL | 1 refills | Status: DC

## 2016-03-16 MED ORDER — CITALOPRAM HYDROBROMIDE 20 MG PO TABS: 20.0000 mg | ORAL_TABLET | Freq: Every day | ORAL | 1 refills | Status: DC

## 2016-03-16 MED FILL — PANTOPRAZOLE SOD DR 40 MG T: 40 | 90 days supply | Qty: 90 | Fill #0

## 2016-03-16 MED FILL — CITALOPRAM HBR 20 MG TABLET: 20 | 90 days supply | Qty: 90 | Fill #0

## 2016-03-17 ENCOUNTER — Encounter: Payer: Self-pay | Admitting: Nurse Practitioner

## 2016-03-17 DIAGNOSIS — K219 Gastro-esophageal reflux disease without esophagitis: Secondary | ICD-10-CM | POA: Insufficient documentation

## 2016-03-17 DIAGNOSIS — T3995XA Adverse effect of unspecified nonopioid analgesic, antipyretic and antirheumatic, initial encounter: Secondary | ICD-10-CM

## 2016-03-17 DIAGNOSIS — G4440 Drug-induced headache, not elsewhere classified, not intractable: Secondary | ICD-10-CM | POA: Insufficient documentation

## 2016-03-17 NOTE — Progress Notes (Signed)
Subjective:  Presents for recheck on her depression with anxiety. Has increased emotional lability lately. Has also noticed more headaches, having a nagging headache daily. About once a week will have right-sided dull throbbing headache with nausea dizziness and photosensitivity. Taking frequent Tylenol and ibuprofen for headache. Did well on Celexa in the past. Would like to restart this. Has reconciled with her husband, states her marriage is doing well at this point. While separated from her husband admits to partying and taking pain medication. Patient is basically stopped all opiate use with a rare use at this point. Wants to remain clean and sober. Patient also needs refill on her Protonix which she takes occasionally for her acid reflux.  Objective:   BP 108/76   Temp 98.4 F (36.9 C) (Oral)   Wt 149 lb (67.6 kg)   BMI 28.15 kg/m  NAD. Alert, oriented. Lungs clear. Heart regular rate rhythm. Mildly anxious affect. Thoughts logical coherent and relevant. Dressed appropriately. Making good eye contact. Tearful at times during exam. Abdomen soft nondistended nontender.  Assessment:   Problem List Items Addressed This Visit      Digestive   Gastroesophageal reflux disease without esophagitis     Other   Analgesic rebound headache   Relevant Medications   citalopram (CELEXA) 20 MG tablet   Depression with anxiety - Primary    Other Visit Diagnoses    Frequent headaches       Relevant Medications   citalopram (CELEXA) 20 MG tablet         Plan:  Meds ordered this encounter  Medications  . citalopram (CELEXA) 20 MG tablet    Sig: Take 1 tablet (20 mg total) by mouth daily.    Dispense:  90 tablet    Refill:  1    Change in directions    Order Specific Question:   Supervising Provider    Answer:   Merlyn AlbertLUKING, WILLIAM S [[id number]]  . DISCONTD: pantoprazole (PROTONIX) 40 MG tablet    Sig: Take 1 tablet (40 mg total) by mouth daily.    Dispense:  90 tablet    Refill:  1    Order  Specific Question:   Supervising Provider    Answer:   Merlyn AlbertLUKING, WILLIAM S [[id number]]     Restart Celexa as directed. Encourage patient to avoid all opiates in the future, only if necessary. Continue Protonix as directed when necessary acid reflux. Headaches are most likely due to stress combined with overuse of analgesics. Wean off analgesics, start Celexa and call back in 2 weeks if no improvement in headaches, sooner if worse. Warning signs were reviewed regarding headaches. Return in about 3 months (around 06/14/2016) for recheck.

## 2016-04-09 ENCOUNTER — Encounter: Payer: Self-pay | Admitting: Nurse Practitioner

## 2016-04-14 ENCOUNTER — Other Ambulatory Visit: Payer: Self-pay | Admitting: Nurse Practitioner

## 2016-04-14 DIAGNOSIS — F1120 Opioid dependence, uncomplicated: Secondary | ICD-10-CM

## 2016-05-06 ENCOUNTER — Encounter (HOSPITAL_COMMUNITY): Payer: Self-pay | Admitting: Emergency Medicine

## 2016-05-06 ENCOUNTER — Emergency Department (HOSPITAL_COMMUNITY)
Admission: EM | Admit: 2016-05-06 | Discharge: 2016-05-06 | Disposition: A | Discharge status: Home / Self Care | Payer: 59 | Attending: Emergency Medicine | Admitting: Emergency Medicine

## 2016-05-06 DIAGNOSIS — F1123 Opioid dependence with withdrawal: Secondary | ICD-10-CM | POA: Insufficient documentation

## 2016-05-06 DIAGNOSIS — F1120 Opioid dependence, uncomplicated: Secondary | ICD-10-CM

## 2016-05-06 DIAGNOSIS — Z79899 Other long term (current) drug therapy: Secondary | ICD-10-CM | POA: Diagnosis not present

## 2016-05-06 LAB — COMPREHENSIVE METABOLIC PANEL
ALT: 19 U/L (ref 14–54)
ANION GAP: 8 (ref 5–15)
AST: 29 U/L (ref 15–41)
Albumin: 4.2 g/dL (ref 3.5–5.0)
Alkaline Phosphatase: 39 U/L (ref 38–126)
BUN: 9 mg/dL (ref 6–20)
CALCIUM: 9.3 mg/dL (ref 8.9–10.3)
CO2: 24 mmol/L (ref 22–32)
Chloride: 107 mmol/L (ref 101–111)
Creatinine, Ser: 0.6 mg/dL (ref 0.44–1.00)
GLUCOSE: 101 mg/dL — AB (ref 65–99)
Potassium: 3.5 mmol/L (ref 3.5–5.1)
SODIUM: 139 mmol/L (ref 135–145)
Total Bilirubin: 0.7 mg/dL (ref 0.3–1.2)
Total Protein: 7.9 g/dL (ref 6.5–8.1)

## 2016-05-06 LAB — RAPID URINE DRUG SCREEN, HOSP PERFORMED
AMPHETAMINES: NOT DETECTED
BARBITURATES: NOT DETECTED
Benzodiazepines: POSITIVE — AB
Cocaine: NOT DETECTED
Opiates: POSITIVE — AB
TETRAHYDROCANNABINOL: POSITIVE — AB

## 2016-05-06 LAB — CBC
HCT: 34.9 % — ABNORMAL LOW (ref 36.0–46.0)
HEMOGLOBIN: 10.9 g/dL — AB (ref 12.0–15.0)
MCH: 23.7 pg — AB (ref 26.0–34.0)
MCHC: 31.2 g/dL (ref 30.0–36.0)
MCV: 75.9 fL — AB (ref 78.0–100.0)
Platelets: 359 10*3/uL (ref 150–400)
RBC: 4.6 MIL/uL (ref 3.87–5.11)
RDW: 15.8 % — ABNORMAL HIGH (ref 11.5–15.5)
WBC: 7.1 10*3/uL (ref 4.0–10.5)

## 2016-05-06 LAB — ETHANOL: Alcohol, Ethyl (B): <5 mg/dL (ref <5)

## 2016-05-06 MED ORDER — CLONIDINE HCL 0.1 MG PO TABS
0.1000 mg | ORAL_TABLET | Freq: Once | ORAL | Status: AC
  Administered 2016-05-06: 0.1 mg via ORAL
  Filled 2016-05-06: qty 1

## 2016-05-06 MED ORDER — CLONIDINE HCL 0.1 MG PO TABS: 0.1000 mg | ORAL_TABLET | Freq: Two times a day (BID) | ORAL | 0 refills | Status: DC

## 2016-05-06 NOTE — ED Notes (Signed)
Bed: [facility] Expected date:  Expected time:  Means of arrival:  Comments: 

## 2016-05-06 NOTE — ED Provider Notes (Signed)
[facility]-EMERGENCY DEPT Provider Note   CSN: [id number] Arrival date & time: 05/06/16  1623     History   Chief Complaint Chief Complaint  Patient presents with  . Detox    HPI [name] is a 36 y.o. female.  The history is provided by the patient.  Drug / Alcohol Assessment  Primary symptoms comment: chills, sweats, aching muscles. This is a new problem. Episode onset: 8 months of daily use stopped yesterday. The problem has not changed since onset.Suspected agents: pain pills and injecting oxcodone +dilaudid. Pertinent negatives include no fever, no nausea and no vomiting. Associated medical issues do not include addiction treatment.    Past Medical History:  Diagnosis Date  . Abnormal pap   . Anxiety   . Depression     Patient Active Problem List   Diagnosis Date Noted  . 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 Gastroesophageal reflux disease without esophagitis 03/17/2016  . Unspecified gastritis and gastroduodenitis without mention of hemorrhage 10/02/2013  . Depression with anxiety 06/22/2013    Past Surgical History:  Procedure Laterality Date  . CESAREAN SECTION    . left fallopian tube removal    . right tube removed     history of ectopic pregnancies    OB History    Gravida Para Term Preterm AB Living   4 1 1   3 1    SAB TAB Ectopic Multiple Live Births       3           Home Medications    Prior to Admission medications   Medication Sig Start Date End Date Taking? Authorizing Provider  citalopram (CELEXA) 20 MG tablet Take 1 tablet (20 mg total) by mouth daily. 03/16/16 03/16/17  Campbell Richesarolyn C Hoskins, NP  clonazePAM (KLONOPIN) 0.5 MG tablet Take 1/2 to 1 qd prn Patient not taking: Reported on 03/16/2016 10/03/15   Babs SciaraScott A Luking, MD  ibuprofen (ADVIL,MOTRIN) 800 MG tablet Take 1 tablet (800 mg total) by mouth 3 (three) times daily. 08/01/15   Eber HongBrian Miller, MD  ondansetron (ZOFRAN ODT) 4 MG disintegrating tablet Take 1 tablet (4 mg total) by  mouth every 8 (eight) hours as needed for nausea or vomiting. Patient not taking: Reported on 03/16/2016 06/28/15   Layla MawKristen N Ward, DO  pantoprazole (PROTONIX) 40 MG tablet Take 1 tablet (40 mg total) by mouth daily. 03/16/16   Campbell Richesarolyn C Hoskins, NP  promethazine (PHENERGAN) 25 MG tablet Take 1 tablet (25 mg total) by mouth every 6 (six) hours as needed for nausea or vomiting. Patient not taking: Reported on 03/16/2016 08/01/15   Eber HongBrian Miller, MD    Family History Family History  Problem Relation Age of Onset  . Hypertension Mother   . Cancer Maternal Grandmother     colon cancer  . Cancer Maternal Grandfather     colon cancer    Social History Social History  Substance Use Topics  . Smoking status: Never Smoker  . Smokeless tobacco: Never Used  . Alcohol use No     Allergies   Augmentin [amoxicillin-pot clavulanate]   Review of Systems Review of Systems  Constitutional: Negative for fever.  Gastrointestinal: Negative for nausea and vomiting.  All other systems reviewed and are negative.    Physical Exam Updated Vital Signs BP 174/94 (BP Location: Left Arm)   Pulse 94   Temp 98.1 F (36.7 C) (Oral)   Resp 18   Ht 5' (1.524 m)   Wt 148 lb (67.1 kg)  SpO2 100%   BMI 28.90 kg/m   Physical Exam  Constitutional: She is oriented to person, place, and time. She appears well-developed and well-nourished. No distress.  HENT:  Head: Normocephalic.  Nose: Nose normal.  Eyes: Conjunctivae are normal.  Neck: Neck supple. No tracheal deviation present.  Cardiovascular: Normal rate and regular rhythm.   Pulmonary/Chest: Effort normal. No respiratory distress.  Abdominal: Soft. She exhibits no distension.  Neurological: She is alert and oriented to person, place, and time.  Skin: Skin is warm and dry.  Psychiatric: She has a normal mood and affect. Thought content is not delusional. She expresses no homicidal and no suicidal ideation.  Vitals reviewed.    ED Treatments /  Results  Labs (all labs ordered are listed, but only abnormal results are displayed) Labs Reviewed  COMPREHENSIVE METABOLIC PANEL - Abnormal; Notable for the following:       Result Value   Glucose, Bld 101 (*)    All other components within normal limits  CBC - Abnormal; Notable for the following:    Hemoglobin 10.9 (*)    HCT 34.9 (*)    MCV 75.9 (*)    MCH 23.7 (*)    RDW 15.8 (*)    All other components within normal limits  RAPID URINE DRUG SCREEN, HOSP PERFORMED - Abnormal; Notable for the following:    Opiates POSITIVE (*)    Benzodiazepines POSITIVE (*)    Tetrahydrocannabinol POSITIVE (*)    All other components within normal limits  ETHANOL    EKG  EKG Interpretation None       Radiology No results found.  Procedures Procedures (including critical care time)  Medications Ordered in ED Medications - No data to display   Initial Impression / Assessment and Plan / ED Course  I have reviewed the triage vital signs and the nursing notes.  Pertinent labs & imaging results that were available during my care of the patient were reviewed by me and considered in my medical decision making (see chart for details).     36 y.o. female presents with ongoing opiate abuse. She was in an enabling relationship recently. She is employed here as a Engineer, civil (consulting) currently. Yesterday was last use and she is currently experiencing mild withdrawal symptoms. She denies SI/HI/AVH and wants help for addiction recovery. We discussed options for her and she preferred to be home with her family and was provided resources for outpatient and inpatient rehabilitation for addiction. I explained to her that she was not under any coercion to stay from her employer or myself and that TTS evaluation for possible placement versus outpatient treatment would have no bearing on her employment status. I also reviewed HIPAA with her and that this confidential visit should have no bearing on her employment as  her addiction treatment is not a part of that outcome and has legal privacy protection. She was given clonidine here for withdrawal and provided a short-term prescription to help with symptoms at home. Plan to follow up with PCP as needed and return precautions discussed for worsening or new concerning symptoms.   Final Clinical Impressions(s) / ED Diagnoses   Final diagnoses:  Narcotic dependence (HCC)    New Prescriptions Discharge Medication List as of 05/06/2016  7:10 PM    START taking these medications   Details  cloNIDine (CATAPRES) 0.1 MG tablet Take 1 tablet (0.1 mg total) by mouth 2 (two) times daily., Starting Wed 05/06/2016, Print  Lyndal Pulley, MD 05/07/16 [phone]

## 2016-05-06 NOTE — ED Triage Notes (Addendum)
Pt comes with withdrawals from opiates (IV and PO use).  Last use was yesterday. Endorses depression but denies SI/HI.  Symptoms currently reported as chills and anxiety.  Pt tearful during triage.  Denies alcohol use. Therapist, occupationalmployee Assistance Counselor at bedside.

## 2016-05-06 NOTE — ED Notes (Signed)
Bed: [id number] Expected date:  Expected time:  Means of arrival:  Comments: Hold for [facility]

## 2016-05-06 NOTE — ED Notes (Signed)
Patient is A & O x4.  She understood AVS paper.  No questions asked.

## 2016-05-06 NOTE — ED Notes (Signed)
Employee Assistance Counseling program with [employer] states patient is going to lose her job due to stealing pain medication and will not pass her drug test.  It is [facility] from EAP [facility]. Patient has been using opoid's for 9 months, she is in withdrawal since this morning.  EAP is afraid patient will become SI once she is told about job status, next 24 hours is critical.

## 2016-05-12 ENCOUNTER — Telehealth: Payer: Self-pay | Admitting: Family Medicine

## 2016-05-12 NOTE — Telephone Encounter (Signed)
(  Message for Carolyn) patient spouse dropped off FMLA 05/09/2016 explained needed to know days out of work he stated uncertain on those so filled in the best you can. In box in your office. °

## 2016-05-14 DIAGNOSIS — F1120 Opioid dependence, uncomplicated: Secondary | ICD-10-CM | POA: Diagnosis not present

## 2016-05-14 MED FILL — cloNIDine HCL 0.1 MG TABS: 0.1 | 3 days supply | Qty: 6 | Fill #0

## 2016-05-14 NOTE — Telephone Encounter (Signed)
(  Message for Charlene JonesCarolyn) patient spouse dropped off FMLA 05/09/2016 explained needed to know days out of work he stated uncertain on those so filled in the best you can. In box in your office.

## 2016-05-15 DIAGNOSIS — F1120 Opioid dependence, uncomplicated: Secondary | ICD-10-CM | POA: Diagnosis not present

## 2016-05-17 ENCOUNTER — Encounter: Payer: Self-pay | Admitting: Nurse Practitioner

## 2016-05-18 ENCOUNTER — Other Ambulatory Visit: Payer: Self-pay | Admitting: Nurse Practitioner

## 2016-05-18 MED ORDER — CITALOPRAM HYDROBROMIDE 20 MG PO TABS: ORAL_TABLET | ORAL | 0 refills | Status: DC

## 2016-05-18 MED FILL — CITALOPRAM HBR 20 MG TABLET: 20 | 90 days supply | Qty: 135 | Fill #0

## 2016-05-19 DIAGNOSIS — F1120 Opioid dependence, uncomplicated: Secondary | ICD-10-CM | POA: Diagnosis not present

## 2016-05-21 ENCOUNTER — Encounter: Payer: Self-pay | Admitting: Nurse Practitioner

## 2016-05-21 DIAGNOSIS — F1120 Opioid dependence, uncomplicated: Secondary | ICD-10-CM | POA: Diagnosis not present

## 2016-05-25 ENCOUNTER — Other Ambulatory Visit: Payer: Self-pay | Admitting: Nurse Practitioner

## 2016-05-25 MED ORDER — CLONIDINE HCL 0.1 MG PO TABS: 0.1000 mg | ORAL_TABLET | Freq: Two times a day (BID) | ORAL | 2 refills | Status: DC

## 2016-05-26 DIAGNOSIS — F1120 Opioid dependence, uncomplicated: Secondary | ICD-10-CM | POA: Diagnosis not present

## 2016-05-27 NOTE — Telephone Encounter (Signed)
done

## 2016-06-02 DIAGNOSIS — F1120 Opioid dependence, uncomplicated: Secondary | ICD-10-CM | POA: Diagnosis not present

## 2016-06-15 ENCOUNTER — Ambulatory Visit: Payer: 59 | Admitting: Nurse Practitioner

## 2016-06-15 DIAGNOSIS — Z029 Encounter for administrative examinations, unspecified: Secondary | ICD-10-CM

## 2016-06-16 ENCOUNTER — Encounter: Payer: Self-pay | Admitting: Family Medicine

## 2017-01-25 ENCOUNTER — Encounter: Payer: Self-pay | Admitting: Family Medicine

## 2017-05-11 ENCOUNTER — Inpatient Hospital Stay (HOSPITAL_COMMUNITY)
Admission: AD | Admit: 2017-05-11 | Discharge: 2017-05-11 | Disposition: A | Discharge status: Home / Self Care | Payer: Self-pay | Source: Ambulatory Visit | Attending: Obstetrics and Gynecology | Admitting: Obstetrics and Gynecology

## 2017-05-11 ENCOUNTER — Encounter (HOSPITAL_COMMUNITY): Payer: Self-pay | Admitting: *Deleted

## 2017-05-11 DIAGNOSIS — R300 Dysuria: Secondary | ICD-10-CM | POA: Insufficient documentation

## 2017-05-11 DIAGNOSIS — N898 Other specified noninflammatory disorders of vagina: Secondary | ICD-10-CM | POA: Insufficient documentation

## 2017-05-11 DIAGNOSIS — Z8619 Personal history of other infectious and parasitic diseases: Secondary | ICD-10-CM | POA: Insufficient documentation

## 2017-05-11 DIAGNOSIS — Z90722 Acquired absence of ovaries, bilateral: Secondary | ICD-10-CM | POA: Insufficient documentation

## 2017-05-11 DIAGNOSIS — R102 Pelvic and perineal pain: Secondary | ICD-10-CM | POA: Insufficient documentation

## 2017-05-11 DIAGNOSIS — Z8759 Personal history of other complications of pregnancy, childbirth and the puerperium: Secondary | ICD-10-CM | POA: Insufficient documentation

## 2017-05-11 DIAGNOSIS — R21 Rash and other nonspecific skin eruption: Secondary | ICD-10-CM | POA: Insufficient documentation

## 2017-05-11 LAB — URINALYSIS, ROUTINE W REFLEX MICROSCOPIC
Bilirubin Urine: NEGATIVE
Glucose, UA: NEGATIVE mg/dL
Ketones, ur: NEGATIVE mg/dL
Nitrite: NEGATIVE
PH: 5 (ref 5.0–8.0)
Protein, ur: 30 mg/dL — AB
SPECIFIC GRAVITY, URINE: 1.026 (ref 1.005–1.030)

## 2017-05-11 LAB — POCT PREGNANCY, URINE: Preg Test, Ur: NEGATIVE

## 2017-05-11 LAB — WET PREP, GENITAL
Clue Cells Wet Prep HPF POC: NONE SEEN
SPERM: NONE SEEN
TRICH WET PREP: NONE SEEN
YEAST WET PREP: NONE SEEN

## 2017-05-11 NOTE — MAU Provider Note (Signed)
History     CSN: [id number]  Arrival date and time: 05/11/17 [id number]   First Provider Initiated Contact with Patient 05/11/17 1018      Chief Complaint  Patient presents with  . Vaginal Discharge  . Vaginal Itching   37 y.o. non-pregnant female here with vaginal discharge and pain. Sx started 4 months ago. Describes discharge at creamy and malodorous. Causes some itching and irritation. No new partner. Remote hx of trich. No new skin products or detergents. Hx of bilateral salpingectomy.     Past Medical History:  Diagnosis Date  . Abnormal pap   . Anxiety   . Depression     Past Surgical History:  Procedure Laterality Date  . CESAREAN SECTION    . left fallopian tube removal    . right tube removed     history of ectopic pregnancies    Family History  Problem Relation Age of Onset  . Hypertension Mother   . Cancer Maternal Grandmother        colon cancer  . Cancer Maternal Grandfather        colon cancer    Social History   Tobacco Use  . Smoking status: Never Smoker  . Smokeless tobacco: Never Used  Substance Use Topics  . Alcohol use: No    Alcohol/week: 0.6 oz    Types: 1 Glasses of wine per week  . Drug use: Yes    Types: Benzodiazepines, Marijuana    Comment: opiates    Allergies:  Allergies  Allergen Reactions  . Augmentin [Amoxicillin-Pot Clavulanate] Other (See Comments)    Abdominal Pain    Medications Prior to Admission  Medication Sig Dispense Refill Last Dose  . buprenorphine-naloxone (SUBOXONE) 8-2 mg SUBL SL tablet Place 0.5 tablets under the tongue daily.   05/10/2017 at Unknown time  . citalopram (CELEXA) 40 MG tablet Take 40 mg by mouth at bedtime.   05/10/2017 at Unknown time  . ibuprofen (ADVIL,MOTRIN) 200 MG tablet Take 600 mg by mouth every 6 (six) hours as needed for headache, moderate pain or cramping.   05/11/2017 at Unknown time  . Multiple Vitamin (MULTIVITAMIN WITH MINERALS) TABS tablet Take 1 tablet by mouth daily.    05/10/2017 at Unknown time    Review of Systems  Gastrointestinal: Negative for abdominal pain.  Genitourinary: Positive for vaginal discharge and vaginal pain. Negative for dysuria, frequency, hematuria and pelvic pain.   Physical Exam   Blood pressure 133/76, pulse 76, temperature 99 F (37.2 C), temperature source Oral, resp. rate 16, height 5' (1.524 m), weight 162 lb (73.5 kg), last menstrual period 03/21/2017, SpO2 100 %.  Physical Exam  Constitutional: She is oriented to person, place, and time. She appears well-developed and well-nourished. No distress.  HENT:  Head: Atraumatic.  Neck: Normal range of motion.  Cardiovascular: Normal rate.  Respiratory: Effort normal.  Genitourinary:     Genitourinary Comments: External: spotty erythema scattered over vulva Vagina: rugated, pink, moist, small thin white discharge Uterus: non enlarged, anteverted, non tender, no CMT Adnexae: no masses, no tenderness left, no tenderness right    Musculoskeletal: Normal range of motion.  Neurological: She is alert and oriented to person, place, and time.  Skin: Skin is warm and dry.  Psychiatric: She has a normal mood and affect.   Results for orders placed or performed during the hospital encounter of 05/11/17 (from the past 24 hour(s))  Urinalysis, Routine w reflex microscopic     Status: Abnormal   Collection Time:  05/11/17  9:51 AM  Result Value Ref Range   Color, Urine YELLOW YELLOW   APPearance CLOUDY (A) CLEAR   Specific Gravity, Urine 1.026 1.005 - 1.030   pH 5.0 5.0 - 8.0   Glucose, UA NEGATIVE NEGATIVE mg/dL   Hgb urine dipstick SMALL (A) NEGATIVE   Bilirubin Urine NEGATIVE NEGATIVE   Ketones, ur NEGATIVE NEGATIVE mg/dL   Protein, ur 30 (A) NEGATIVE mg/dL   Nitrite NEGATIVE NEGATIVE   Leukocytes, UA LARGE (A) NEGATIVE   RBC / HPF 6-30 0 - 5 RBC/hpf   WBC, UA 6-30 0 - 5 WBC/hpf   Bacteria, UA RARE (A) NONE SEEN   Squamous Epithelial / LPF 6-30 (A) NONE SEEN   Mucus  PRESENT   Pregnancy, urine POC     Status: None   Collection Time: 05/11/17 10:01 AM  Result Value Ref Range   Preg Test, Ur NEGATIVE NEGATIVE  Wet prep, genital     Status: Abnormal   Collection Time: 05/11/17 10:40 AM  Result Value Ref Range   Yeast Wet Prep HPF POC NONE SEEN NONE SEEN   Trich, Wet Prep NONE SEEN NONE SEEN   Clue Cells Wet Prep HPF POC NONE SEEN NONE SEEN   WBC, Wet Prep HPF POC MANY (A) NONE SEEN   Sperm NONE SEEN    MAU Course  Procedures  MDM Labs ordered and reviewed. No evidence of BV or YV. GC pending. Recommend OTC ph stabilizer, cool compresses, and sitz baths with baking soda. Stable for discharge home.  Assessment and Plan   1. Vaginal irritation    Discharge home Follow up with PCP or GYN prn Return to MAU for OBGYN emergencies  Allergies as of 05/11/2017      Reactions   Augmentin [amoxicillin-pot Clavulanate] Other (See Comments)   Abdominal Pain      Medication List    TAKE these medications   buprenorphine-naloxone 8-2 mg Subl SL tablet Commonly known as:  SUBOXONE Place 0.5 tablets under the tongue daily.   citalopram 40 MG tablet Commonly known as:  CELEXA Take 40 mg by mouth at bedtime.   ibuprofen 200 MG tablet Commonly known as:  ADVIL,MOTRIN Take 600 mg by mouth every 6 (six) hours as needed for headache, moderate pain or cramping.   multivitamin with minerals Tabs tablet Take 1 tablet by mouth daily.      Donette Larry, CNM 05/11/2017, 11:12 AM

## 2017-05-11 NOTE — Discharge Instructions (Signed)
In late 2019, the [facility] will be moving to the [place] campus. At that time, the MAU (Maternity Admissions Unit), where you are being seen today, will no longer take care of non-pregnant patients. We strongly encourage you to find a doctor's office before that time, so that you can be seen with any GYN concerns, like vaginal discharge, urinary tract infection, etc.. in a timely manner. ° °In order to make an office visit more convenient, the [facility] will be offering evening hours with same-day appointments, walk-in appointments and scheduled appointments available during this time. ° °[facility] Hours: °Monday - 8am - 7:30 pm with walk-in between 4pm- 7:30 pm °Tuesday - 8 am - 5 pm (starting 05/25/17 we will be open late and accepting walk-ins from 4pm - 7:30pm) °Wednesday - 8 am - 5 pm (starting 08/25/17 we will be open late and accepting walk-ins from 4pm - 7:30pm) °Thursday 8 am - 5 pm (starting 11/25/17 we will be open late and accepting walk-ins from 4pm - 7:30pm) °Friday 8 am - 5 pm ° °For an appointment please call the [facility] at [phone] ° °For urgent needs, [facility] is also available for management of urgent GYN complaints such as vaginal discharge or urinary tract infections. ° ° ° ° ° °

## 2017-05-11 NOTE — MAU Provider Note (Signed)
CC: vaginal pain  HPI: [name] is a 36yo woman with a PMH of bilateral salpingectomy for recurrent ectopic pregnancies who presents with complaints of vaginal irritation and discharge. She describes a 4 month history of superficial vulvar pain, pruritus, and a "spot" that comes and goes and can bleed. This is accompanied by white creamy discharge that has an odor. She has occasional dysuria but she thinks this is due to the excoriations. She has had one sexual partner but has not has intercourse in the past 3 months due to pain. She has a history of trichomonas with this same partner but has no current concerns for STI. She denies vaginal bleeding, abdominal pain, fever or chills.  O:  Physical Exam  Constitutional: She is oriented to person, place, and time. She appears well-developed.  Genitourinary: Uterus normal.  There is rash on the right labia.  There is rash on the left labia.  Genitourinary Comments: An 8mm excoriation on the left side.   Cardiovascular: Normal rate and regular rhythm. Exam reveals no gallop and no friction rub.  No murmur heard. Abdominal: Soft. There is no tenderness. There is no guarding.  Neurological: She is alert and oriented to person, place, and time.   Wet prep showed many WBCs but was negative for trich, clue cells and yeast.   Urinalysis    Component Value Date/Time   COLORURINE YELLOW 05/11/2017 0951   APPEARANCEUR CLOUDY (A) 05/11/2017 0951   LABSPEC 1.026 05/11/2017 0951   PHURINE 5.0 05/11/2017 0951   GLUCOSEU NEGATIVE 05/11/2017 0951   HGBUR SMALL (A) 05/11/2017 0951   BILIRUBINUR NEGATIVE 05/11/2017 0951   KETONESUR NEGATIVE 05/11/2017 0951   PROTEINUR 30 (A) 05/11/2017 0951   NITRITE NEGATIVE 05/11/2017 0951   LEUKOCYTESUR LARGE (A) 05/11/2017 0951   A/P: [name] is a 37 yo F who presents with complaints of vaginal irritation and discharge. Most likely to be a dermatological irritation and excoriation but STI testing is pending.   Plan:  -G/C pending -Recommended sitz baths and cool compress as well as avoiding irritants and shaving.

## 2017-05-11 NOTE — MAU Note (Signed)
Pt states she has been having vaginal irritation, sores, and discharge off/on for 4 months. Used OTC's at different times but it doesn't help.

## 2017-05-12 LAB — GC/CHLAMYDIA PROBE AMP (~~LOC~~) NOT AT ARMC
CHLAMYDIA, DNA PROBE: NEGATIVE
NEISSERIA GONORRHEA: NEGATIVE

## 2017-10-04 ENCOUNTER — Other Ambulatory Visit: Payer: Self-pay

## 2017-10-04 ENCOUNTER — Emergency Department (HOSPITAL_COMMUNITY): Payer: Self-pay

## 2017-10-04 ENCOUNTER — Encounter (HOSPITAL_COMMUNITY): Payer: Self-pay

## 2017-10-04 ENCOUNTER — Emergency Department (HOSPITAL_COMMUNITY)
Admission: EM | Admit: 2017-10-04 | Discharge: 2017-10-04 | Disposition: A | Discharge status: Home / Self Care | Payer: Self-pay | Attending: Emergency Medicine | Admitting: Emergency Medicine

## 2017-10-04 DIAGNOSIS — Z79899 Other long term (current) drug therapy: Secondary | ICD-10-CM | POA: Insufficient documentation

## 2017-10-04 DIAGNOSIS — R0789 Other chest pain: Secondary | ICD-10-CM | POA: Insufficient documentation

## 2017-10-04 LAB — BASIC METABOLIC PANEL
Anion gap: 9 (ref 5–15)
BUN: 9 mg/dL (ref 6–20)
CHLORIDE: 106 mmol/L (ref 98–111)
CO2: 25 mmol/L (ref 22–32)
CREATININE: 0.68 mg/dL (ref 0.44–1.00)
Calcium: 8.7 mg/dL — ABNORMAL LOW (ref 8.9–10.3)
GFR calc Af Amer: >60 mL/min (ref >60)
GFR calc non Af Amer: >60 mL/min (ref >60)
GLUCOSE: 107 mg/dL — AB (ref 70–99)
POTASSIUM: 3.4 mmol/L — AB (ref 3.5–5.1)
Sodium: 140 mmol/L (ref 135–145)

## 2017-10-04 LAB — CBC
HEMATOCRIT: 31.1 % — AB (ref 36.0–46.0)
Hemoglobin: 8.7 g/dL — ABNORMAL LOW (ref 12.0–15.0)
MCH: 20.1 pg — AB (ref 26.0–34.0)
MCHC: 28 g/dL — ABNORMAL LOW (ref 30.0–36.0)
MCV: 72 fL — AB (ref 78.0–100.0)
PLATELETS: 318 10*3/uL (ref 150–400)
RBC: 4.32 MIL/uL (ref 3.87–5.11)
RDW: 18 % — AB (ref 11.5–15.5)
WBC: 8 10*3/uL (ref 4.0–10.5)

## 2017-10-04 LAB — I-STAT BETA HCG BLOOD, ED (MC, WL, AP ONLY): I-stat hCG, quantitative: <5 m[IU]/mL (ref <5)

## 2017-10-04 LAB — I-STAT TROPONIN, ED: TROPONIN I, POC: 0 ng/mL (ref 0.00–0.08)

## 2017-10-04 MED ORDER — ASPIRIN 81 MG PO CHEW: 324.0000 mg | CHEWABLE_TABLET | Freq: Once | ORAL | Status: DC

## 2017-10-04 MED ORDER — PREDNISONE 10 MG PO TABS: ORAL_TABLET | ORAL | 0 refills | Status: DC

## 2017-10-04 MED ORDER — KETOROLAC TROMETHAMINE 30 MG/ML IJ SOLN
15.0000 mg | Freq: Once | INTRAMUSCULAR | Status: AC
  Administered 2017-10-04: 15 mg via INTRAVENOUS
  Filled 2017-10-04: qty 1

## 2017-10-04 NOTE — ED Notes (Signed)
Patient given discharge instruction, verbalized understand. Patient ambulatory out of the department.  

## 2017-10-04 NOTE — ED Provider Notes (Signed)
[facility] EMERGENCY DEPARTMENT Provider Note   CSN: [id number] Arrival date & time: 10/04/17  6213     History   Chief Complaint Chief Complaint  Patient presents with  . Chest Pain    HPI [name] is a 37 y.o. female with a history of GERD, anxiety and depression, presenting with a 4-day history of right-sided chest pain.  She describes sharp stabbing pain to the right of her sternum which is been intermittent, triggered with movement, exertion and deep inspiration.  She denies shortness of breath, except pain limits deep inspiration.  She was at the beach last week and describes on 2 different occasions having to carry heavy luggage up steps after which she started to experience her symptoms.  She denies palpation, dizziness.  She did have one episode of nausea with emesis 2 mornings ago, none since.  She has taken ibuprofen which improves but does not eliminate her pain is a non-smoker, denies cocaine use, no significant family history of CAD.  The history is provided by the patient.    Past Medical History:  Diagnosis Date  . Abnormal pap   . Anxiety   . Depression     Patient Active Problem List   Diagnosis Date Noted  . 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 Gastroesophageal reflux disease without esophagitis 03/17/2016  . Unspecified gastritis and gastroduodenitis without mention of hemorrhage 10/02/2013  . Depression with anxiety 06/22/2013    Past Surgical History:  Procedure Laterality Date  . CESAREAN SECTION    . left fallopian tube removal    . right tube removed     history of ectopic pregnancies     OB History    Gravida  4   Para  1   Term  1   Preterm      AB  3   Living  1     SAB      TAB      Ectopic  3   Multiple      Live Births               Home Medications    Prior to Admission medications   Medication Sig Start Date End Date Taking? Authorizing Provider  buprenorphine-naloxone (SUBOXONE) 8-2 mg SUBL  SL tablet Place 0.5 tablets under the tongue daily.   Yes [provider]  citalopram (CELEXA) 40 MG tablet Take 40 mg by mouth at bedtime.   Yes [provider]  ibuprofen (ADVIL,MOTRIN) 200 MG tablet Take 600 mg by mouth every 6 (six) hours as needed for headache, moderate pain or cramping.   Yes [provider]  Multiple Vitamin (MULTIVITAMIN WITH MINERALS) TABS tablet Take 1 tablet by mouth daily.   Yes [provider]  traZODone (DESYREL) 100 MG tablet Take 1 tablet by mouth at bedtime as needed.   Yes [provider]  predniSONE (DELTASONE) 10 MG tablet Take 6 tablets day one, 5 tablets day two, 4 tablets day three, 3 tablets day four, 2 tablets day five, then 1 tablet day six 10/04/17   Burgess Amor, PA-C    Family History Family History  Problem Relation Age of Onset  . Hypertension Mother   . Cancer Maternal Grandmother        colon cancer  . Cancer Maternal Grandfather        colon cancer    Social History Social History   Tobacco Use  . Smoking status: Never Smoker  . Smokeless tobacco:  Never Used  Substance Use Topics  . Alcohol use: No    Alcohol/week: 1.0 standard drinks    Types: 1 Glasses of wine per week  . Drug use: Yes    Types: Benzodiazepines, Marijuana    Comment: opiates- Former     Allergies   Augmentin [amoxicillin-pot clavulanate]   Review of Systems Review of Systems  Constitutional: Negative for chills and fever.  HENT: Negative for congestion.   Eyes: Negative.   Respiratory: Negative for cough, chest tightness and shortness of breath.   Cardiovascular: Positive for chest pain.  Gastrointestinal: Positive for nausea and vomiting. Negative for abdominal pain.  Genitourinary: Negative.   Musculoskeletal: Negative for arthralgias, joint swelling and neck pain.  Skin: Negative.  Negative for rash and wound.  Neurological: Negative for dizziness, weakness, light-headedness, numbness and headaches.    Psychiatric/Behavioral: Negative.      Physical Exam Updated Vital Signs BP 126/68   Pulse 77   Temp 98.5 F (36.9 C) (Oral)   Resp 19   Ht 5' (1.524 m)   Wt 74.8 kg   LMP 09/13/2017   SpO2 98%   BMI 32.22 kg/m   Physical Exam  Constitutional: She appears well-developed and well-nourished.  HENT:  Head: Normocephalic and atraumatic.  Eyes: Conjunctivae are normal.  Neck: Normal range of motion.  Cardiovascular: Normal rate, regular rhythm, normal heart sounds and intact distal pulses.  Pulmonary/Chest: Effort normal and breath sounds normal. She has no decreased breath sounds. She has no wheezes. She has no rhonchi. She has no rales. She exhibits bony tenderness.    Abdominal: Soft. Bowel sounds are normal. There is no tenderness. There is no rigidity, no guarding and negative Murphy's sign.  Musculoskeletal: Normal range of motion.  Neurological: She is alert.  Skin: Skin is warm and dry.  Psychiatric: She has a normal mood and affect.  Nursing note and vitals reviewed.    ED Treatments / Results  Labs (all labs ordered are listed, but only abnormal results are displayed) Labs Reviewed  BASIC METABOLIC PANEL - Abnormal; Notable for the following components:      Result Value   Potassium 3.4 (*)    Glucose, Bld 107 (*)    Calcium 8.7 (*)    All other components within normal limits  CBC - Abnormal; Notable for the following components:   Hemoglobin 8.7 (*)    HCT 31.1 (*)    MCV 72.0 (*)    MCH 20.1 (*)    MCHC 28.0 (*)    RDW 18.0 (*)    All other components within normal limits  I-STAT BETA HCG BLOOD, ED ([facility], [facility], [facility] ONLY)  I-STAT TROPONIN, ED    EKG None   ED ECG REPORT   Date: 10/04/2017  Rate: 69  Rhythm: normal sinus rhythm  QRS Axis: normal  Intervals: PR short  ST/T Wave abnormalities: normal  Conduction Disutrbances:none  Narrative Interpretation:   Old EKG Reviewed: unchanged  I have personally reviewed the EKG tracing and agree  with the computerized printout as noted.   Radiology Dg Chest 2 View  Result Date: 10/04/2017 CLINICAL DATA:  Acute chest pain since Saturday EXAM: CHEST - 2 VIEW COMPARISON:  Jun 28, 2015 FINDINGS: The heart size and mediastinal contours are within normal limits. There is no focal infiltrate, pulmonary edema, or pleural effusion. The visualized skeletal structures are unremarkable. IMPRESSION: No active cardiopulmonary disease. Electronically Signed   By: Sherian ReinWei-Chen  Lin M.D.   On: 10/04/2017 10:36  Procedures Procedures (including critical care time)  Medications Ordered in ED Medications  ketorolac (TORADOL) 30 MG/ML injection 15 mg (15 mg Intravenous Given 10/04/17 1036)     Initial Impression / Assessment and Plan / ED Course  I have reviewed the triage vital signs and the nursing notes.  Pertinent labs & imaging results that were available during my care of the patient were reviewed by me and considered in my medical decision making (see chart for details).     Exam, history and labs including chest x-ray and EKG today suggesting chest wall pain/strain, probably from her lifting activities while on vacation at the beach.  Her chest pain is reproducible.  Her vital signs are stable, no shortness of breath, doubt PE.  No ACS per negative troponin and normal EKG after 4 days of pain.  She was encouraged to continue taking her ibuprofen, added a prednisone taper, discussed heat therapy.  Plan follow-up with her PCP for recheck if not improving over the next week.  She was given a work note.  Final Clinical Impressions(s) / ED Diagnoses   Final diagnoses:  Right-sided chest wall pain    ED Discharge Orders         Ordered    predniSONE (DELTASONE) 10 MG tablet     10/04/17 1137           Burgess Amordol, Davonne Baby, PA-C 10/04/17 1146    Benjiman CorePickering, Nathan, MD 10/04/17 1505

## 2017-10-04 NOTE — Discharge Instructions (Addendum)
You may continue using your ibuprofen since this partially relieves your symptoms.  You have also been prescribed a prednisone taper - take with food.  Apply a heating pad to your chest wall as discussed which may help this heal quicker.  Your labs, chest xray and ekg today are normal. Your exam is consist with a pulled muscle or rib cage soreness, probably from your lifting incident last week.

## 2017-10-04 NOTE — ED Notes (Signed)
Back from xray

## 2017-10-04 NOTE — ED Triage Notes (Signed)
Pt reports pain in r chest that is worse with movement and palpation.   Reports nausea, denies any sob.  PT says has been to the beach recently and carried luggage up and down steps.

## 2018-03-01 ENCOUNTER — Other Ambulatory Visit: Payer: Self-pay

## 2018-03-01 ENCOUNTER — Emergency Department (HOSPITAL_COMMUNITY): Payer: Self-pay

## 2018-03-01 ENCOUNTER — Encounter (HOSPITAL_COMMUNITY): Payer: Self-pay | Admitting: *Deleted

## 2018-03-01 ENCOUNTER — Emergency Department (HOSPITAL_COMMUNITY)
Admission: EM | Admit: 2018-03-01 | Discharge: 2018-03-01 | Disposition: A | Discharge status: Home / Self Care | Payer: Self-pay | Attending: Emergency Medicine | Admitting: Emergency Medicine

## 2018-03-01 DIAGNOSIS — R079 Chest pain, unspecified: Secondary | ICD-10-CM | POA: Insufficient documentation

## 2018-03-01 DIAGNOSIS — Z79899 Other long term (current) drug therapy: Secondary | ICD-10-CM | POA: Insufficient documentation

## 2018-03-01 LAB — CBC WITH DIFFERENTIAL/PLATELET
Abs Immature Granulocytes: 0.03 10*3/uL (ref 0.00–0.07)
BASOS ABS: 0 10*3/uL (ref 0.0–0.1)
BASOS PCT: 0 %
EOS ABS: 0.1 10*3/uL (ref 0.0–0.5)
EOS PCT: 2 %
HCT: 34.1 % — ABNORMAL LOW (ref 36.0–46.0)
Hemoglobin: 9.3 g/dL — ABNORMAL LOW (ref 12.0–15.0)
IMMATURE GRANULOCYTES: 0 %
LYMPHS ABS: 1.8 10*3/uL (ref 0.7–4.0)
Lymphocytes Relative: 25 %
MCH: 20 pg — ABNORMAL LOW (ref 26.0–34.0)
MCHC: 27.3 g/dL — AB (ref 30.0–36.0)
MCV: 73.2 fL — ABNORMAL LOW (ref 80.0–100.0)
Monocytes Absolute: 0.4 10*3/uL (ref 0.1–1.0)
Monocytes Relative: 6 %
NEUTROS PCT: 67 %
NRBC: 0 % (ref 0.0–0.2)
Neutro Abs: 4.9 10*3/uL (ref 1.7–7.7)
PLATELETS: 336 10*3/uL (ref 150–400)
RBC: 4.66 MIL/uL (ref 3.87–5.11)
RDW: 20.1 % — ABNORMAL HIGH (ref 11.5–15.5)
WBC: 7.3 10*3/uL (ref 4.0–10.5)

## 2018-03-01 LAB — COMPREHENSIVE METABOLIC PANEL
ALK PHOS: 51 U/L (ref 38–126)
ALT: 14 U/L (ref 0–44)
ANION GAP: 8 (ref 5–15)
AST: 31 U/L (ref 15–41)
Albumin: 3.8 g/dL (ref 3.5–5.0)
BUN: 7 mg/dL (ref 6–20)
CALCIUM: 9.4 mg/dL (ref 8.9–10.3)
CO2: 24 mmol/L (ref 22–32)
Chloride: 105 mmol/L (ref 98–111)
Creatinine, Ser: 0.79 mg/dL (ref 0.44–1.00)
Glucose, Bld: 111 mg/dL — ABNORMAL HIGH (ref 70–99)
Potassium: 3.7 mmol/L (ref 3.5–5.1)
SODIUM: 137 mmol/L (ref 135–145)
Total Bilirubin: 0.4 mg/dL (ref 0.3–1.2)
Total Protein: 7.5 g/dL (ref 6.5–8.1)

## 2018-03-01 LAB — I-STAT BETA HCG BLOOD, ED (MC, WL, AP ONLY): I-stat hCG, quantitative: <5 m[IU]/mL (ref <5)

## 2018-03-01 LAB — I-STAT TROPONIN, ED: TROPONIN I, POC: 0 ng/mL (ref 0.00–0.08)

## 2018-03-01 LAB — LIPASE, BLOOD: LIPASE: 41 U/L (ref 11–51)

## 2018-03-01 MED ORDER — MORPHINE SULFATE (PF) 4 MG/ML IV SOLN
4.0000 mg | Freq: Once | INTRAVENOUS | Status: AC
  Administered 2018-03-01: 4 mg via INTRAVENOUS
  Filled 2018-03-01: qty 1

## 2018-03-01 MED ORDER — KETOROLAC TROMETHAMINE 30 MG/ML IJ SOLN
30.0000 mg | Freq: Once | INTRAMUSCULAR | Status: AC
  Administered 2018-03-01: 30 mg via INTRAVENOUS
  Filled 2018-03-01: qty 1

## 2018-03-01 NOTE — Discharge Instructions (Addendum)
Ibuprofen 600 mg every 6 hours as needed for pain.  Follow-up with your primary doctor if not improving in the next 3 to 4 days, and return to the ER if symptoms significantly worsen or change.

## 2018-03-01 NOTE — ED Triage Notes (Signed)
Pt c/o chest pain that radiates to her left shoulder; pt states the pain woke her up from sleep;

## 2018-03-01 NOTE — ED Provider Notes (Signed)
[facility]ERGENCY DEPARTMENT Provider Note   CSN: [id number] Arrival date & time: 03/01/18  0205     History   Chief Complaint Chief Complaint  Patient presents with  . Chest Pain    HPI [name] is a 38 y.o. female.  Patient is a 38 year old female with history of anxiety and depression.  She presents today for evaluation of chest pain.  This woke her from sleep at approximately 1 AM.  She describes sharp pain in the center of her chest and behind her left breast.  She denies any shortness of breath, nausea, diaphoresis, or radiation to the arm or jaw.  She denies any recent exertional symptoms.  The history is provided by the patient.  Chest Pain  Pain location:  Substernal area Pain quality: sharp   Pain radiates to:  Does not radiate Pain severity:  Moderate Onset quality:  Sudden Timing:  Constant Progression:  Unchanged Relieved by:  Nothing Worsened by:  Nothing   Past Medical History:  Diagnosis Date  . Abnormal pap   . Anxiety   . Depression     Patient Active Problem List   Diagnosis Date Noted  . 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 Gastroesophageal reflux disease without esophagitis 03/17/2016  . Unspecified gastritis and gastroduodenitis without mention of hemorrhage 10/02/2013  . Depression with anxiety 06/22/2013    Past Surgical History:  Procedure Laterality Date  . CESAREAN SECTION    . left fallopian tube removal    . right tube removed     history of ectopic pregnancies     OB History    Gravida  4   Para  1   Term  1   Preterm      AB  3   Living  1     SAB      TAB      Ectopic  3   Multiple      Live Births               Home Medications    Prior to Admission medications   Medication Sig Start Date End Date Taking? Authorizing Provider  buprenorphine-naloxone (SUBOXONE) 8-2 mg SUBL SL tablet Place 0.5 tablets under the tongue daily.    [provider]  citalopram (CELEXA) 40 MG  tablet Take 40 mg by mouth at bedtime.    [provider]  ibuprofen (ADVIL,MOTRIN) 200 MG tablet Take 600 mg by mouth every 6 (six) hours as needed for headache, moderate pain or cramping.    [provider]  Multiple Vitamin (MULTIVITAMIN WITH MINERALS) TABS tablet Take 1 tablet by mouth daily.    [provider]  predniSONE (DELTASONE) 10 MG tablet Take 6 tablets day one, 5 tablets day two, 4 tablets day three, 3 tablets day four, 2 tablets day five, then 1 tablet day six 10/04/17   Idol, Raynelle FanningJulie, PA-C  traZODone (DESYREL) 100 MG tablet Take 1 tablet by mouth at bedtime as needed.    [provider]    Family History Family History  Problem Relation Age of Onset  . Hypertension Mother   . Cancer Maternal Grandmother        colon cancer  . Cancer Maternal Grandfather        colon cancer    Social History Social History   Tobacco Use  . Smoking status: Never Smoker  . Smokeless tobacco: Never Used  Substance Use Topics  . Alcohol use: No  Alcohol/week: 1.0 standard drinks    Types: 1 Glasses of wine per week  . Drug use: Yes    Types: Benzodiazepines, Marijuana    Comment: opiates- Former     Allergies   Augmentin [amoxicillin-pot clavulanate]   Review of Systems Review of Systems  Cardiovascular: Positive for chest pain.  All other systems reviewed and are negative.    Physical Exam Updated Vital Signs BP (!) 206/113 (BP Location: Right Arm)   Pulse 72   Temp 98.2 F (36.8 C) (Oral)   Resp 20   Ht 5' (1.524 m)   Wt 77.1 kg   LMP 02/16/2018   SpO2 100%   BMI 33.20 kg/m   Physical Exam Vitals signs and nursing note reviewed.  Constitutional:      General: She is not in acute distress.    Appearance: She is well-developed. She is not diaphoretic.  HENT:     Head: Normocephalic and atraumatic.  Neck:     Musculoskeletal: Normal range of motion and neck supple.  Cardiovascular:     Rate and Rhythm: Normal rate and  regular rhythm.     Heart sounds: No murmur. No friction rub. No gallop.   Pulmonary:     Effort: Pulmonary effort is normal. No respiratory distress.     Breath sounds: Normal breath sounds. No wheezing.     Comments: There is tenderness to palpation of the anterior chest wall.  This reproduces her symptoms. Abdominal:     General: Bowel sounds are normal. There is no distension.     Palpations: Abdomen is soft.     Tenderness: There is no abdominal tenderness.  Musculoskeletal: Normal range of motion.  Skin:    General: Skin is warm and dry.  Neurological:     Mental Status: She is alert and oriented to person, place, and time.      ED Treatments / Results  Labs (all labs ordered are listed, but only abnormal results are displayed) Labs Reviewed  COMPREHENSIVE METABOLIC PANEL  CBC WITH DIFFERENTIAL/PLATELET  LIPASE, BLOOD  I-STAT TROPONIN, ED  I-STAT BETA HCG BLOOD, ED ([facility], [facility], [facility] ONLY)    EKG EKG Interpretation  Date/Time:  Tuesday March 01 2018 02:39:39 EST Ventricular Rate:  67 PR Interval:    QRS Duration: 119 QT Interval:  412 QTC Calculation: 435 R Axis:   61 Text Interpretation:  Sinus rhythm Borderline short PR interval Nonspecific intraventricular conduction delay No significant change since 10/04/2017 Confirmed by Geoffery Lyons ([id number]) on 03/01/2018 3:06:29 AM   Radiology No results found.  Procedures Procedures (including critical care time)  Medications Ordered in ED Medications  ketorolac (TORADOL) 30 MG/ML injection 30 mg (has no administration in time range)     Initial Impression / Assessment and Plan / ED Course  I have reviewed the triage vital signs and the nursing notes.  Pertinent labs & imaging results that were available during my care of the patient were reviewed by me and considered in my medical decision making (see chart for details).  Patient presents here with complaints of chest pain.  The pain is sharp in nature and  reproducible with palpation of her chest wall.  There is nothing to suggest a cardiac etiology.  Her EKG is unchanged and troponin is negative.  Laboratory studies are otherwise unremarkable including lipase, LFTs, and white count.  Chest x-ray shows no acute process.  I highly suspect a musculoskeletal etiology and do not feel as though any further work-up is  indicated.  She is feeling better after Toradol and morphine and I believe discharge to be the appropriate disposition.  She will be discharged with anti-inflammatory medications and follow-up as needed.  I have considered pulmonary embolism, however there is no tachycardia and oxygen saturations are 100%.  She has no risk factors for PE.  Final Clinical Impressions(s) / ED Diagnoses   Final diagnoses:  None    ED Discharge Orders    None       Geoffery Lyons, MD 03/01/18 [phone]

## 2018-04-03 ENCOUNTER — Encounter (HOSPITAL_COMMUNITY): Payer: Self-pay | Admitting: Emergency Medicine

## 2018-04-03 ENCOUNTER — Other Ambulatory Visit: Payer: Self-pay

## 2018-04-03 ENCOUNTER — Emergency Department (HOSPITAL_COMMUNITY)
Admission: EM | Admit: 2018-04-03 | Discharge: 2018-04-03 | Disposition: A | Discharge status: Home / Self Care | Payer: Self-pay | Attending: Emergency Medicine | Admitting: Emergency Medicine

## 2018-04-03 ENCOUNTER — Emergency Department (HOSPITAL_COMMUNITY): Payer: Self-pay

## 2018-04-03 DIAGNOSIS — Z79891 Long term (current) use of opiate analgesic: Secondary | ICD-10-CM | POA: Insufficient documentation

## 2018-04-03 DIAGNOSIS — Z79899 Other long term (current) drug therapy: Secondary | ICD-10-CM | POA: Insufficient documentation

## 2018-04-03 DIAGNOSIS — R0789 Other chest pain: Secondary | ICD-10-CM | POA: Insufficient documentation

## 2018-04-03 DIAGNOSIS — R1012 Left upper quadrant pain: Secondary | ICD-10-CM | POA: Insufficient documentation

## 2018-04-03 LAB — CBC WITH DIFFERENTIAL/PLATELET
Abs Immature Granulocytes: 0.02 10*3/uL (ref 0.00–0.07)
Basophils Absolute: 0 10*3/uL (ref 0.0–0.1)
Basophils Relative: 1 %
EOS ABS: 0.1 10*3/uL (ref 0.0–0.5)
Eosinophils Relative: 2 %
HCT: 35.7 % — ABNORMAL LOW (ref 36.0–46.0)
Hemoglobin: 10 g/dL — ABNORMAL LOW (ref 12.0–15.0)
IMMATURE GRANULOCYTES: 0 %
Lymphocytes Relative: 29 %
Lymphs Abs: 1.7 10*3/uL (ref 0.7–4.0)
MCH: 20.7 pg — ABNORMAL LOW (ref 26.0–34.0)
MCHC: 28 g/dL — ABNORMAL LOW (ref 30.0–36.0)
MCV: 73.8 fL — ABNORMAL LOW (ref 80.0–100.0)
Monocytes Absolute: 0.3 10*3/uL (ref 0.1–1.0)
Monocytes Relative: 5 %
NEUTROS PCT: 63 %
Neutro Abs: 3.7 10*3/uL (ref 1.7–7.7)
Platelets: 385 10*3/uL (ref 150–400)
RBC: 4.84 MIL/uL (ref 3.87–5.11)
RDW: 19 % — ABNORMAL HIGH (ref 11.5–15.5)
WBC: 5.8 10*3/uL (ref 4.0–10.5)
nRBC: 0 % (ref 0.0–0.2)

## 2018-04-03 LAB — URINALYSIS, ROUTINE W REFLEX MICROSCOPIC
Bilirubin Urine: NEGATIVE
Glucose, UA: NEGATIVE mg/dL
Hgb urine dipstick: NEGATIVE
Ketones, ur: NEGATIVE mg/dL
Nitrite: NEGATIVE
Protein, ur: NEGATIVE mg/dL
Specific Gravity, Urine: 1.015 (ref 1.005–1.030)
pH: 6 (ref 5.0–8.0)

## 2018-04-03 LAB — COMPREHENSIVE METABOLIC PANEL
ALT: 12 U/L (ref 0–44)
AST: 22 U/L (ref 15–41)
Albumin: 4.1 g/dL (ref 3.5–5.0)
Alkaline Phosphatase: 48 U/L (ref 38–126)
Anion gap: 9 (ref 5–15)
BILIRUBIN TOTAL: 0.3 mg/dL (ref 0.3–1.2)
BUN: 9 mg/dL (ref 6–20)
CO2: 21 mmol/L — ABNORMAL LOW (ref 22–32)
Calcium: 9.9 mg/dL (ref 8.9–10.3)
Chloride: 109 mmol/L (ref 98–111)
Creatinine, Ser: 0.71 mg/dL (ref 0.44–1.00)
GFR calc non Af Amer: >60 mL/min (ref >60)
Glucose, Bld: 112 mg/dL — ABNORMAL HIGH (ref 70–99)
Potassium: 4 mmol/L (ref 3.5–5.1)
Sodium: 139 mmol/L (ref 135–145)
Total Protein: 7.8 g/dL (ref 6.5–8.1)

## 2018-04-03 LAB — URINALYSIS, MICROSCOPIC (REFLEX)

## 2018-04-03 LAB — TROPONIN I: Troponin I: <0.03 ng/mL (ref <0.03)

## 2018-04-03 LAB — POC URINE PREG, ED: Preg Test, Ur: NEGATIVE

## 2018-04-03 LAB — LIPASE, BLOOD: LIPASE: 41 U/L (ref 11–51)

## 2018-04-03 LAB — POC OCCULT BLOOD, ED: Fecal Occult Bld: NEGATIVE

## 2018-04-03 MED ORDER — MAGNESIUM HYDROXIDE 400 MG/5ML PO SUSP
30.0000 mL | Freq: Once | ORAL | Status: DC
  Filled 2018-04-03 (×2): qty 30

## 2018-04-03 MED ORDER — PROCHLORPERAZINE MALEATE 5 MG PO TABS
5.0000 mg | ORAL_TABLET | Freq: Once | ORAL | Status: AC
  Administered 2018-04-03: 5 mg via ORAL
  Filled 2018-04-03: qty 1

## 2018-04-03 MED ORDER — METHOCARBAMOL 1000 MG/10ML IJ SOLN
1000.0000 mg | Freq: Once | INTRAVENOUS | Status: AC
  Administered 2018-04-03: 1000 mg via INTRAVENOUS
  Filled 2018-04-03: qty 10

## 2018-04-03 MED ORDER — ACETAMINOPHEN 500 MG PO TABS
1000.0000 mg | ORAL_TABLET | Freq: Once | ORAL | Status: AC
  Administered 2018-04-03: 1000 mg via ORAL
  Filled 2018-04-03: qty 2

## 2018-04-03 MED ORDER — IBUPROFEN 400 MG PO TABS
400.0000 mg | ORAL_TABLET | Freq: Once | ORAL | Status: AC
  Administered 2018-04-03: 400 mg via ORAL
  Filled 2018-04-03: qty 1

## 2018-04-03 MED ORDER — CYCLOBENZAPRINE HCL 10 MG PO TABS: 10.0000 mg | ORAL_TABLET | Freq: Three times a day (TID) | ORAL | 0 refills | Status: DC

## 2018-04-03 NOTE — ED Provider Notes (Signed)
[facility] EMERGENCY DEPARTMENT Provider Note   CSN: [id number] Arrival date & time: 04/03/18  5993     History   Chief Complaint Chief Complaint  Patient presents with  . Chest Pain    HPI [name] is a 38 y.o. female.  Patient is a 38 year old female who presents to the emergency department with a complaint of left chest pain.  The patient states that this problem is been going on over the last 3 days.  3 days ago she was awakened in the early morning hours with pain in the left chest.  She says she was gradually able to go back to sleep and it went away pretty much on its own.  She did not seek any medical advice or evaluation at that time.  This morning, February 9 at approximately 2AM she was awakened from sleep with a pain that she describes as sharp continuous pain in the left chest, radiating to the left back.  There was some mild nausea present, but no actual vomiting.  The patient denies breaking out in any sweats.  There is no loss of consciousness reported.  There is been no changes in the patient's stool.  The patient's last bowel movement was 2 days ago.  The patient states this is not unusual for her to skip a day or 2 with her bowels.  No recent changes in diet or exercise or activity.  She has not noted any blood in her stools lately and no blood in her urine.  There is been no recent operations or procedures involving the chest or abdomen.  The patient has not been on any excess excessively long trips, or had long periods of inactivity.  At 2 AM the pain was an 8 on a scale of 1-10.  At this time the skate pain is a 7 on a scale of 1-10.  The patient is not taken any medication for this pain.  The patient does state that she had a similar episode in January of this year.  She says that she was diagnosed at that time with a muscle type pain.  She presents at this time requesting assistance with this issue.  The history is provided by the patient.    Past Medical  History:  Diagnosis Date  . Abnormal pap   . Anxiety   . Depression     Patient Active Problem List   Diagnosis Date Noted  . 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 Gastroesophageal reflux disease without esophagitis 03/17/2016  . Unspecified gastritis and gastroduodenitis without mention of hemorrhage 10/02/2013  . Depression with anxiety 06/22/2013    Past Surgical History:  Procedure Laterality Date  . CESAREAN SECTION    . left fallopian tube removal    . right tube removed     history of ectopic pregnancies     OB History    Gravida  4   Para  1   Term  1   Preterm      AB  3   Living  1     SAB      TAB      Ectopic  3   Multiple      Live Births               Home Medications    Prior to Admission medications   Medication Sig Start Date End Date Taking? Authorizing Provider  buprenorphine-naloxone (SUBOXONE) 8-2 mg SUBL SL tablet Place 0.5 tablets under the  tongue daily.    [provider]  citalopram (CELEXA) 40 MG tablet Take 40 mg by mouth at bedtime.    [provider]  ibuprofen (ADVIL,MOTRIN) 200 MG tablet Take 600 mg by mouth every 6 (six) hours as needed for headache, moderate pain or cramping.    [provider]  Multiple Vitamin (MULTIVITAMIN WITH MINERALS) TABS tablet Take 1 tablet by mouth daily.    [provider]  predniSONE (DELTASONE) 10 MG tablet Take 6 tablets day one, 5 tablets day two, 4 tablets day three, 3 tablets day four, 2 tablets day five, then 1 tablet day six 10/04/17   Idol, Raynelle FanningJulie, PA-C  traZODone (DESYREL) 100 MG tablet Take 1 tablet by mouth at bedtime as needed.    [provider]    Family History Family History  Problem Relation Age of Onset  . Hypertension Mother   . Cancer Maternal Grandmother        colon cancer  . Cancer Maternal Grandfather        colon cancer    Social History Social History   Tobacco Use  . Smoking status: Never Smoker  .  Smokeless tobacco: Never Used  Substance Use Topics  . Alcohol use: No    Alcohol/week: 1.0 standard drinks    Types: 1 Glasses of wine per week  . Drug use: Not Currently    Types: Benzodiazepines, Marijuana    Comment: opiates- Former     Allergies   Augmentin [amoxicillin-pot clavulanate]   Review of Systems Review of Systems  Constitutional: Negative for activity change.       All ROS Neg except as noted in HPI  HENT: Negative for nosebleeds.   Eyes: Negative for photophobia and discharge.  Respiratory: Negative for cough, shortness of breath and wheezing.   Cardiovascular: Positive for chest pain. Negative for palpitations and leg swelling.  Gastrointestinal: Positive for abdominal pain and constipation. Negative for blood in stool.  Genitourinary: Negative for dysuria, frequency and hematuria.  Musculoskeletal: Negative for arthralgias, back pain and neck pain.  Skin: Negative.   Neurological: Negative for dizziness, seizures and speech difficulty.  Psychiatric/Behavioral: Negative for confusion and hallucinations.     Physical Exam Updated Vital Signs BP (!) 163/96 (BP Location: Right Arm)   Pulse 81   Temp 98.1 F (36.7 C) (Oral)   Resp 12   Ht 5' (1.524 m)   Wt 74.8 kg   LMP 03/14/2018   SpO2 100%   BMI 32.22 kg/m   Physical Exam Vitals signs and nursing note reviewed.  Constitutional:      General: She is not in acute distress.    Appearance: She is well-developed.  HENT:     Head: Normocephalic and atraumatic.     Right Ear: External ear normal.     Left Ear: External ear normal.  Eyes:     General: No scleral icterus.       Right eye: No discharge.        Left eye: No discharge.     Conjunctiva/sclera: Conjunctivae normal.  Neck:     Musculoskeletal: Neck supple.     Trachea: No tracheal deviation.  Cardiovascular:     Rate and Rhythm: Normal rate and regular rhythm.     Heart sounds: No murmur. No friction rub.  Pulmonary:     Effort:  Pulmonary effort is normal. No respiratory distress.     Breath sounds: Normal breath sounds. No stridor. No wheezing or rales.  Abdominal:     General: Bowel sounds are normal. There is no distension.     Palpations: Abdomen is soft.     Tenderness: There is no abdominal tenderness. There is no guarding or rebound.     Comments: Mild left upper quadrant soreness to palpation.  No pain in this area with change of position.  Bowel sounds are present and active.  No splenomegaly or organomegaly appreciated.  No CVA tenderness.  Musculoskeletal:        General: No tenderness.  Skin:    General: Skin is warm and dry.     Findings: No rash.  Neurological:     Mental Status: She is alert.     Cranial Nerves: No cranial nerve deficit (no facial droop, extraocular movements intact, no slurred speech).     Sensory: No sensory deficit.     Motor: No abnormal muscle tone or seizure activity.     Coordination: Coordination normal.      ED Treatments / Results  Labs (all labs ordered are listed, but only abnormal results are displayed) Labs Reviewed - No data to display  EKG EKG Interpretation  Date/Time:  Sunday April 03 2018 07:37:13 EST Ventricular Rate:  72 PR Interval:    QRS Duration: 115 QT Interval:  391 QTC Calculation: 428 R Axis:   26 Text Interpretation:  Sinus rhythm Borderline short PR interval Incomplete right bundle branch block Confirmed by Kohut, Stephen ([id number]) on 04/03/2018 7:50:21 AM   Radiology No results found.  Procedures Procedures (including critical care time)  Medications Ordered in ED Medications - No data to display   Initial Impression / Assessment and Plan / ED Course  I have reviewed the triage vital signs and the nursing notes.  Pertinent labs & imaging results that were available during my care of the patient were reviewed by me and considered in my medical decision making (see chart for details).       Final Clinical Impressions(s) /  ED Diagnoses MDM Blood pressure is elevated.  I have asked the patient to have this rechecked soon.  The remainder the vital signs were within normal limits.  Pulse oximetry is 100% on room air.  Within normal limits by my interpretation.  Stool for occult blood is negative.  Urine pregnancy test is negative.  Urine analysis reveals moderate leukocyte esterase with 6-10 white blood cells.  A culture has been sent to the lab. . Troponin is negative for acute event, comprehensive metabolic panel is also negative. Lipase is normal.  Complete blood count is nonacute.  X-ray is negative for acute cardiopulmonary disease.  Patient continues to complain of pain.  Patient treated with IV Robaxin.  Recheck.  States she got only minimal relief.  I have discussed the findings on the examination as well as the findings on the labs and x-rays with the patient in terms of which he understands.  I have asked the patient to see her primary physician to complete the work-up.  I reassured her that no acute or emergent findings were noted at this time.   Final diagnoses:  Musculoskeletal chest pain    ED Discharge Orders         Ordered    cyclobenzaprine (FLEXERIL) 10 MG tablet  3 times daily     02 /09/20 1205           Ivery QualeBryant, Arzu Mcgaughey, PA-C 04/04/18 2247    Raeford RazorKohut, Stephen, MD 04/06/18 1238

## 2018-04-03 NOTE — ED Triage Notes (Signed)
Pain under lt breast intermittent for past few days

## 2018-04-03 NOTE — Discharge Instructions (Addendum)
Your chest xray is negative for acute problem. Your vital signs are within normal limits. YOur EKG and heart enzymes are negative. Your electrolytes are within normal limits. And your stool is negative for occult blood at this time. Please see your primary MD for additional evaluation of your left chest discomfort. Use flexeril three times daily for possible muscle related pain. Return to the Emergency Dept if any changes in your condition.

## 2018-04-04 LAB — URINE CULTURE
Culture: NO GROWTH
Special Requests: NORMAL

## 2019-10-12 ENCOUNTER — Inpatient Hospital Stay
Admission: RE | Admit: 2019-10-12 | Discharge: 2019-10-12 | Disposition: A | Discharge status: Home / Self Care | Payer: Self-pay | Source: Ambulatory Visit

## 2019-10-18 ENCOUNTER — Other Ambulatory Visit (HOSPITAL_COMMUNITY)
Admission: RE | Admit: 2019-10-18 | Discharge: 2019-10-18 | Disposition: A | Discharge status: Home / Self Care | Payer: BC Managed Care – PPO | Source: Ambulatory Visit | Attending: Adult Health | Admitting: Adult Health

## 2019-10-18 ENCOUNTER — Ambulatory Visit (INDEPENDENT_AMBULATORY_CARE_PROVIDER_SITE_OTHER): Payer: BC Managed Care – PPO | Admitting: Adult Health

## 2019-10-18 ENCOUNTER — Encounter: Payer: Self-pay | Admitting: Adult Health

## 2019-10-18 VITALS — BP 152/89 | HR 99 | Ht 60.0 in | Wt 182.4 lb

## 2019-10-18 DIAGNOSIS — Z01419 Encounter for gynecological examination (general) (routine) without abnormal findings: Secondary | ICD-10-CM | POA: Diagnosis not present

## 2019-10-18 DIAGNOSIS — Z8742 Personal history of other diseases of the female genital tract: Secondary | ICD-10-CM

## 2019-10-18 DIAGNOSIS — N941 Unspecified dyspareunia: Secondary | ICD-10-CM | POA: Diagnosis not present

## 2019-10-18 DIAGNOSIS — R5383 Other fatigue: Secondary | ICD-10-CM | POA: Diagnosis not present

## 2019-10-18 DIAGNOSIS — R519 Headache, unspecified: Secondary | ICD-10-CM | POA: Diagnosis not present

## 2019-10-18 DIAGNOSIS — R030 Elevated blood-pressure reading, without diagnosis of hypertension: Secondary | ICD-10-CM | POA: Insufficient documentation

## 2019-10-18 MED ORDER — HYDROCHLOROTHIAZIDE 12.5 MG PO CAPS: 12.5000 mg | ORAL_CAPSULE | Freq: Every day | ORAL | 6 refills | Status: DC

## 2019-10-18 NOTE — Progress Notes (Signed)
Patient ID: [name], female   DOB: 1980/07/04, 39 y.o.   MRN: [id number] History of Present Illness: [name] is a 39 year old white female, married, [id number] in for a well woman gyn exam and pap. 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  She had ASCUS +HPV 10/07/11 and may have had normal since then. No PCP   Current Medications, Allergies, Past Medical History, Past Surgical History, Family History and Social History were reviewed in [employer] record.     Review of Systems: Patient denies any hearing loss,  blurred vision, shortness of breath, chest pain, abdominal pain, problems with bowel movements, urination(was treated recently for UTI). No joint pain or mood swings. Has pain with sex, feels dry She she is tired, and has frequent headaches and migraines with her period.  Physical Exam:BP (!) 152/89 (BP Location: Right Arm, Patient Position: Sitting, Cuff Size: Normal)   Pulse 99   Ht 5' (1.524 m)   Wt 182 lb 6.4 oz (82.7 kg)   LMP 09/30/2019 (Exact Date)   BMI 35.62 kg/m  General:  Well developed, well nourished, no acute distress Skin:  Warm and dry Neck:  Midline trachea, normal thyroid, good ROM, no lymphadenopathy Lungs; Clear to auscultation bilaterally Breast:  No dominant palpable mass, retraction, or nipple discharge,has regular irregularities  Cardiovascular: Regular rate and rhythm Abdomen:  Soft, non tender, no hepatosplenomegaly Pelvic:  External genitalia is normal in appearance, no lesions.  The vagina is normal in appearance. Urethra has no lesions or masses. The cervix is bulbous. Pap with GC/CHL and high risk HPV genotyping performed. Uterus is felt to be normal size, shape, and contour.  No adnexal masses or tenderness noted.Bladder is non tender, no masses felt. Extremities/musculoskeletal:  No swelling or varicosities noted, no clubbing or cyanosis Psych:  No mood changes, alert and  cooperative,seems happy AA is 1 Fall risk is low PHQ 9 score is 1   Upstream - 10/18/19 1544      Pregnancy Intention Screening   Does the patient want to become pregnant in the next year? No    Does the patient's partner want to become pregnant in the next year? No    Would the patient like to discuss contraceptive options today? No      Contraception Wrap Up   Current Method Female Sterilization    End Method Female Sterilization    Contraception Counseling Provided No         Examination chaperoned by Federico Flake CMA.   Impression and Plan: 1. Encounter for gynecological examination with Papanicolaou smear of cervix Pap sent Physical in 1 year Pap in 3 if normal Get mammogram at 39 Check CBC,CMP,TSH and lipids  2. Dyspareunia, female Try astroglide with sex  3. Tired Check CBC and TSH   4. Frequent headaches   5. Elevated BP without diagnosis of hypertension Will Rx Microzide  Meds ordered this encounter  Medications  . hydrochlorothiazide (MICROZIDE) 12.5 MG capsule    Sig: Take 1 capsule (12.5 mg total) by mouth daily.    Dispense:  30 capsule    Refill:  6    Order Specific Question:   Supervising Provider    Answer:   Despina Hidden, LUTHER H [[id number]]  Recheck BP in 8 weeks   6. History of abnormal cervical Pap smear Pap sent

## 2019-10-19 LAB — CBC
Hematocrit: 31.1 % — ABNORMAL LOW (ref 34.0–46.6)
Hemoglobin: 9.3 g/dL — ABNORMAL LOW (ref 11.1–15.9)
MCH: 20.9 pg — ABNORMAL LOW (ref 26.6–33.0)
MCHC: 29.9 g/dL — ABNORMAL LOW (ref 31.5–35.7)
MCV: 70 fL — ABNORMAL LOW (ref 79–97)
Platelets: 418 10*3/uL (ref 150–450)
RBC: 4.46 x10E6/uL (ref 3.77–5.28)
RDW: 17.6 % — ABNORMAL HIGH (ref 11.7–15.4)
WBC: 8 10*3/uL (ref 3.4–10.8)

## 2019-10-19 LAB — COMPREHENSIVE METABOLIC PANEL
ALT: 11 IU/L (ref 0–32)
AST: 25 IU/L (ref 0–40)
Albumin/Globulin Ratio: 1.6 (ref 1.2–2.2)
Albumin: 4.4 g/dL (ref 3.8–4.8)
Alkaline Phosphatase: 64 IU/L (ref 48–121)
BUN/Creatinine Ratio: 16 (ref 9–23)
BUN: 13 mg/dL (ref 6–20)
Bilirubin Total: <0.2 mg/dL (ref 0.0–1.2)
CO2: 22 mmol/L (ref 20–29)
Calcium: 9.2 mg/dL (ref 8.7–10.2)
Chloride: 100 mmol/L (ref 96–106)
Creatinine, Ser: 0.8 mg/dL (ref 0.57–1.00)
GFR calc Af Amer: 107 mL/min/{1.73_m2} (ref >59)
GFR calc non Af Amer: 93 mL/min/{1.73_m2} (ref >59)
Globulin, Total: 2.7 g/dL (ref 1.5–4.5)
Glucose: 99 mg/dL (ref 65–99)
Potassium: 4.3 mmol/L (ref 3.5–5.2)
Sodium: 137 mmol/L (ref 134–144)
Total Protein: 7.1 g/dL (ref 6.0–8.5)

## 2019-10-19 LAB — LIPID PANEL
Chol/HDL Ratio: 5.9 ratio — ABNORMAL HIGH (ref 0.0–4.4)
Cholesterol, Total: 189 mg/dL (ref 100–199)
HDL: 32 mg/dL — ABNORMAL LOW (ref >39)
LDL Chol Calc (NIH): 70 mg/dL (ref 0–99)
Triglycerides: 561 mg/dL (ref 0–149)
VLDL Cholesterol Cal: 87 mg/dL — ABNORMAL HIGH (ref 5–40)

## 2019-10-19 LAB — TSH: TSH: 1.6 u[IU]/mL (ref 0.450–4.500)

## 2019-10-20 ENCOUNTER — Encounter: Payer: Self-pay | Admitting: Adult Health

## 2019-10-20 DIAGNOSIS — D649 Anemia, unspecified: Secondary | ICD-10-CM | POA: Insufficient documentation

## 2019-10-20 DIAGNOSIS — E782 Mixed hyperlipidemia: Secondary | ICD-10-CM | POA: Insufficient documentation

## 2019-10-20 HISTORY — DX: Mixed hyperlipidemia: E78.2

## 2019-10-20 HISTORY — DX: Anemia, unspecified: D64.9

## 2019-10-23 LAB — CYTOLOGY - PAP
Chlamydia: NEGATIVE
Comment: NEGATIVE
Comment: NEGATIVE
Comment: NORMAL
Diagnosis: NEGATIVE
High risk HPV: NEGATIVE
Neisseria Gonorrhea: NEGATIVE

## 2019-10-27 ENCOUNTER — Other Ambulatory Visit: Payer: Self-pay | Admitting: Adult Health

## 2019-10-27 MED ORDER — SULFAMETHOXAZOLE-TRIMETHOPRIM 800-160 MG PO TABS: 1.0000 | ORAL_TABLET | Freq: Two times a day (BID) | ORAL | 0 refills | Status: DC

## 2019-10-27 NOTE — Progress Notes (Unsigned)
Will rx septra ds  

## 2019-11-13 ENCOUNTER — Other Ambulatory Visit (INDEPENDENT_AMBULATORY_CARE_PROVIDER_SITE_OTHER): Payer: BC Managed Care – PPO | Admitting: *Deleted

## 2019-11-13 DIAGNOSIS — R300 Dysuria: Secondary | ICD-10-CM

## 2019-11-13 MED ORDER — NITROFURANTOIN MONOHYD MACRO 100 MG PO CAPS: 100.0000 mg | ORAL_CAPSULE | Freq: Two times a day (BID) | ORAL | 0 refills | Status: DC

## 2019-11-13 NOTE — Addendum Note (Signed)
Addended by: [name] A on: 11/13/2019 01:11 PM   Modules accepted: Orders

## 2019-11-13 NOTE — Progress Notes (Signed)
Chart reviewed for nurse visit. Agree with plan of care. Will rx macrobid  [name] A, NP 11/13/2019 1:10 PM

## 2019-11-13 NOTE — Progress Notes (Signed)
   NURSE VISIT- UTI SYMPTOMS   SUBJECTIVE:  [name] is a 39 y.o. [phone] female here for UTI symptoms. She is a GYN patient. She reports chills and urinary urgency.  OBJECTIVE:  There were no vitals taken for this visit.  Appears well, in no apparent distress  No results found for this or any previous visit (from the past 24 hour(s)).  ASSESSMENT: GYN patient with UTI symptoms and unable to detect nitrites due to taking AZO  PLAN: Discussed with [name], AGNP   Rx sent by provider today: Yes Urine culture sent Call or return to clinic prn if these symptoms worsen or fail to improve as anticipated. Follow-up: as needed   [name]  11/13/2019 11:58 AM

## 2019-11-16 ENCOUNTER — Encounter: Payer: Self-pay | Admitting: *Deleted

## 2019-11-16 LAB — URINE CULTURE

## 2019-11-27 ENCOUNTER — Other Ambulatory Visit (INDEPENDENT_AMBULATORY_CARE_PROVIDER_SITE_OTHER): Payer: BC Managed Care – PPO

## 2019-11-27 VITALS — BP 147/83 | HR 8 | Ht 60.0 in | Wt 180.0 lb

## 2019-11-27 DIAGNOSIS — R319 Hematuria, unspecified: Secondary | ICD-10-CM | POA: Diagnosis not present

## 2019-11-27 LAB — POCT URINALYSIS DIPSTICK
Glucose, UA: NEGATIVE
Ketones, UA: NEGATIVE
Leukocytes, UA: NEGATIVE
Nitrite, UA: NEGATIVE
Protein, UA: NEGATIVE

## 2019-11-27 NOTE — Progress Notes (Signed)
Chart reviewed for nurse visit. Agree with plan of care. UA C&S sent, if negative, could be IC and will refer to urologist, after trying Barbaraann Boys, NP 11/27/2019 12:15 PM

## 2019-11-27 NOTE — Progress Notes (Signed)
   NURSE VISIT- UTI SYMPTOMS   SUBJECTIVE:  [name] is a 39 y.o. [phone] female here for UTI symptoms. She is a GYN patient. She reports urinary frequencyand burning with urination.   OBJECTIVE:  BP (!) 147/83 (BP Location: Left Arm, Patient Position: Sitting, Cuff Size: Normal)   Pulse (!) 8   Ht 5' (1.524 m)   Wt 180 lb (81.6 kg)   BMI 35.15 kg/m   Appears well, in no apparent distress  Results for orders placed or performed in visit on 11/27/19 (from the past 24 hour(s))  POCT Urinalysis Dipstick   Collection Time: 11/27/19 12:08 PM  Result Value Ref Range   Color, UA     Clarity, UA     Glucose, UA Negative Negative   Bilirubin, UA     Ketones, UA n    Spec Grav, UA     Blood, UA trace    pH, UA     Protein, UA Negative Negative   Urobilinogen, UA     Nitrite, UA n    Leukocytes, UA Negative Negative   Appearance     Odor      ASSESSMENT: GYN patient with UTI symptoms and negative nitrites  PLAN: Discussed with [name], AGNP   Rx sent by provider today: No Urine culture sent Call or return to clinic prn if these symptoms worsen or fail to improve as anticipated. Follow-up: as needed   Nance Pear  11/27/2019 12:12 PM

## 2019-11-27 NOTE — Telephone Encounter (Signed)
Stop my the office and lets check a urine

## 2019-11-28 ENCOUNTER — Other Ambulatory Visit: Payer: Self-pay | Admitting: Adult Health

## 2019-11-28 ENCOUNTER — Encounter: Payer: Self-pay | Admitting: Adult Health

## 2019-11-28 ENCOUNTER — Telehealth: Payer: Self-pay | Admitting: Adult Health

## 2019-11-28 DIAGNOSIS — N390 Urinary tract infection, site not specified: Secondary | ICD-10-CM

## 2019-11-28 LAB — URINALYSIS, ROUTINE W REFLEX MICROSCOPIC
Bilirubin, UA: NEGATIVE
Glucose, UA: NEGATIVE
Ketones, UA: NEGATIVE
Nitrite, UA: NEGATIVE
RBC, UA: NEGATIVE
Specific Gravity, UA: 1.025 (ref 1.005–1.030)
Urobilinogen, Ur: 0.2 mg/dL (ref 0.2–1.0)
pH, UA: 5.5 (ref 5.0–7.5)

## 2019-11-28 LAB — MICROSCOPIC EXAMINATION
Casts: NONE SEEN /lpf
WBC, UA: >30 /hpf — AB (ref 0–5)

## 2019-11-28 MED ORDER — CIPROFLOXACIN HCL 500 MG PO TABS: 500.0000 mg | ORAL_TABLET | Freq: Two times a day (BID) | ORAL | 0 refills | Status: DC

## 2019-11-28 NOTE — Telephone Encounter (Signed)
Pt aware that I sent in rx for cipro and did a urology referral

## 2019-11-28 NOTE — Progress Notes (Signed)
Referred to urology

## 2019-11-28 NOTE — Telephone Encounter (Signed)
Left message that I have done her letter for [facility]

## 2019-11-28 NOTE — Progress Notes (Signed)
rx cipro 

## 2019-11-30 ENCOUNTER — Encounter: Payer: Self-pay | Admitting: Adult Health

## 2019-11-30 NOTE — Telephone Encounter (Signed)
Opened in Error.

## 2019-12-01 LAB — URINE CULTURE

## 2019-12-13 ENCOUNTER — Ambulatory Visit: Payer: BC Managed Care – PPO | Admitting: Adult Health

## 2019-12-20 ENCOUNTER — Ambulatory Visit: Payer: BC Managed Care – PPO | Admitting: Adult Health

## 2020-01-09 ENCOUNTER — Other Ambulatory Visit: Payer: Self-pay

## 2020-01-09 ENCOUNTER — Encounter: Payer: Self-pay | Admitting: Urology

## 2020-01-09 ENCOUNTER — Ambulatory Visit (INDEPENDENT_AMBULATORY_CARE_PROVIDER_SITE_OTHER): Payer: BC Managed Care – PPO | Admitting: Urology

## 2020-01-09 VITALS — BP 161/87 | HR 83 | Temp 98.9°F | Ht 60.0 in | Wt 180.0 lb

## 2020-01-09 DIAGNOSIS — N390 Urinary tract infection, site not specified: Secondary | ICD-10-CM | POA: Diagnosis not present

## 2020-01-09 LAB — MICROSCOPIC EXAMINATION
Epithelial Cells (non renal): >10 /hpf — AB (ref 0–10)
RBC, Urine: >30 /hpf — AB (ref 0–2)
Renal Epithel, UA: NONE SEEN /hpf

## 2020-01-09 LAB — URINALYSIS, ROUTINE W REFLEX MICROSCOPIC
Bilirubin, UA: NEGATIVE
Glucose, UA: NEGATIVE
Leukocytes,UA: NEGATIVE
Nitrite, UA: NEGATIVE
Specific Gravity, UA: >1.03 — ABNORMAL HIGH (ref 1.005–1.030)
Urobilinogen, Ur: 0.2 mg/dL (ref 0.2–1.0)
pH, UA: 6 (ref 5.0–7.5)

## 2020-01-09 MED ORDER — NITROFURANTOIN MONOHYD MACRO 100 MG PO CAPS: 100.0000 mg | ORAL_CAPSULE | Freq: Two times a day (BID) | ORAL | 1 refills | Status: DC

## 2020-01-09 NOTE — Progress Notes (Signed)
H&P  Chief Complaint: Recurrent UTIs  History of Present Illness: [name] is a 39 y.o. year old female new patient referred for recurrent UTIs.  11.16.2021: In August 2021 pt was seen via telehealth for UTI symptoms - she has since had 3 additional UTIs, last 2 S. Aureus.  Pt UTI symptoms included dysuria, pressure, pain, urinary difficulty, and her final UTI was febrile (102F). She denies any associated pain in her back. She notes that with abx, her symptoms usually resolved quickly, but her fever with her last UTI lasted approximately 3-4 days after starting abx. Her first two UTIs were treated with Bactrim, her second with Keflex, and her third/ fourth  with Ciprofloxacin.  Pt reports that she typically has a strong FOS and feels she is able to empty her bladder completely. She was experiencing dyspareunia prior to her initial OB/Gyn visit. She does experience occasional constipation.  At this time she feels pressure in her bladder.  No juvenile UTI's.  Past Medical History:  Diagnosis Date  . Abnormal pap   . Anemia 10/20/2019   Add iron  . Anxiety   . Depression   . Elevated triglycerides with high cholesterol 10/20/2019   Try lifestyle modifications and consider meds too  . Vaginal Pap smear, abnormal     Past Surgical History:  Procedure Laterality Date  . CESAREAN SECTION    . left fallopian tube removal    . right tube removed     history of ectopic pregnancies    Home Medications:  (Not in a hospital admission)   Allergies:  Allergies  Allergen Reactions  . Augmentin [Amoxicillin-Pot Clavulanate] Other (See Comments)    Abdominal Pain    Family History  Problem Relation Age of Onset  . Hypertension Mother   . Cancer Maternal Grandmother        colon cancer  . Cancer Maternal Grandfather        colon cancer    Social History:  reports that she has never smoked. She has never used smokeless tobacco. She reports previous drug use. Drugs:  Benzodiazepines and Marijuana. She reports that she does not drink alcohol.  ROS: A complete review of systems was performed.  All systems are negative except for pertinent findings as noted.  Physical Exam:  Vital signs in last 24 hours: BP: ()/()  Arterial Line BP: ()/()  General:  Alert and oriented, No acute distress HEENT: Normocephalic, atraumatic Neck: No JVD or lymphadenopathy Cardiovascular: Regular rate  Lungs: Normal inspiratory/expiratory excursion Extremities: No edema Neurologic: Grossly intact  I have reviewed prior pt notes  I have reviewed notes from referring/previous physicians  I have reviewed urinalysis results  I have reviewed prior urine culture  Impression/Assessment:  1. Recurrent UTIs (relatively uncomplicated) - Pt has had a 4 UTIs relatively close together and her symptoms present as moderately concerning and she will be followed with a renal US for further investigation.  Plan:  1. Pt urine sent for culture  2. Pt will f/u with renal ultrasound  3. F/U pending results.  4. Started on McBid, take probiotics  CC: [name], NP  Kenney Houseman 01/09/2020, 12:39 PM  Bertram Millard. Nhu Glasby MD

## 2020-01-09 NOTE — Progress Notes (Signed)
Urological Symptom Review  Patient is experiencing the following symptoms: Frequent urination Hard to postpone urination Burning/pain with urination Get up at night to urinate Trouble starting stream Urinary tract infection Painful intercourse   Review of Systems  Gastrointestinal (upper)  : Negative for upper GI symptoms  Gastrointestinal (lower) : Negative for lower GI symptoms  Constitutional : Negative for symptoms  Skin: Negative for skin symptoms  Eyes: Negative for eye symptoms  Ear/Nose/Throat : Negative for Ear/Nose/Throat symptoms  Hematologic/Lymphatic: Negative for Hematologic/Lymphatic symptoms  Cardiovascular : Negative for cardiovascular symptoms  Respiratory : Negative for respiratory symptoms  Endocrine: Negative for endocrine symptoms  Musculoskeletal: Negative for musculoskeletal symptoms  Neurological: Headaches  Psychologic: Depression Anxiety

## 2020-01-10 ENCOUNTER — Inpatient Hospital Stay (HOSPITAL_COMMUNITY)
Admission: EM | Admit: 2020-01-10 | Discharge: 2020-01-11 | DRG: 661 | Disposition: A | Discharge status: Home / Self Care | Payer: BC Managed Care – PPO | Attending: Internal Medicine | Admitting: Internal Medicine

## 2020-01-10 ENCOUNTER — Telehealth: Payer: Self-pay | Admitting: *Deleted

## 2020-01-10 ENCOUNTER — Emergency Department (HOSPITAL_COMMUNITY): Payer: BC Managed Care – PPO

## 2020-01-10 ENCOUNTER — Encounter (HOSPITAL_COMMUNITY): Payer: Self-pay | Admitting: *Deleted

## 2020-01-10 ENCOUNTER — Other Ambulatory Visit: Payer: Self-pay

## 2020-01-10 DIAGNOSIS — K8020 Calculus of gallbladder without cholecystitis without obstruction: Secondary | ICD-10-CM | POA: Diagnosis present

## 2020-01-10 DIAGNOSIS — Z20822 Contact with and (suspected) exposure to covid-19: Secondary | ICD-10-CM | POA: Diagnosis present

## 2020-01-10 DIAGNOSIS — E669 Obesity, unspecified: Secondary | ICD-10-CM | POA: Diagnosis present

## 2020-01-10 DIAGNOSIS — N1330 Unspecified hydronephrosis: Secondary | ICD-10-CM | POA: Diagnosis not present

## 2020-01-10 DIAGNOSIS — Z880 Allergy status to penicillin: Secondary | ICD-10-CM

## 2020-01-10 DIAGNOSIS — Z800 Family history of malignant neoplasm of digestive organs: Secondary | ICD-10-CM

## 2020-01-10 DIAGNOSIS — Z87442 Personal history of urinary calculi: Secondary | ICD-10-CM

## 2020-01-10 DIAGNOSIS — Z8249 Family history of ischemic heart disease and other diseases of the circulatory system: Secondary | ICD-10-CM | POA: Diagnosis not present

## 2020-01-10 DIAGNOSIS — K219 Gastro-esophageal reflux disease without esophagitis: Secondary | ICD-10-CM | POA: Diagnosis present

## 2020-01-10 DIAGNOSIS — I10 Essential (primary) hypertension: Secondary | ICD-10-CM | POA: Diagnosis present

## 2020-01-10 DIAGNOSIS — N201 Calculus of ureter: Secondary | ICD-10-CM

## 2020-01-10 DIAGNOSIS — N136 Pyonephrosis: Principal | ICD-10-CM | POA: Diagnosis present

## 2020-01-10 DIAGNOSIS — D509 Iron deficiency anemia, unspecified: Secondary | ICD-10-CM | POA: Diagnosis present

## 2020-01-10 DIAGNOSIS — Z87440 Personal history of urinary (tract) infections: Secondary | ICD-10-CM

## 2020-01-10 DIAGNOSIS — Z79899 Other long term (current) drug therapy: Secondary | ICD-10-CM | POA: Diagnosis not present

## 2020-01-10 DIAGNOSIS — N3001 Acute cystitis with hematuria: Secondary | ICD-10-CM

## 2020-01-10 LAB — URINALYSIS, ROUTINE W REFLEX MICROSCOPIC
Bilirubin Urine: NEGATIVE
Glucose, UA: NEGATIVE mg/dL
Ketones, ur: NEGATIVE mg/dL
Leukocytes,Ua: NEGATIVE
Nitrite: POSITIVE — AB
Protein, ur: 100 mg/dL — AB
RBC / HPF: >50 RBC/hpf — ABNORMAL HIGH (ref 0–5)
Specific Gravity, Urine: 1.014 (ref 1.005–1.030)
WBC, UA: >50 WBC/hpf — ABNORMAL HIGH (ref 0–5)
pH: 6 (ref 5.0–8.0)

## 2020-01-10 LAB — COMPREHENSIVE METABOLIC PANEL WITH GFR
ALT: 15 U/L (ref 0–44)
AST: 21 U/L (ref 15–41)
Albumin: 3.8 g/dL (ref 3.5–5.0)
Alkaline Phosphatase: 39 U/L (ref 38–126)
Anion gap: 9 (ref 5–15)
BUN: 9 mg/dL (ref 6–20)
CO2: 27 mmol/L (ref 22–32)
Calcium: 8.5 mg/dL — ABNORMAL LOW (ref 8.9–10.3)
Chloride: 104 mmol/L (ref 98–111)
Creatinine, Ser: 0.71 mg/dL (ref 0.44–1.00)
GFR, Estimated: >60 mL/min
Glucose, Bld: 108 mg/dL — ABNORMAL HIGH (ref 70–99)
Potassium: 3.7 mmol/L (ref 3.5–5.1)
Sodium: 140 mmol/L (ref 135–145)
Total Bilirubin: 0.5 mg/dL (ref 0.3–1.2)
Total Protein: 6.7 g/dL (ref 6.5–8.1)

## 2020-01-10 LAB — CBC WITH DIFFERENTIAL/PLATELET
Abs Immature Granulocytes: 0.05 10*3/uL (ref 0.00–0.07)
Basophils Absolute: 0 10*3/uL (ref 0.0–0.1)
Basophils Relative: 0 %
Eosinophils Absolute: 0.1 10*3/uL (ref 0.0–0.5)
Eosinophils Relative: 1 %
HCT: 40.9 % (ref 36.0–46.0)
Hemoglobin: 12.7 g/dL (ref 12.0–15.0)
Immature Granulocytes: 0 %
Lymphocytes Relative: 7 %
Lymphs Abs: 0.8 10*3/uL (ref 0.7–4.0)
MCH: 26.9 pg (ref 26.0–34.0)
MCHC: 31.1 g/dL (ref 30.0–36.0)
MCV: 86.7 fL (ref 80.0–100.0)
Monocytes Absolute: 0.5 10*3/uL (ref 0.1–1.0)
Monocytes Relative: 4 %
Neutro Abs: 11 10*3/uL — ABNORMAL HIGH (ref 1.7–7.7)
Neutrophils Relative %: 88 %
Platelets: 364 10*3/uL (ref 150–400)
RBC: 4.72 MIL/uL (ref 3.87–5.11)
RDW: 20 % — ABNORMAL HIGH (ref 11.5–15.5)
WBC: 12.5 10*3/uL — ABNORMAL HIGH (ref 4.0–10.5)
nRBC: 0 % (ref 0.0–0.2)

## 2020-01-10 LAB — RESP PANEL BY RT PCR (RSV, FLU A&B, COVID)
Influenza A by PCR: NEGATIVE
Influenza B by PCR: NEGATIVE
Respiratory Syncytial Virus by PCR: NEGATIVE
SARS Coronavirus 2 by RT PCR: NEGATIVE

## 2020-01-10 LAB — POC URINE PREG, ED: Preg Test, Ur: NEGATIVE

## 2020-01-10 LAB — HIV ANTIBODY (ROUTINE TESTING W REFLEX): HIV Screen 4th Generation wRfx: NONREACTIVE

## 2020-01-10 MED ORDER — ONDANSETRON HCL 4 MG/2ML IJ SOLN
4.0000 mg | Freq: Four times a day (QID) | INTRAMUSCULAR | Status: DC | PRN
  Administered 2020-01-11 (×2): 4 mg via INTRAVENOUS
  Filled 2020-01-10 (×2): qty 2

## 2020-01-10 MED ORDER — SODIUM CHLORIDE 0.9 % IV SOLN
INTRAVENOUS | Status: DC | PRN
  Administered 2020-01-10: 250 mL via INTRAVENOUS

## 2020-01-10 MED ORDER — ACETAMINOPHEN 650 MG RE SUPP: 650.0000 mg | Freq: Four times a day (QID) | RECTAL | Status: DC | PRN

## 2020-01-10 MED ORDER — HYDROCHLOROTHIAZIDE 12.5 MG PO CAPS
12.5000 mg | ORAL_CAPSULE | Freq: Every day | ORAL | Status: DC
  Administered 2020-01-10 – 2020-01-11 (×2): 12.5 mg via ORAL
  Filled 2020-01-10 (×2): qty 1

## 2020-01-10 MED ORDER — SODIUM CHLORIDE 0.9 % IV SOLN
1.0000 g | Freq: Once | INTRAVENOUS | Status: AC
  Administered 2020-01-10: 1 g via INTRAVENOUS
  Filled 2020-01-10: qty 10

## 2020-01-10 MED ORDER — MORPHINE SULFATE (PF) 2 MG/ML IV SOLN
2.0000 mg | Freq: Once | INTRAVENOUS | Status: AC
  Administered 2020-01-10: 2 mg via INTRAVENOUS
  Filled 2020-01-10: qty 1

## 2020-01-10 MED ORDER — ONDANSETRON HCL 4 MG/2ML IJ SOLN
4.0000 mg | Freq: Once | INTRAMUSCULAR | Status: AC
  Administered 2020-01-10: 4 mg via INTRAVENOUS
  Filled 2020-01-10: qty 2

## 2020-01-10 MED ORDER — KETOROLAC TROMETHAMINE 60 MG/2ML IM SOLN
60.0000 mg | Freq: Once | INTRAMUSCULAR | Status: AC
  Administered 2020-01-10: 60 mg via INTRAMUSCULAR
  Filled 2020-01-10: qty 2

## 2020-01-10 MED ORDER — ACETAMINOPHEN 325 MG PO TABS
650.0000 mg | ORAL_TABLET | Freq: Four times a day (QID) | ORAL | Status: DC | PRN
  Administered 2020-01-10 – 2020-01-11 (×2): 650 mg via ORAL
  Filled 2020-01-10 (×2): qty 2

## 2020-01-10 MED ORDER — SODIUM CHLORIDE 0.9 % IV SOLN
1.0000 g | INTRAVENOUS | Status: DC
  Administered 2020-01-11: 1 g via INTRAVENOUS
  Filled 2020-01-10: qty 10

## 2020-01-10 MED ORDER — NITROFURANTOIN MONOHYD MACRO 100 MG PO CAPS
100.0000 mg | ORAL_CAPSULE | Freq: Once | ORAL | Status: AC
  Administered 2020-01-10: 100 mg via ORAL
  Filled 2020-01-10: qty 1

## 2020-01-10 MED ORDER — LACTATED RINGERS IV SOLN: INTRAVENOUS | Status: AC

## 2020-01-10 MED ORDER — KETOROLAC TROMETHAMINE 15 MG/ML IJ SOLN
15.0000 mg | Freq: Four times a day (QID) | INTRAMUSCULAR | Status: DC | PRN
  Administered 2020-01-11: 15 mg via INTRAVENOUS
  Filled 2020-01-10 (×2): qty 1

## 2020-01-10 MED ORDER — ONDANSETRON 4 MG PO TBDP
4.0000 mg | ORAL_TABLET | Freq: Once | ORAL | Status: AC
  Administered 2020-01-10: 4 mg via ORAL
  Filled 2020-01-10: qty 1

## 2020-01-10 MED ORDER — HYDROMORPHONE HCL 1 MG/ML IJ SOLN
0.5000 mg | INTRAMUSCULAR | Status: DC | PRN
  Administered 2020-01-10 – 2020-01-11 (×5): 0.5 mg via INTRAVENOUS
  Filled 2020-01-10 (×6): qty 0.5

## 2020-01-10 MED ORDER — ONDANSETRON HCL 4 MG PO TABS: 4.0000 mg | ORAL_TABLET | Freq: Four times a day (QID) | ORAL | Status: DC | PRN

## 2020-01-10 NOTE — H&P (Signed)
History and Physical    [name] VVO:[id number] DOB: 28-Nov-1980 DOA: 01/10/2020  PCP: Patient, [name]   Patient coming from: Home  Chief Complaint: Left flank pain  HPI: [name] is a 39 y.o. female with medical history significant for recurrent UTIs, hypertension, and iron deficiency anemia who presented to the ED with sudden onset left-sided flank pain that was sharp in nature with no radiation that began at approximately 4 AM.  She describes increased recent urinary frequency and some darker than normal urine as she has been using Azo for symptomatic management, but has not been getting relief.  She was seen by her urologist Dr. Retta Diones yesterday and was started on treatment with Macrobid for UTI with plans for renal ultrasound in the coming days.  She denies any fevers, chills, or vomiting, but does have some nausea.  She denies any hematuria.  No other complaints of chest pain, shortness of breath, headaches, or diarrhea noted.   ED Course: Stable vital signs noted and there is mild leukocytosis of 12,500.  CT renal stone study demonstrates a 6 x 4 mm renal stone impacting the UVJ on the left side with associated hydroureteronephrosis.  There is also some mild cholelithiasis noted without acute cholecystitis.  She has been started on some IV fluid and given Toradol for pain management and has been initiated on Rocephin.  Urology, Dr. Ronne Binning consulted with plans for stone extraction noted in a.m.  Review of Systems: All others reviewed and otherwise negative except as noted above.  Past Medical History:  Diagnosis Date  . Abnormal pap   . Anemia 10/20/2019   Add iron  . Anxiety   . Depression   . Elevated triglycerides with high cholesterol 10/20/2019   Try lifestyle modifications and consider meds too  . GERD (gastroesophageal reflux disease)   . Hypertension   . Vaginal Pap smear, abnormal     Past Surgical History:  Procedure Laterality Date  . CESAREAN  SECTION    . ectopic pregnancies    . left fallopian tube removal    . right tube removed     history of ectopic pregnancies     reports that she has never smoked. She has never used smokeless tobacco. She reports previous drug use. Drugs: Benzodiazepines and Marijuana. She reports that she does not drink alcohol.  Allergies  Allergen Reactions  . Augmentin [Amoxicillin-Pot Clavulanate] Other (See Comments)    Abdominal Pain    Family History  Problem Relation Age of Onset  . Hypertension Mother   . Cancer Maternal Grandmother        colon cancer  . Cancer Maternal Grandfather        colon cancer    Prior to Admission medications   Medication Sig Start Date End Date Taking? Authorizing Provider  ferrous sulfate 324 MG TBEC Take 324 mg by mouth.   Yes [provider]  hydrochlorothiazide (MICROZIDE) 12.5 MG capsule Take 1 capsule (12.5 mg total) by mouth daily. 10/18/19  Yes Adline Potter, NP  Multiple Vitamin (MULTIVITAMIN) tablet Take 1 tablet by mouth daily.   Yes [provider]  ciprofloxacin (CIPRO) 500 MG tablet Take 1 tablet (500 mg total) by mouth 2 (two) times daily. Patient not taking: Reported on 01/10/2020 11/28/19   Adline Potter, NP  nitrofurantoin, macrocrystal-monohydrate, (MACROBID) 100 MG capsule Take 1 capsule (100 mg total) by mouth 2 (two) times daily for 7 days. 01/09/20 01/16/20  Marcine Matar, MD  Physical Exam: Vitals:   01/10/20 1115 01/10/20 1130 01/10/20 1200 01/10/20 1230  BP:  137/76 (!) 153/83 (!) 147/86  Pulse: 76 82 74 75  Resp: 17 18 16 18   Temp:      TempSrc:      SpO2: 96% 98% 97% 95%  Weight:      Height:        Constitutional: NAD, calm, comfortable, obese Vitals:   01/10/20 1115 01/10/20 1130 01/10/20 1200 01/10/20 1230  BP:  137/76 (!) 153/83 (!) 147/86  Pulse: 76 82 74 75  Resp: 17 18 16 18   Temp:      TempSrc:      SpO2: 96% 98% 97% 95%  Weight:      Height:       Eyes: lids and  conjunctivae normal ENMT: Mucous membranes are moist.  Neck: normal, supple Respiratory: clear to auscultation bilaterally. Normal respiratory effort. No accessory muscle use.  Cardiovascular: Regular rate and rhythm, no murmurs. No extremity edema. Abdomen: no tenderness, no distention. Bowel sounds positive.  Musculoskeletal:  No joint deformity upper and lower extremities.   Skin: no rashes, lesions, ulcers.  Psychiatric: Normal judgment and insight. Alert and oriented x 3. Normal mood.   Labs on Admission: I have personally reviewed following labs and imaging studies  CBC: Recent Labs  Lab 01/10/20 1007  WBC 12.5*  NEUTROABS 11.0*  HGB 12.7  HCT 40.9  MCV 86.7  PLT 364   Basic Metabolic Panel: Recent Labs  Lab 01/10/20 1007  NA 140  K 3.7  CL 104  CO2 27  GLUCOSE 108*  BUN 9  CREATININE 0.71  CALCIUM 8.5*   GFR: Estimated Creatinine Clearance: 89.3 mL/min (by C-G formula based on SCr of 0.71 mg/dL). Liver Function Tests: Recent Labs  Lab 01/10/20 1007  AST 21  ALT 15  ALKPHOS 39  BILITOT 0.5  PROT 6.7  ALBUMIN 3.8   No results for input(s): LIPASE, AMYLASE in the last 168 hours. No results for input(s): AMMONIA in the last 168 hours. Coagulation Profile: No results for input(s): INR, PROTIME in the last 168 hours. Cardiac Enzymes: No results for input(s): CKTOTAL, CKMB, CKMBINDEX, TROPONINI in the last 168 hours. BNP (last 3 results) No results for input(s): PROBNP in the last 8760 hours. HbA1C: No results for input(s): HGBA1C in the last 72 hours. CBG: No results for input(s): GLUCAP in the last 168 hours. Lipid Profile: No results for input(s): CHOL, HDL, LDLCALC, TRIG, CHOLHDL, LDLDIRECT in the last 72 hours. Thyroid Function Tests: No results for input(s): TSH, T4TOTAL, FREET4, T3FREE, THYROIDAB in the last 72 hours. Anemia Panel: No results for input(s): VITAMINB12, FOLATE, FERRITIN, TIBC, IRON, RETICCTPCT in the last 72 hours. Urine  analysis:    Component Value Date/Time   COLORURINE AMBER (A) 01/10/2020 0954   APPEARANCEUR CLOUDY (A) 01/10/2020 0954   APPEARANCEUR Clear 01/09/2020 1346   LABSPEC 1.014 01/10/2020 0954   PHURINE 6.0 01/10/2020 0954   GLUCOSEU NEGATIVE 01/10/2020 0954   HGBUR LARGE (A) 01/10/2020 0954   BILIRUBINUR NEGATIVE 01/10/2020 0954   BILIRUBINUR Negative 01/09/2020 1346   KETONESUR NEGATIVE 01/10/2020 0954   PROTEINUR 100 (A) 01/10/2020 0954   NITRITE POSITIVE (A) 01/10/2020 0954   LEUKOCYTESUR NEGATIVE 01/10/2020 0954    Radiological Exams on Admission: CT Renal Stone Study  Result Date: 01/10/2020 CLINICAL DATA:  Left flank pain and nausea for 6 days EXAM: CT ABDOMEN AND PELVIS WITHOUT CONTRAST TECHNIQUE: Multidetector CT imaging of the abdomen and  pelvis was performed following the standard protocol without IV contrast. COMPARISON:  08/01/2015 FINDINGS: Lower chest: Mild left basilar atelectasis.  Heart size is normal. Hepatobiliary: Unremarkable unenhanced appearance of the liver. No focal liver lesion identified. Gallbladder within normal limits. Two peripherally calcified stones within the gallbladder lumen each measuring approximately 1.9 cm. No pericholecystic inflammatory changes by CT. No biliary dilatation. Pancreas: Unremarkable. No pancreatic ductal dilatation or surrounding inflammatory changes. Spleen: Normal in size without focal abnormality. Adrenals/Urinary Tract: 6 x 4 mm stone located at the left ureterovesical junction results in moderate left hydroureteronephrosis with left perinephric stranding. No left renal stone. Right kidney and ureter are normal in appearance. Urinary bladder is partially decompressed. Unremarkable adrenal glands. Stomach/Bowel: Stomach is within normal limits. Appendix appears normal (series 6, image 64). No evidence of bowel wall thickening, distention, or inflammatory changes. Vascular/Lymphatic: No significant vascular findings are evident on non  contrasted study. No enlarged abdominal or pelvic lymph nodes. Reproductive: Uterus and bilateral adnexa are unremarkable. Other: No free fluid. No abdominopelvic fluid collection. No pneumoperitoneum. No abdominal wall hernia. Musculoskeletal: No acute or significant osseous findings. IMPRESSION: 1. Obstructing 6 x 4 mm stone at the left UVJ results in moderate left hydroureteronephrosis. 2. Cholelithiasis without evidence of acute cholecystitis. Electronically Signed   By: Duanne Guess D.O.   On: 01/10/2020 10:10    Assessment/Plan Active Problems:   Hydroureteronephrosis    Left-sided hydroureteronephrosis secondary to infected nephrolithiasis -Clear liquid diet today and keep n.p.o. after midnight for anticipated stone extraction in a.m. per urology -Pain management with Toradol as needed -Zofran as needed for nausea or vomiting -Maintain on IV fluid, time-limited  Recurrent UTI secondary to above -Patient was recently started on Macrobid by urology -Continue on IV Rocephin while here and check urine culture  History of hypertension-controlled -Continue on home HCTZ and monitor blood pressure readings  History of iron deficiency anemia -Currently stable, follow CBC in a.m. -May resume iron supplementation at home once discharged  Obesity -Will need lifestyle changes in outpatient setting   DVT prophylaxis: SCDs Code Status: Full Family Communication: None at bedside Disposition Plan: Admit for treatment of hydroureteronephrosis Consults called: Urology Admission status: Inpatient, MedSurg   Ruthellen Tippy D Marina Boerner DO [facility]  If 7PM-7AM, please contact night-coverage [email or web address]  01/10/2020, 1:10 PM

## 2020-01-10 NOTE — Telephone Encounter (Signed)
Patient called and canceled the appointment for tomorrow. Patient states she wanted to let Victorino Dike know that she is admitted in the hospital for kidney stones and having surgery tomorrow

## 2020-01-10 NOTE — ED Triage Notes (Signed)
Pt c/o left flank pain, nausea, urinary frequency and difficulty urinating that started in the middle of last night suddenly. Pt saw an urologist yesterday for recurrent UTI and was scheduled for an [place] next Monday. Pt also c/o pelvic pressure with the recurrent UTIs. Pt was prescribed antibiotics yesterday but hasn't started it yet. Denies fever, vomiting.

## 2020-01-10 NOTE — H&P (View-Only) (Signed)
Urology Consult  Referring physician: Dr. Sherryll Burger Reason for referral: left ureteral calculus  Chief Complaint: left flank pain  History of Present Illness: [name] is a 39yo with a hx of recurrent UTI who presented to the ER with a 1 day hx of left flank pain. She was seen by Dr. Retta Diones yesterday for worsening lower urinary tract symptoms and bladder pressure.  Urine was sent for culture at that time and she was started on macrobid. Her LUTS worsened and she developed new left flank pain and presented to [facility]. She underwent Ct abd/pelvis which shwoed a 27mm left UVJ calculus. UA shows few bacteria. She is afebrile and WBC count is 12.5. This is her first stone event. Urine culture from yesterday is pending. Pain is currently controlled on narcotics. No other associated symptoms. No other exacerbating/alleviaiting events  Past Medical History:  Diagnosis Date  . Abnormal pap   . Anemia 10/20/2019   Add iron  . Anxiety   . Depression   . Elevated triglycerides with high cholesterol 10/20/2019   Try lifestyle modifications and consider meds too  . GERD (gastroesophageal reflux disease)   . Hypertension   . Vaginal Pap smear, abnormal    Past Surgical History:  Procedure Laterality Date  . CESAREAN SECTION    . ectopic pregnancies    . left fallopian tube removal    . right tube removed     history of ectopic pregnancies    Medications: I have reviewed the patient's current medications. Allergies:  Allergies  Allergen Reactions  . Augmentin [Amoxicillin-Pot Clavulanate] Other (See Comments)    Abdominal Pain    Family History  Problem Relation Age of Onset  . Hypertension Mother   . Cancer Maternal Grandmother        colon cancer  . Cancer Maternal Grandfather        colon cancer   Social History:  reports that she has never smoked. She has never used smokeless tobacco. She reports previous drug use. Drugs: Benzodiazepines and Marijuana. She reports that she does not  drink alcohol.  Review of Systems  Genitourinary: Positive for flank pain.  All other systems reviewed and are negative.   Physical Exam:  Vital signs in last 24 hours: Temp:  [98.6 F (37 C)] 98.6 F (37 C) (11/17 0837) Pulse Rate:  [72-100] 73 (11/17 1600) Resp:  [14-48] 20 (11/17 1600) BP: (126-176)/(71-89) 176/83 (11/17 1600) SpO2:  [94 %-100 %] 98 % (11/17 1600) Weight:  [81.6 kg] 81.6 kg (11/17 0834) Physical Exam Vitals and nursing note reviewed.  Constitutional:      Appearance: Normal appearance.  HENT:     Head: Normocephalic and atraumatic.     Nose: Nose normal.     Mouth/Throat:     Mouth: Mucous membranes are dry.  Eyes:     Extraocular Movements: Extraocular movements intact.     Pupils: Pupils are equal, round, and reactive to light.  Cardiovascular:     Rate and Rhythm: Normal rate and regular rhythm.  Pulmonary:     Effort: Pulmonary effort is normal. No respiratory distress.  Abdominal:     General: Abdomen is flat. There is no distension.  Musculoskeletal:        General: Normal range of motion.     Cervical back: Normal range of motion and neck supple.  Skin:    General: Skin is warm and dry.  Neurological:     General: No focal deficit present.  Mental Status: She is alert and oriented to person, place, and time.  Psychiatric:        Mood and Affect: Mood normal.        Behavior: Behavior normal.        Thought Content: Thought content normal.        Judgment: Judgment normal.     Laboratory Data:  Results for orders placed or performed during the hospital encounter of 01/10/20 (from the past 72 hour(s))  POC urine preg, ED (not at [facility])     Status: None   Collection Time: 01/10/20  9:33 AM  Result Value Ref Range   Preg Test, Ur NEGATIVE NEGATIVE    Comment:        THE SENSITIVITY OF THIS METHODOLOGY IS >24 mIU/mL   Urinalysis, Routine w reflex microscopic Urine, Clean Catch     Status: Abnormal   Collection Time: 01/10/20  9:54  AM  Result Value Ref Range   Color, Urine AMBER (A) YELLOW    Comment: BIOCHEMICALS MAY BE AFFECTED BY COLOR   APPearance CLOUDY (A) CLEAR   Specific Gravity, Urine 1.014 1.005 - 1.030   pH 6.0 5.0 - 8.0   Glucose, UA NEGATIVE NEGATIVE mg/dL   Hgb urine dipstick LARGE (A) NEGATIVE   Bilirubin Urine NEGATIVE NEGATIVE   Ketones, ur NEGATIVE NEGATIVE mg/dL   Protein, ur 161100 (A) NEGATIVE mg/dL   Nitrite POSITIVE (A) NEGATIVE   Leukocytes,Ua NEGATIVE NEGATIVE   RBC / HPF >50 (H) 0 - 5 RBC/hpf   WBC, UA >50 (H) 0 - 5 WBC/hpf   Bacteria, UA RARE (A) NONE SEEN   WBC Clumps PRESENT    Mucus PRESENT    Budding Yeast PRESENT    Uric Acid Crys, UA PRESENT     Comment: Performed at [facility], [address] [place], [place] [address]  CBC with Differential/Platelet     Status: Abnormal   Collection Time: 01/10/20 10:07 AM  Result Value Ref Range   WBC 12.5 (H) 4.0 - 10.5 K/uL   RBC 4.72 3.87 - 5.11 MIL/uL   Hemoglobin 12.7 12.0 - 15.0 g/dL   HCT 54.040.9 36 - 46 %   MCV 86.7 80.0 - 100.0 fL   MCH 26.9 26.0 - 34.0 pg   MCHC 31.1 30.0 - 36.0 g/dL   RDW 98.120.0 (H) 19.111.5 - 47.815.5 %   Platelets 364 150 - 400 K/uL   nRBC 0.0 0.0 - 0.2 %   Neutrophils Relative % 88 %   Neutro Abs 11.0 (H) 1.7 - 7.7 K/uL   Lymphocytes Relative 7 %   Lymphs Abs 0.8 0.7 - 4.0 K/uL   Monocytes Relative 4 %   Monocytes Absolute 0.5 0.1 - 1.0 K/uL   Eosinophils Relative 1 %   Eosinophils Absolute 0.1 0.0 - 0.5 K/uL   Basophils Relative 0 %   Basophils Absolute 0.0 0.0 - 0.1 K/uL   Immature Granulocytes 0 %   Abs Immature Granulocytes 0.05 0.00 - 0.07 K/uL    Comment: Performed at [facility], [address] [place], [place] [address]  Comprehensive metabolic panel     Status: Abnormal   Collection Time: 01/10/20 10:07 AM  Result Value Ref Range   Sodium 140 135 - 145 mmol/L   Potassium 3.7 3.5 - 5.1 mmol/L   Chloride 104 98 - 111 mmol/L   CO2 27 22 - 32 mmol/L   Glucose, Bld 108 (H) 70 - 99 mg/dL    Comment:  Glucose reference range applies only to samples taken after fasting for at least 8 hours.   BUN 9 6 - 20 mg/dL   Creatinine, Ser 1.61 0.44 - 1.00 mg/dL   Calcium 8.5 (L) 8.9 - 10.3 mg/dL   Total Protein 6.7 6.5 - 8.1 g/dL   Albumin 3.8 3.5 - 5.0 g/dL   AST 21 15 - 41 U/L   ALT 15 0 - 44 U/L   Alkaline Phosphatase 39 38 - 126 U/L   Total Bilirubin 0.5 0.3 - 1.2 mg/dL   GFR, Estimated >09 >60 mL/min    Comment: (NOTE) Calculated using the CKD-EPI Creatinine Equation (2021)    Anion gap 9 5 - 15    Comment: Performed at [facility], [address], [place], [place] [address]  Resp Panel by RT PCR (RSV, Flu A&B, Covid) - Nasopharyngeal Swab     Status: None   Collection Time: 01/10/20 11:54 AM   Specimen: Nasopharyngeal Swab  Result Value Ref Range   SARS Coronavirus 2 by RT PCR NEGATIVE NEGATIVE    Comment: (NOTE) SARS-CoV-2 target nucleic acids are NOT DETECTED.  The SARS-CoV-2 RNA is generally detectable in upper respiratoy specimens during the acute phase of infection. The lowest concentration of SARS-CoV-2 viral copies this assay can detect is 131 copies/mL. A negative result does not preclude SARS-Cov-2 infection and should not be used as the sole basis for treatment or other patient management decisions. A negative result may occur with  improper specimen collection/handling, submission of specimen other than nasopharyngeal swab, presence of viral mutation(s) within the areas targeted by this assay, and inadequate number of viral copies (<131 copies/mL). A negative result must be combined with clinical observations, patient history, and epidemiological information. The expected result is Negative.  Fact Sheet for Patients:  https://www.moore.com/  Fact Sheet for Healthcare Providers:  https://www.young.biz/  This test is no t yet approved or cleared by the [place] FDA and  has been authorized for detection and/or diagnosis of  SARS-CoV-2 by FDA under an Emergency Use Authorization (EUA). This EUA will remain  in effect (meaning this test can be used) for the duration of the COVID-19 declaration under Section 564(b)(1) of the Act, 21 U.S.C. section 360bbb-3(b)(1), unless the authorization is terminated or revoked sooner.     Influenza A by PCR NEGATIVE NEGATIVE   Influenza B by PCR NEGATIVE NEGATIVE    Comment: (NOTE) The Xpert Xpress SARS-CoV-2/FLU/RSV assay is intended as an aid in  the diagnosis of influenza from Nasopharyngeal swab specimens and  should not be used as a sole basis for treatment. Nasal washings and  aspirates are unacceptable for Xpert Xpress SARS-CoV-2/FLU/RSV  testing.  Fact Sheet for Patients: https://www.moore.com/  Fact Sheet for Healthcare Providers: https://www.young.biz/  This test is not yet approved or cleared by the [place] FDA and  has been authorized for detection and/or diagnosis of SARS-CoV-2 by  FDA under an Emergency Use Authorization (EUA). This EUA will remain  in effect (meaning this test can be used) for the duration of the  Covid-19 declaration under Section 564(b)(1) of the Act, 21  U.S.C. section 360bbb-3(b)(1), unless the authorization is  terminated or revoked.    Respiratory Syncytial Virus by PCR NEGATIVE NEGATIVE    Comment: (NOTE) Fact Sheet for Patients: https://www.moore.com/  Fact Sheet for Healthcare Providers: https://www.young.biz/  This test is not yet approved or cleared by the [place] FDA and  has been authorized for detection and/or diagnosis of SARS-CoV-2 by  FDA under an Emergency Use Authorization (EUA). This EUA will remain  in effect (meaning this test can be used) for the duration of the  COVID-19 declaration under Section 564(b)(1) of the Act, 21 U.S.C.  section 360bbb-3(b)(1), unless the authorization is terminated or  revoked. Performed at  [facility], [address] [place], [place] [address]    Recent Results (from the past 240 hour(s))  Microscopic Examination     Status: Abnormal   Collection Time: 01/09/20  1:46 PM   Urine  Result Value Ref Range Status   WBC, UA 0-5 0 - 5 /hpf Final   RBC >30 (A) 0 - 2 /hpf Final   Epithelial Cells (non renal) >10 (A) 0 - 10 /hpf Final   Renal Epithel, UA None seen None seen /hpf Final   Bacteria, UA Few None seen/Few Final  Resp Panel by RT PCR (RSV, Flu A&B, Covid) - Nasopharyngeal Swab     Status: None   Collection Time: 01/10/20 11:54 AM   Specimen: Nasopharyngeal Swab  Result Value Ref Range Status   SARS Coronavirus 2 by RT PCR NEGATIVE NEGATIVE Final    Comment: (NOTE) SARS-CoV-2 target nucleic acids are NOT DETECTED.  The SARS-CoV-2 RNA is generally detectable in upper respiratoy specimens during the acute phase of infection. The lowest concentration of SARS-CoV-2 viral copies this assay can detect is 131 copies/mL. A negative result does not preclude SARS-Cov-2 infection and should not be used as the sole basis for treatment or other patient management decisions. A negative result may occur with  improper specimen collection/handling, submission of specimen other than nasopharyngeal swab, presence of viral mutation(s) within the areas targeted by this assay, and inadequate number of viral copies (<131 copies/mL). A negative result must be combined with clinical observations, patient history, and epidemiological information. The expected result is Negative.  Fact Sheet for Patients:  https://www.moore.com/  Fact Sheet for Healthcare Providers:  https://www.young.biz/  This test is no t yet approved or cleared by the [place] FDA and  has been authorized for detection and/or diagnosis of SARS-CoV-2 by FDA under an Emergency Use Authorization (EUA). This EUA will remain  in effect (meaning this test can be used) for the  duration of the COVID-19 declaration under Section 564(b)(1) of the Act, 21 U.S.C. section 360bbb-3(b)(1), unless the authorization is terminated or revoked sooner.     Influenza A by PCR NEGATIVE NEGATIVE Final   Influenza B by PCR NEGATIVE NEGATIVE Final    Comment: (NOTE) The Xpert Xpress SARS-CoV-2/FLU/RSV assay is intended as an aid in  the diagnosis of influenza from Nasopharyngeal swab specimens and  should not be used as a sole basis for treatment. Nasal washings and  aspirates are unacceptable for Xpert Xpress SARS-CoV-2/FLU/RSV  testing.  Fact Sheet for Patients: https://www.moore.com/  Fact Sheet for Healthcare Providers: https://www.young.biz/  This test is not yet approved or cleared by the [place] FDA and  has been authorized for detection and/or diagnosis of SARS-CoV-2 by  FDA under an Emergency Use Authorization (EUA). This EUA will remain  in effect (meaning this test can be used) for the duration of the  Covid-19 declaration under Section 564(b)(1) of the Act, 21  U.S.C. section 360bbb-3(b)(1), unless the authorization is  terminated or revoked.    Respiratory Syncytial Virus by PCR NEGATIVE NEGATIVE Final    Comment: (NOTE) Fact Sheet for Patients: https://www.moore.com/  Fact Sheet for Healthcare Providers: https://www.young.biz/  This test is not yet approved or cleared by  the [employer] and  has been authorized for detection and/or diagnosis of SARS-CoV-2 by  FDA under an Emergency Use Authorization (EUA). This EUA will remain  in effect (meaning this test can be used) for the duration of the  COVID-19 declaration under Section 564(b)(1) of the Act, 21 U.S.C.  section 360bbb-3(b)(1), unless the authorization is terminated or  revoked. Performed at [facility], [address] [place], [place] [address]    Creatinine: Recent Labs    01/10/20 1007  CREATININE  0.71   Baseline Creatinine: 0.7  Impression/Assessment:  39yo with left ureterla stoen and UTI  Plan:  -We discussed the management of kidney stones. These options include observation, ureteroscopy, shockwave lithotripsy (ESWL) and percutaneous nephrolithotomy (PCNL). We discussed which options are relevant to the patient's stone(s). We discussed the natural history of kidney stones as well as the complications of untreated stones and the impact on quality of life without treatment as well as with each of the above listed treatments. We also discussed the efficacy of each treatment in its ability to clear the stone burden. With any of these management options I discussed the signs and symptoms of infection and the need for emergent treatment should these be experienced. For each option we discussed the ability of each procedure to clear the patient of their stone burden.   For observation I described the risks which include but are not limited to silent renal damage, life-threatening infection, need for emergent surgery, failure to pass stone and pain.   For ureteroscopy I described the risks which include bleeding, infection, damage to contiguous structures, positioning injury, ureteral stricture, ureteral avulsion, ureteral injury, need for prolonged ureteral stent, inability to perform ureteroscopy, need for an interval procedure, inability to clear stone burden, stent discomfort/pain, heart attack, stroke, pulmonary embolus and the inherent risks with general anesthesia.   For shockwave lithotripsy I described the risks which include arrhythmia, kidney contusion, kidney hemorrhage, need for transfusion, pain, inability to adequately break up stone, inability to pass stone fragments, Steinstrasse, infection associated with obstructing stones, need for alternate surgical procedure, need for repeat shockwave lithotripsy, MI, CVA, PE and the inherent risks with anesthesia/conscious sedation.   For  PCNL I described the risks including positioning injury, pneumothorax, hydrothorax, need for chest tube, inability to clear stone burden, renal laceration, arterial venous fistula or malformation, need for embolization of kidney, loss of kidney or renal function, need for repeat procedure, need for prolonged nephrostomy tube, ureteral avulsion, MI, CVA, PE and the inherent risks of general anesthesia.   - The patient would like to proceed with Left ureteral stent placement. Please make her NPO after midnight for procedure tomorrow morning  Wilkie Aye 01/10/2020, 4:41 PM

## 2020-01-10 NOTE — ED Provider Notes (Signed)
[facility] EMERGENCY DEPARTMENT Provider Note   CSN: [id number] Arrival date & time: 01/10/20  0747     History Chief Complaint  Patient presents with  . Flank Pain    [name] is a 39 y.o. female presenting for evaluation of acute on nearly chronic dysuria since August.  She reports having multiple episodes of UTIs that have historically been treated by her gynecologist.  She recently establish care with urology given the frequency of these infections.  She was seen by Dr. Retta Diones yesterday and was treated for an acute UTI, prescribed Macrobid which she has not been able to obtain from her pharmacy.  She presents here secondary to increased pain localizing to her left flank that radiates into her left lower abdomen.  Additionally she describes increased urinary frequency, dysuria, darker than normal urine although she has been using Azo without any symptom relief.  She denies fevers or chills, she does endorse nausea without emesis.  She also denies diarrhea, abdominal distention, no GYN complaints.  She is scheduled for an outpatient renal ultrasound as she states Dr. Retta Diones was concerned about possibility of a kidney stone as well.  This has been scheduled for next Monday.  HPI     Past Medical History:  Diagnosis Date  . Abnormal pap   . Anemia 10/20/2019   Add iron  . Anxiety   . Depression   . Elevated triglycerides with high cholesterol 10/20/2019   Try lifestyle modifications and consider meds too  . GERD (gastroesophageal reflux disease)   . Hypertension   . Vaginal Pap smear, abnormal     Patient Active Problem List   Diagnosis Date Noted  . Elevated triglycerides with high cholesterol 10/20/2019  . Anemia 10/20/2019  . Dyspareunia, female 10/18/2019  . Encounter for gynecological examination with Papanicolaou smear of cervix 10/18/2019  . Frequent headaches 10/18/2019  . Tired 10/18/2019  . Elevated BP without diagnosis of hypertension 10/18/2019  .  History of abnormal cervical Pap smear 10/18/2019  . 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 Gastroesophageal reflux disease without esophagitis 03/17/2016  . Unspecified gastritis and gastroduodenitis without mention of hemorrhage 10/02/2013  . Depression with anxiety 06/22/2013    Past Surgical History:  Procedure Laterality Date  . CESAREAN SECTION    . ectopic pregnancies    . left fallopian tube removal    . right tube removed     history of ectopic pregnancies     OB History    Gravida  4   Para  1   Term  1   Preterm      AB  3   Living  1     SAB      TAB      Ectopic  3   Multiple      Live Births              Family History  Problem Relation Age of Onset  . Hypertension Mother   . Cancer Maternal Grandmother        colon cancer  . Cancer Maternal Grandfather        colon cancer    Social History   Tobacco Use  . Smoking status: Never Smoker  . Smokeless tobacco: Never Used  Vaping Use  . Vaping Use: Never used  Substance Use Topics  . Alcohol use: No  . Drug use: Not Currently    Types: Benzodiazepines, Marijuana    Comment: opiates- Former    Home Medications  Prior to Admission medications   Medication Sig Start Date End Date Taking? Authorizing Provider  cholecalciferol (VITAMIN D3) 25 MCG (1000 UNIT) tablet Take 1,000 Units by mouth daily.    [provider]  ciprofloxacin (CIPRO) 500 MG tablet Take 1 tablet (500 mg total) by mouth 2 (two) times daily. Patient not taking: Reported on 01/10/2020 11/28/19   [name] A, NP  docusate sodium (COLACE) 100 MG capsule Take 100 mg by mouth 2 (two) times daily.    [provider]  ferrous sulfate 324 MG TBEC Take 324 mg by mouth.    [provider]  hydrochlorothiazide (MICROZIDE) 12.5 MG capsule Take 1 capsule (12.5 mg total) by mouth daily. 10/18/19   Adline Potter, NP  ibuprofen (ADVIL) 100 MG/5ML suspension Take 200 mg by mouth every 4 (four)  hours as needed.    [provider]  Multiple Vitamin (MULTIVITAMIN) tablet Take 1 tablet by mouth daily.    [provider]  nitrofurantoin, macrocrystal-monohydrate, (MACROBID) 100 MG capsule Take 1 capsule (100 mg total) by mouth 2 (two) times daily for 7 days. 01/09/20 01/16/20  Marcine Matar, MD    Allergies    Augmentin [amoxicillin-pot clavulanate]  Review of Systems   Review of Systems  Constitutional: Negative for chills and fever.  HENT: Negative for congestion and sore throat.   Eyes: Negative.   Respiratory: Negative for chest tightness and shortness of breath.   Cardiovascular: Negative for chest pain.  Gastrointestinal: Positive for abdominal pain and nausea. Negative for diarrhea and vomiting.  Genitourinary: Positive for dysuria, frequency and urgency. Negative for menstrual problem and vaginal discharge.  Musculoskeletal: Negative for arthralgias, joint swelling and neck pain.  Skin: Negative.  Negative for rash and wound.  Neurological: Negative for dizziness, weakness, light-headedness, numbness and headaches.  Psychiatric/Behavioral: Negative.   All other systems reviewed and are negative.   Physical Exam Updated Vital Signs BP 131/75   Pulse 74   Temp 98.6 F (37 C) (Oral)   Resp 15   Ht 5' (1.524 m)   Wt 81.6 kg   LMP  (Within Weeks)   SpO2 97%   BMI 35.15 kg/m   Physical Exam Vitals and nursing note reviewed.  Constitutional:      Appearance: She is well-developed.  HENT:     Head: Normocephalic and atraumatic.  Eyes:     Conjunctiva/sclera: Conjunctivae normal.  Cardiovascular:     Rate and Rhythm: Normal rate and regular rhythm.     Heart sounds: Normal heart sounds.  Pulmonary:     Effort: Pulmonary effort is normal.     Breath sounds: Normal breath sounds. No wheezing.  Abdominal:     General: Bowel sounds are normal.     Palpations: Abdomen is soft.     Tenderness: There is no abdominal tenderness. There is left  CVA tenderness. There is no guarding or rebound.  Musculoskeletal:        General: Normal range of motion.     Cervical back: Normal range of motion.  Skin:    General: Skin is warm and dry.  Neurological:     Mental Status: She is alert.     ED Results / Procedures / Treatments   Labs (all labs ordered are listed, but only abnormal results are displayed) Labs Reviewed  URINALYSIS, ROUTINE W REFLEX MICROSCOPIC - Abnormal; Notable for the following components:      Result Value   Color, Urine AMBER (*)    APPearance  CLOUDY (*)    Hgb urine dipstick LARGE (*)    Protein, ur 100 (*)    Nitrite POSITIVE (*)    RBC / HPF >50 (*)    WBC, UA >50 (*)    Bacteria, UA RARE (*)    All other components within normal limits  CBC WITH DIFFERENTIAL/PLATELET  COMPREHENSIVE METABOLIC PANEL  POC URINE PREG, ED    EKG None  Radiology CT Renal Stone Study  Result Date: 01/10/2020 CLINICAL DATA:  Left flank pain and nausea for 6 days EXAM: CT ABDOMEN AND PELVIS WITHOUT CONTRAST TECHNIQUE: Multidetector CT imaging of the abdomen and pelvis was performed following the standard protocol without IV contrast. COMPARISON:  08/01/2015 FINDINGS: Lower chest: Mild left basilar atelectasis.  Heart size is normal. Hepatobiliary: Unremarkable unenhanced appearance of the liver. No focal liver lesion identified. Gallbladder within normal limits. Two peripherally calcified stones within the gallbladder lumen each measuring approximately 1.9 cm. No pericholecystic inflammatory changes by CT. No biliary dilatation. Pancreas: Unremarkable. No pancreatic ductal dilatation or surrounding inflammatory changes. Spleen: Normal in size without focal abnormality. Adrenals/Urinary Tract: 6 x 4 mm stone located at the left ureterovesical junction results in moderate left hydroureteronephrosis with left perinephric stranding. No left renal stone. Right kidney and ureter are normal in appearance. Urinary bladder is partially  decompressed. Unremarkable adrenal glands. Stomach/Bowel: Stomach is within normal limits. Appendix appears normal (series 6, image 64). No evidence of bowel wall thickening, distention, or inflammatory changes. Vascular/Lymphatic: No significant vascular findings are evident on non contrasted study. No enlarged abdominal or pelvic lymph nodes. Reproductive: Uterus and bilateral adnexa are unremarkable. Other: No free fluid. No abdominopelvic fluid collection. No pneumoperitoneum. No abdominal wall hernia. Musculoskeletal: No acute or significant osseous findings. IMPRESSION: 1. Obstructing 6 x 4 mm stone at the left UVJ results in moderate left hydroureteronephrosis. 2. Cholelithiasis without evidence of acute cholecystitis. Electronically Signed   By: Duanne Guess D.O.   On: 01/10/2020 10:10    Procedures Procedures (including critical care time)  Medications Ordered in ED Medications  nitrofurantoin (macrocrystal-monohydrate) (MACROBID) capsule 100 mg (100 mg Oral Given 01/10/20 0941)  ondansetron (ZOFRAN-ODT) disintegrating tablet 4 mg (4 mg Oral Given 01/10/20 0941)  ketorolac (TORADOL) injection 60 mg (60 mg Intramuscular Given 01/10/20 9622)    ED Course  I have reviewed the triage vital signs and the nursing notes.  Pertinent labs & imaging results that were available during my care of the patient were reviewed by me and considered in my medical decision making (see chart for details).    MDM Rules/Calculators/A&P                          10:46 AM Results discussed with pt.  Call placed to urology.  Spoke with Dr. Ronne Binning, recommends IV rocephin and plan for hospitalist admission.  Will plan procedure this afternoon.  Spoke with Dr. Sherryll Burger who will plan admission.     Final Clinical Impression(s) / ED Diagnoses Final diagnoses:  Ureterolithiasis  Acute cystitis with hematuria    Rx / DC Orders ED Discharge Orders    None       Victoriano Lain 01/10/20 1150      Bethann Berkshire, MD 01/12/20 [phone]

## 2020-01-10 NOTE — Consult Note (Signed)
Urology Consult  Referring physician: Dr. Sherryll Burger Reason for referral: left ureteral calculus  Chief Complaint: left flank pain  History of Present Illness: [name] is a 39yo with a hx of recurrent UTI who presented to the ER with a 1 day hx of left flank pain. She was seen by Dr. Retta Diones yesterday for worsening lower urinary tract symptoms and bladder pressure.  Urine was sent for culture at that time and she was started on macrobid. Her LUTS worsened and she developed new left flank pain and presented to [facility]. She underwent Ct abd/pelvis which shwoed a 27mm left UVJ calculus. UA shows few bacteria. She is afebrile and WBC count is 12.5. This is her first stone event. Urine culture from yesterday is pending. Pain is currently controlled on narcotics. No other associated symptoms. No other exacerbating/alleviaiting events  Past Medical History:  Diagnosis Date  . Abnormal pap   . Anemia 10/20/2019   Add iron  . Anxiety   . Depression   . Elevated triglycerides with high cholesterol 10/20/2019   Try lifestyle modifications and consider meds too  . GERD (gastroesophageal reflux disease)   . Hypertension   . Vaginal Pap smear, abnormal    Past Surgical History:  Procedure Laterality Date  . CESAREAN SECTION    . ectopic pregnancies    . left fallopian tube removal    . right tube removed     history of ectopic pregnancies    Medications: I have reviewed the patient's current medications. Allergies:  Allergies  Allergen Reactions  . Augmentin [Amoxicillin-Pot Clavulanate] Other (See Comments)    Abdominal Pain    Family History  Problem Relation Age of Onset  . Hypertension Mother   . Cancer Maternal Grandmother        colon cancer  . Cancer Maternal Grandfather        colon cancer   Social History:  reports that she has never smoked. She has never used smokeless tobacco. She reports previous drug use. Drugs: Benzodiazepines and Marijuana. She reports that she does not  drink alcohol.  Review of Systems  Genitourinary: Positive for flank pain.  All other systems reviewed and are negative.   Physical Exam:  Vital signs in last 24 hours: Temp:  [98.6 F (37 C)] 98.6 F (37 C) (11/17 0837) Pulse Rate:  [72-100] 73 (11/17 1600) Resp:  [14-48] 20 (11/17 1600) BP: (126-176)/(71-89) 176/83 (11/17 1600) SpO2:  [94 %-100 %] 98 % (11/17 1600) Weight:  [81.6 kg] 81.6 kg (11/17 0834) Physical Exam Vitals and nursing note reviewed.  Constitutional:      Appearance: Normal appearance.  HENT:     Head: Normocephalic and atraumatic.     Nose: Nose normal.     Mouth/Throat:     Mouth: Mucous membranes are dry.  Eyes:     Extraocular Movements: Extraocular movements intact.     Pupils: Pupils are equal, round, and reactive to light.  Cardiovascular:     Rate and Rhythm: Normal rate and regular rhythm.  Pulmonary:     Effort: Pulmonary effort is normal. No respiratory distress.  Abdominal:     General: Abdomen is flat. There is no distension.  Musculoskeletal:        General: Normal range of motion.     Cervical back: Normal range of motion and neck supple.  Skin:    General: Skin is warm and dry.  Neurological:     General: No focal deficit present.  Mental Status: She is alert and oriented to person, place, and time.  Psychiatric:        Mood and Affect: Mood normal.        Behavior: Behavior normal.        Thought Content: Thought content normal.        Judgment: Judgment normal.     Laboratory Data:  Results for orders placed or performed during the hospital encounter of 01/10/20 (from the past 72 hour(s))  POC urine preg, ED (not at [facility])     Status: None   Collection Time: 01/10/20  9:33 AM  Result Value Ref Range   Preg Test, Ur NEGATIVE NEGATIVE    Comment:        THE SENSITIVITY OF THIS METHODOLOGY IS >24 mIU/mL   Urinalysis, Routine w reflex microscopic Urine, Clean Catch     Status: Abnormal   Collection Time: 01/10/20  9:54  AM  Result Value Ref Range   Color, Urine AMBER (A) YELLOW    Comment: BIOCHEMICALS MAY BE AFFECTED BY COLOR   APPearance CLOUDY (A) CLEAR   Specific Gravity, Urine 1.014 1.005 - 1.030   pH 6.0 5.0 - 8.0   Glucose, UA NEGATIVE NEGATIVE mg/dL   Hgb urine dipstick LARGE (A) NEGATIVE   Bilirubin Urine NEGATIVE NEGATIVE   Ketones, ur NEGATIVE NEGATIVE mg/dL   Protein, ur 161100 (A) NEGATIVE mg/dL   Nitrite POSITIVE (A) NEGATIVE   Leukocytes,Ua NEGATIVE NEGATIVE   RBC / HPF >50 (H) 0 - 5 RBC/hpf   WBC, UA >50 (H) 0 - 5 WBC/hpf   Bacteria, UA RARE (A) NONE SEEN   WBC Clumps PRESENT    Mucus PRESENT    Budding Yeast PRESENT    Uric Acid Crys, UA PRESENT     Comment: Performed at [facility], [address] [place], [place] [address]  CBC with Differential/Platelet     Status: Abnormal   Collection Time: 01/10/20 10:07 AM  Result Value Ref Range   WBC 12.5 (H) 4.0 - 10.5 K/uL   RBC 4.72 3.87 - 5.11 MIL/uL   Hemoglobin 12.7 12.0 - 15.0 g/dL   HCT 54.040.9 36 - 46 %   MCV 86.7 80.0 - 100.0 fL   MCH 26.9 26.0 - 34.0 pg   MCHC 31.1 30.0 - 36.0 g/dL   RDW 98.120.0 (H) 19.111.5 - 47.815.5 %   Platelets 364 150 - 400 K/uL   nRBC 0.0 0.0 - 0.2 %   Neutrophils Relative % 88 %   Neutro Abs 11.0 (H) 1.7 - 7.7 K/uL   Lymphocytes Relative 7 %   Lymphs Abs 0.8 0.7 - 4.0 K/uL   Monocytes Relative 4 %   Monocytes Absolute 0.5 0.1 - 1.0 K/uL   Eosinophils Relative 1 %   Eosinophils Absolute 0.1 0.0 - 0.5 K/uL   Basophils Relative 0 %   Basophils Absolute 0.0 0.0 - 0.1 K/uL   Immature Granulocytes 0 %   Abs Immature Granulocytes 0.05 0.00 - 0.07 K/uL    Comment: Performed at [facility], [address] [place], [place] [address]  Comprehensive metabolic panel     Status: Abnormal   Collection Time: 01/10/20 10:07 AM  Result Value Ref Range   Sodium 140 135 - 145 mmol/L   Potassium 3.7 3.5 - 5.1 mmol/L   Chloride 104 98 - 111 mmol/L   CO2 27 22 - 32 mmol/L   Glucose, Bld 108 (H) 70 - 99 mg/dL    Comment:  Glucose reference range applies only to samples taken after fasting for at least 8 hours.   BUN 9 6 - 20 mg/dL   Creatinine, Ser 0.71 0.44 - 1.00 mg/dL   Calcium 8.5 (L) 8.9 - 10.3 mg/dL   Total Protein 6.7 6.5 - 8.1 g/dL   Albumin 3.8 3.5 - 5.0 g/dL   AST 21 15 - 41 U/L   ALT 15 0 - 44 U/L   Alkaline Phosphatase 39 38 - 126 U/L   Total Bilirubin 0.5 0.3 - 1.2 mg/dL   GFR, Estimated >60 >60 mL/min    Comment: (NOTE) Calculated using the CKD-EPI Creatinine Equation (2021)    Anion gap 9 5 - 15    Comment: Performed at [facility], [address] [place], [place] [address]  Resp Panel by RT PCR (RSV, Flu A&B, Covid) - Nasopharyngeal Swab     Status: None   Collection Time: 01/10/20 11:54 AM   Specimen: Nasopharyngeal Swab  Result Value Ref Range   SARS Coronavirus 2 by RT PCR NEGATIVE NEGATIVE    Comment: (NOTE) SARS-CoV-2 target nucleic acids are NOT DETECTED.  The SARS-CoV-2 RNA is generally detectable in upper respiratoy specimens during the acute phase of infection. The lowest concentration of SARS-CoV-2 viral copies this assay can detect is 131 copies/mL. A negative result does not preclude SARS-Cov-2 infection and should not be used as the sole basis for treatment or other patient management decisions. A negative result may occur with  improper specimen collection/handling, submission of specimen other than nasopharyngeal swab, presence of viral mutation(s) within the areas targeted by this assay, and inadequate number of viral copies (<131 copies/mL). A negative result must be combined with clinical observations, patient history, and epidemiological information. The expected result is Negative.  Fact Sheet for Patients:  https://www.fda.gov/media/142436/download  Fact Sheet for Healthcare Providers:  https://www.fda.gov/media/142435/download  This test is no t yet approved or cleared by the [place] FDA and  has been authorized for detection and/or diagnosis of  SARS-CoV-2 by FDA under an Emergency Use Authorization (EUA). This EUA will remain  in effect (meaning this test can be used) for the duration of the COVID-19 declaration under Section 564(b)(1) of the Act, 21 U.S.C. section 360bbb-3(b)(1), unless the authorization is terminated or revoked sooner.     Influenza A by PCR NEGATIVE NEGATIVE   Influenza B by PCR NEGATIVE NEGATIVE    Comment: (NOTE) The Xpert Xpress SARS-CoV-2/FLU/RSV assay is intended as an aid in  the diagnosis of influenza from Nasopharyngeal swab specimens and  should not be used as a sole basis for treatment. Nasal washings and  aspirates are unacceptable for Xpert Xpress SARS-CoV-2/FLU/RSV  testing.  Fact Sheet for Patients: https://www.fda.gov/media/142436/download  Fact Sheet for Healthcare Providers: https://www.fda.gov/media/142435/download  This test is not yet approved or cleared by the [place] FDA and  has been authorized for detection and/or diagnosis of SARS-CoV-2 by  FDA under an Emergency Use Authorization (EUA). This EUA will remain  in effect (meaning this test can be used) for the duration of the  Covid-19 declaration under Section 564(b)(1) of the Act, 21  U.S.C. section 360bbb-3(b)(1), unless the authorization is  terminated or revoked.    Respiratory Syncytial Virus by PCR NEGATIVE NEGATIVE    Comment: (NOTE) Fact Sheet for Patients: https://www.fda.gov/media/142436/download  Fact Sheet for Healthcare Providers: https://www.fda.gov/media/142435/download  This test is not yet approved or cleared by the [place] FDA and  has been authorized for detection and/or diagnosis of SARS-CoV-2 by    FDA under an Emergency Use Authorization (EUA). This EUA will remain  in effect (meaning this test can be used) for the duration of the  COVID-19 declaration under Section 564(b)(1) of the Act, 21 U.S.C.  section 360bbb-3(b)(1), unless the authorization is terminated or  revoked. Performed at  [facility], [address] [place], [place] [address]    Recent Results (from the past 240 hour(s))  Microscopic Examination     Status: Abnormal   Collection Time: 01/09/20  1:46 PM   Urine  Result Value Ref Range Status   WBC, UA 0-5 0 - 5 /hpf Final   RBC >30 (A) 0 - 2 /hpf Final   Epithelial Cells (non renal) >10 (A) 0 - 10 /hpf Final   Renal Epithel, UA None seen None seen /hpf Final   Bacteria, UA Few None seen/Few Final  Resp Panel by RT PCR (RSV, Flu A&B, Covid) - Nasopharyngeal Swab     Status: None   Collection Time: 01/10/20 11:54 AM   Specimen: Nasopharyngeal Swab  Result Value Ref Range Status   SARS Coronavirus 2 by RT PCR NEGATIVE NEGATIVE Final    Comment: (NOTE) SARS-CoV-2 target nucleic acids are NOT DETECTED.  The SARS-CoV-2 RNA is generally detectable in upper respiratoy specimens during the acute phase of infection. The lowest concentration of SARS-CoV-2 viral copies this assay can detect is 131 copies/mL. A negative result does not preclude SARS-Cov-2 infection and should not be used as the sole basis for treatment or other patient management decisions. A negative result may occur with  improper specimen collection/handling, submission of specimen other than nasopharyngeal swab, presence of viral mutation(s) within the areas targeted by this assay, and inadequate number of viral copies (<131 copies/mL). A negative result must be combined with clinical observations, patient history, and epidemiological information. The expected result is Negative.  Fact Sheet for Patients:  https://www.moore.com/  Fact Sheet for Healthcare Providers:  https://www.young.biz/  This test is no t yet approved or cleared by the [place] FDA and  has been authorized for detection and/or diagnosis of SARS-CoV-2 by FDA under an Emergency Use Authorization (EUA). This EUA will remain  in effect (meaning this test can be used) for the  duration of the COVID-19 declaration under Section 564(b)(1) of the Act, 21 U.S.C. section 360bbb-3(b)(1), unless the authorization is terminated or revoked sooner.     Influenza A by PCR NEGATIVE NEGATIVE Final   Influenza B by PCR NEGATIVE NEGATIVE Final    Comment: (NOTE) The Xpert Xpress SARS-CoV-2/FLU/RSV assay is intended as an aid in  the diagnosis of influenza from Nasopharyngeal swab specimens and  should not be used as a sole basis for treatment. Nasal washings and  aspirates are unacceptable for Xpert Xpress SARS-CoV-2/FLU/RSV  testing.  Fact Sheet for Patients: https://www.moore.com/  Fact Sheet for Healthcare Providers: https://www.young.biz/  This test is not yet approved or cleared by the [place] FDA and  has been authorized for detection and/or diagnosis of SARS-CoV-2 by  FDA under an Emergency Use Authorization (EUA). This EUA will remain  in effect (meaning this test can be used) for the duration of the  Covid-19 declaration under Section 564(b)(1) of the Act, 21  U.S.C. section 360bbb-3(b)(1), unless the authorization is  terminated or revoked.    Respiratory Syncytial Virus by PCR NEGATIVE NEGATIVE Final    Comment: (NOTE) Fact Sheet for Patients: https://www.moore.com/  Fact Sheet for Healthcare Providers: https://www.young.biz/  This test is not yet approved or cleared by  the [employer] and  has been authorized for detection and/or diagnosis of SARS-CoV-2 by  FDA under an Emergency Use Authorization (EUA). This EUA will remain  in effect (meaning this test can be used) for the duration of the  COVID-19 declaration under Section 564(b)(1) of the Act, 21 U.S.C.  section 360bbb-3(b)(1), unless the authorization is terminated or  revoked. Performed at [facility], [address] [place], [place] [address]    Creatinine: Recent Labs    01/10/20 1007  CREATININE  0.71   Baseline Creatinine: 0.7  Impression/Assessment:  39yo with left ureterla stoen and UTI  Plan:  -We discussed the management of kidney stones. These options include observation, ureteroscopy, shockwave lithotripsy (ESWL) and percutaneous nephrolithotomy (PCNL). We discussed which options are relevant to the patient's stone(s). We discussed the natural history of kidney stones as well as the complications of untreated stones and the impact on quality of life without treatment as well as with each of the above listed treatments. We also discussed the efficacy of each treatment in its ability to clear the stone burden. With any of these management options I discussed the signs and symptoms of infection and the need for emergent treatment should these be experienced. For each option we discussed the ability of each procedure to clear the patient of their stone burden.   For observation I described the risks which include but are not limited to silent renal damage, life-threatening infection, need for emergent surgery, failure to pass stone and pain.   For ureteroscopy I described the risks which include bleeding, infection, damage to contiguous structures, positioning injury, ureteral stricture, ureteral avulsion, ureteral injury, need for prolonged ureteral stent, inability to perform ureteroscopy, need for an interval procedure, inability to clear stone burden, stent discomfort/pain, heart attack, stroke, pulmonary embolus and the inherent risks with general anesthesia.   For shockwave lithotripsy I described the risks which include arrhythmia, kidney contusion, kidney hemorrhage, need for transfusion, pain, inability to adequately break up stone, inability to pass stone fragments, Steinstrasse, infection associated with obstructing stones, need for alternate surgical procedure, need for repeat shockwave lithotripsy, MI, CVA, PE and the inherent risks with anesthesia/conscious sedation.   For  PCNL I described the risks including positioning injury, pneumothorax, hydrothorax, need for chest tube, inability to clear stone burden, renal laceration, arterial venous fistula or malformation, need for embolization of kidney, loss of kidney or renal function, need for repeat procedure, need for prolonged nephrostomy tube, ureteral avulsion, MI, CVA, PE and the inherent risks of general anesthesia.   - The patient would like to proceed with Left ureteral stent placement. Please make her NPO after midnight for procedure tomorrow morning  Wilkie Aye 01/10/2020, 4:41 PM

## 2020-01-11 ENCOUNTER — Inpatient Hospital Stay (HOSPITAL_COMMUNITY): Payer: BC Managed Care – PPO

## 2020-01-11 ENCOUNTER — Encounter (HOSPITAL_COMMUNITY)
Admission: EM | Disposition: A | Discharge status: Home / Self Care | Payer: Self-pay | Source: Home / Self Care | Attending: Internal Medicine

## 2020-01-11 ENCOUNTER — Ambulatory Visit: Payer: BC Managed Care – PPO | Admitting: Adult Health

## 2020-01-11 ENCOUNTER — Inpatient Hospital Stay (HOSPITAL_COMMUNITY): Payer: BC Managed Care – PPO | Admitting: Anesthesiology

## 2020-01-11 ENCOUNTER — Encounter (HOSPITAL_COMMUNITY): Payer: Self-pay | Admitting: Internal Medicine

## 2020-01-11 ENCOUNTER — Ambulatory Visit: Admission: RE | Admit: 2020-01-11 | Payer: BC Managed Care – PPO | Source: Home / Self Care | Admitting: Urology

## 2020-01-11 DIAGNOSIS — N201 Calculus of ureter: Secondary | ICD-10-CM

## 2020-01-11 HISTORY — PX: HOLMIUM LASER APPLICATION: SHX5852

## 2020-01-11 HISTORY — PX: CYSTOSCOPY W/ URETERAL STENT PLACEMENT: SHX1429

## 2020-01-11 LAB — BASIC METABOLIC PANEL
Anion gap: 9 (ref 5–15)
BUN: 7 mg/dL (ref 6–20)
CO2: 27 mmol/L (ref 22–32)
Calcium: 8.3 mg/dL — ABNORMAL LOW (ref 8.9–10.3)
Chloride: 103 mmol/L (ref 98–111)
Creatinine, Ser: 0.69 mg/dL (ref 0.44–1.00)
GFR, Estimated: >60 mL/min (ref >60)
Glucose, Bld: 100 mg/dL — ABNORMAL HIGH (ref 70–99)
Potassium: 3.6 mmol/L (ref 3.5–5.1)
Sodium: 139 mmol/L (ref 135–145)

## 2020-01-11 LAB — URINE CULTURE: Culture: NO GROWTH

## 2020-01-11 LAB — CBC
HCT: 38.7 % (ref 36.0–46.0)
Hemoglobin: 12 g/dL (ref 12.0–15.0)
MCH: 26.8 pg (ref 26.0–34.0)
MCHC: 31 g/dL (ref 30.0–36.0)
MCV: 86.6 fL (ref 80.0–100.0)
Platelets: 316 10*3/uL (ref 150–400)
RBC: 4.47 MIL/uL (ref 3.87–5.11)
RDW: 19.6 % — ABNORMAL HIGH (ref 11.5–15.5)
WBC: 7.2 10*3/uL (ref 4.0–10.5)
nRBC: 0 % (ref 0.0–0.2)

## 2020-01-11 LAB — MAGNESIUM: Magnesium: 1.8 mg/dL (ref 1.7–2.4)

## 2020-01-11 SURGERY — CYSTOSCOPY, WITH RETROGRADE PYELOGRAM AND URETERAL STENT INSERTION
Anesthesia: General | Laterality: Left

## 2020-01-11 MED ORDER — FENTANYL CITRATE (PF) 100 MCG/2ML IJ SOLN
INTRAMUSCULAR | Status: DC | PRN
  Administered 2020-01-11: 100 ug via INTRAVENOUS

## 2020-01-11 MED ORDER — PROPOFOL 10 MG/ML IV BOLUS
INTRAVENOUS | Status: AC
  Filled 2020-01-11: qty 20

## 2020-01-11 MED ORDER — DEXAMETHASONE SODIUM PHOSPHATE 4 MG/ML IJ SOLN
INTRAMUSCULAR | Status: DC | PRN
  Administered 2020-01-11: 4 mg via INTRAVENOUS

## 2020-01-11 MED ORDER — LIDOCAINE HCL (CARDIAC) PF 100 MG/5ML IV SOSY
PREFILLED_SYRINGE | INTRAVENOUS | Status: DC | PRN
  Administered 2020-01-11: 100 mg via INTRAVENOUS

## 2020-01-11 MED ORDER — SODIUM CHLORIDE 0.9 % IR SOLN
Status: DC | PRN
  Administered 2020-01-11: 3000 mL

## 2020-01-11 MED ORDER — KETOROLAC TROMETHAMINE 10 MG PO TABS: 10.0000 mg | ORAL_TABLET | Freq: Four times a day (QID) | ORAL | 0 refills | Status: DC | PRN

## 2020-01-11 MED ORDER — DEXAMETHASONE SODIUM PHOSPHATE 10 MG/ML IJ SOLN
INTRAMUSCULAR | Status: AC
  Filled 2020-01-11: qty 1

## 2020-01-11 MED ORDER — PROPOFOL 10 MG/ML IV BOLUS
INTRAVENOUS | Status: DC | PRN
  Administered 2020-01-11: 200 mg via INTRAVENOUS

## 2020-01-11 MED ORDER — DIATRIZOATE MEGLUMINE 30 % UR SOLN
URETHRAL | Status: AC
  Filled 2020-01-11: qty 100

## 2020-01-11 MED ORDER — MEPERIDINE HCL 50 MG/ML IJ SOLN: 6.2500 mg | INTRAMUSCULAR | Status: DC | PRN

## 2020-01-11 MED ORDER — SULFAMETHOXAZOLE-TRIMETHOPRIM 800-160 MG PO TABS: 1.0000 | ORAL_TABLET | Freq: Two times a day (BID) | ORAL | 0 refills | Status: AC

## 2020-01-11 MED ORDER — STERILE WATER FOR IRRIGATION IR SOLN
Status: DC | PRN
  Administered 2020-01-11: 500 mL

## 2020-01-11 MED ORDER — HYDROMORPHONE HCL 1 MG/ML IJ SOLN
0.2500 mg | INTRAMUSCULAR | Status: DC | PRN
  Administered 2020-01-11 (×2): 0.5 mg via INTRAVENOUS
  Filled 2020-01-11: qty 0.5

## 2020-01-11 MED ORDER — LACTATED RINGERS IV SOLN: INTRAVENOUS | Status: DC | PRN

## 2020-01-11 MED ORDER — FENTANYL CITRATE (PF) 100 MCG/2ML IJ SOLN
INTRAMUSCULAR | Status: AC
  Filled 2020-01-11: qty 2

## 2020-01-11 MED ORDER — FENTANYL CITRATE (PF) 100 MCG/2ML IJ SOLN
50.0000 ug | INTRAMUSCULAR | Status: AC | PRN
  Administered 2020-01-11 (×2): 50 ug via INTRAVENOUS

## 2020-01-11 MED ORDER — ONDANSETRON HCL 4 MG/2ML IJ SOLN
INTRAMUSCULAR | Status: AC
  Filled 2020-01-11: qty 2

## 2020-01-11 MED ORDER — CHLORHEXIDINE GLUCONATE 0.12 % MT SOLN: 15.0000 mL | Freq: Once | OROMUCOSAL | Status: DC

## 2020-01-11 MED ORDER — SODIUM CHLORIDE 0.9 % IV SOLN: 2.0000 g | INTRAVENOUS | Status: DC

## 2020-01-11 MED ORDER — DIATRIZOATE MEGLUMINE 30 % UR SOLN
URETHRAL | Status: DC | PRN
  Administered 2020-01-11: 100 mL via URETHRAL

## 2020-01-11 MED ORDER — LIDOCAINE 2% (20 MG/ML) 5 ML SYRINGE
INTRAMUSCULAR | Status: AC
  Filled 2020-01-11: qty 5

## 2020-01-11 MED ORDER — ONDANSETRON HCL 4 MG/2ML IJ SOLN
INTRAMUSCULAR | Status: DC | PRN
  Administered 2020-01-11: 4 mg via INTRAVENOUS

## 2020-01-11 MED ORDER — LACTATED RINGERS IV SOLN
Freq: Once | INTRAVENOUS | Status: AC
  Administered 2020-01-11: 1000 mL via INTRAVENOUS

## 2020-01-11 MED ORDER — PROMETHAZINE HCL 25 MG/ML IJ SOLN: 6.2500 mg | INTRAMUSCULAR | Status: DC | PRN

## 2020-01-11 MED ORDER — ONDANSETRON HCL 4 MG PO TABS: 4.0000 mg | ORAL_TABLET | Freq: Four times a day (QID) | ORAL | 0 refills | Status: DC | PRN

## 2020-01-11 MED ORDER — MIDAZOLAM HCL 5 MG/5ML IJ SOLN
INTRAMUSCULAR | Status: DC | PRN
  Administered 2020-01-11: 2 mg via INTRAVENOUS

## 2020-01-11 MED ORDER — ORAL CARE MOUTH RINSE: 15.0000 mL | Freq: Once | OROMUCOSAL | Status: DC

## 2020-01-11 MED ORDER — MIDAZOLAM HCL 2 MG/2ML IJ SOLN
INTRAMUSCULAR | Status: AC
  Filled 2020-01-11: qty 2

## 2020-01-11 SURGICAL SUPPLY — 26 items
BAG DRAIN URO TABLE W/ADPT NS (BAG) ×3 IMPLANT
BAG HAMPER (MISCELLANEOUS) ×3 IMPLANT
CATH INTERMIT  6FR 70CM (CATHETERS) ×3 IMPLANT
CLOTH BEACON ORANGE TIMEOUT ST (SAFETY) ×3 IMPLANT
DECANTER SPIKE VIAL GLASS SM (MISCELLANEOUS) ×3 IMPLANT
EXTRACTOR STONE NITINOL NGAGE (UROLOGICAL SUPPLIES) ×2 IMPLANT
FIBER LASER FLEXIVA 200 (UROLOGICAL SUPPLIES) ×2 IMPLANT
GLOVE BIO SURGEON STRL SZ8 (GLOVE) ×3 IMPLANT
GLOVE BIOGEL PI IND STRL 7.0 (GLOVE) ×2 IMPLANT
GLOVE BIOGEL PI INDICATOR 7.0 (GLOVE) ×4
GOWN STRL REUS W/ TWL XL LVL3 (GOWN DISPOSABLE) IMPLANT
GOWN STRL REUS W/TWL LRG LVL3 (GOWN DISPOSABLE) ×3 IMPLANT
GOWN STRL REUS W/TWL XL LVL3 (GOWN DISPOSABLE) ×7 IMPLANT
GUIDEWIRE STR ZIPWIRE 035X150 (MISCELLANEOUS) ×3 IMPLANT
IV NS IRRIG 3000ML ARTHROMATIC (IV SOLUTION) ×3 IMPLANT
KIT TURNOVER CYSTO (KITS) ×3 IMPLANT
MANIFOLD NEPTUNE II (INSTRUMENTS) ×3 IMPLANT
PACK CYSTO (CUSTOM PROCEDURE TRAY) ×3 IMPLANT
PAD ARMBOARD 7.5X6 YLW CONV (MISCELLANEOUS) ×3 IMPLANT
STENT URET 6FRX24 CONTOUR (STENTS) ×2 IMPLANT
STENT URET 6FRX26 CONTOUR (STENTS) IMPLANT
SYR 10ML LL (SYRINGE) ×3 IMPLANT
TOWEL OR 17X26 4PK STRL BLUE (TOWEL DISPOSABLE) ×3 IMPLANT
TRACTIP FLEXIVA PULS ID 200XHI (Laser) ×1 IMPLANT
TRACTIP FLEXIVA PULSE ID 200 (Laser) ×2
WATER STERILE IRR 500ML POUR (IV SOLUTION) ×3 IMPLANT

## 2020-01-11 NOTE — Progress Notes (Signed)
PROGRESS NOTE    [name]  JSE:[id number] DOB: 03-04-1980 DOA: 01/10/2020 PCP: Patient, [name]   Brief Narrative:  Per HPI: [name] is a 39 y.o. female with medical history significant for recurrent UTIs, hypertension, and iron deficiency anemia who presented to the ED with sudden onset left-sided flank pain that was sharp in nature with no radiation that began at approximately 4 AM.  She describes increased recent urinary frequency and some darker than normal urine as she has been using Azo for symptomatic management, but has not been getting relief.  She was seen by her urologist Dr. Retta Diones yesterday and was started on treatment with Macrobid for UTI with plans for renal ultrasound in the coming days.  She denies any fevers, chills, or vomiting, but does have some nausea.  She denies any hematuria.  No other complaints of chest pain, shortness of breath, headaches, or diarrhea noted.  -Patient plan for left ureteral stent placement with cystoscopy on 11/18.  Assessment & Plan:   Active Problems:   Hydroureteronephrosis   Left-sided hydroureteronephrosis secondary to infected nephrolithiasis -Plan for left ureteral stent placement per urology today -Pain management with Toradol as needed -Zofran as needed for nausea or vomiting -Resume diet after procedure  Recurrent UTI secondary to above -Patient was recently started on Macrobid by urology -Continue on IV Rocephin while here and check urine culture-still pending  History of hypertension-controlled -Continue on home HCTZ and monitor blood pressure readings  History of iron deficiency anemia -Currently stable, follow CBC in a.m. -May resume iron supplementation at home once discharged  Obesity -Will need lifestyle changes in outpatient setting   DVT prophylaxis: SCDs Code Status: Full code Family Communication: None at bedside, patient will call Disposition Plan:  Status is:  Inpatient  Remains inpatient appropriate because:Ongoing active pain requiring inpatient pain management and IV treatments appropriate due to intensity of illness or inability to take PO   Dispo: The patient is from: Home              Anticipated d/c is to: Home              Anticipated d/c date is: 1 day              Patient currently is not medically stable to d/c.  Patient requires cystoscopy and stent placement and close monitoring overnight after procedure.   Consultants:   Urology  Procedures:   See below  Antimicrobials:  Anti-infectives (From admission, onward)   Start     Dose/Rate Route Frequency Ordered Stop   01/11/20 1130  cefTRIAXone (ROCEPHIN) 2 g in sodium chloride 0.9 % 100 mL IVPB  Status:  Discontinued        2 g 200 mL/hr over 30 Minutes Intravenous 30 min pre-op 01/11/20 1032 01/11/20 1211   01/11/20 0000  [MAR Hold]  cefTRIAXone (ROCEPHIN) 1 g in sodium chloride 0.9 % 100 mL IVPB        (MAR Hold since Thu 01/11/2020 at 1022.Hold Reason: Transfer to a Procedural area.)   1 g 200 mL/hr over 30 Minutes Intravenous Every 24 hours 01/10/20 1326     01/10/20 1115  cefTRIAXone (ROCEPHIN) 1 g in sodium chloride 0.9 % 100 mL IVPB        1 g 200 mL/hr over 30 Minutes Intravenous  Once 01/10/20 1110 01/10/20 1208   01/10/20 0930  nitrofurantoin (macrocrystal-monohydrate) (MACROBID) capsule 100 mg        100 mg Oral  Once 01/10/20 0926 01/10/20 0941       Subjective: Patient seen and evaluated today with no new acute complaints or concerns. No acute concerns or events noted overnight.  She denies any significant nausea or abdominal pain.  Objective: Vitals:   01/11/20 0102 01/11/20 0611 01/11/20 0900 01/11/20 1027  BP: (!) 154/82 (!) 141/78 (!) 147/86 (!) 180/83  Pulse: 64 76 69 74  Resp: 19 19 18 18   Temp: 98.8 F (37.1 C) 98.5 F (36.9 C) 98.9 F (37.2 C) 98.9 F (37.2 C)  TempSrc: Oral Oral Oral Oral  SpO2: 98% 98% 98% 97%  Weight:      Height:         Intake/Output Summary (Last 24 hours) at 01/11/2020 1212 Last data filed at 01/11/2020 0500 Gross per 24 hour  Intake 204.67 ml  Output --  Net 204.67 ml   Filed Weights   01/10/20 0834  Weight: 81.6 kg    Examination:  General exam: Appears calm and comfortable, obese Respiratory system: Clear to auscultation. Respiratory effort normal. Cardiovascular system: S1 & S2 heard, RRR. Gastrointestinal system: Abdomen is nondistended, soft and nontender.  Central nervous system: Alert and awake Extremities: No edema Skin: No rashes, lesions or ulcers Psychiatry: Judgement and insight appear normal. Mood & affect appropriate.     Data Reviewed: I have personally reviewed following labs and imaging studies  CBC: Recent Labs  Lab 01/10/20 1007 01/11/20 0342  WBC 12.5* 7.2  NEUTROABS 11.0*  --   HGB 12.7 12.0  HCT 40.9 38.7  MCV 86.7 86.6  PLT 364 316   Basic Metabolic Panel: Recent Labs  Lab 01/10/20 1007 01/11/20 0342  NA 140 139  K 3.7 3.6  CL 104 103  CO2 27 27  GLUCOSE 108* 100*  BUN 9 7  CREATININE 0.71 0.69  CALCIUM 8.5* 8.3*  MG  --  1.8   GFR: Estimated Creatinine Clearance: 89.3 mL/min (by C-G formula based on SCr of 0.69 mg/dL). Liver Function Tests: Recent Labs  Lab 01/10/20 1007  AST 21  ALT 15  ALKPHOS 39  BILITOT 0.5  PROT 6.7  ALBUMIN 3.8   No results for input(s): LIPASE, AMYLASE in the last 168 hours. No results for input(s): AMMONIA in the last 168 hours. Coagulation Profile: No results for input(s): INR, PROTIME in the last 168 hours. Cardiac Enzymes: No results for input(s): CKTOTAL, CKMB, CKMBINDEX, TROPONINI in the last 168 hours. BNP (last 3 results) No results for input(s): PROBNP in the last 8760 hours. HbA1C: No results for input(s): HGBA1C in the last 72 hours. CBG: No results for input(s): GLUCAP in the last 168 hours. Lipid Profile: No results for input(s): CHOL, HDL, LDLCALC, TRIG, CHOLHDL, LDLDIRECT in the  last 72 hours. Thyroid Function Tests: No results for input(s): TSH, T4TOTAL, FREET4, T3FREE, THYROIDAB in the last 72 hours. Anemia Panel: No results for input(s): VITAMINB12, FOLATE, FERRITIN, TIBC, IRON, RETICCTPCT in the last 72 hours. Sepsis Labs: No results for input(s): PROCALCITON, LATICACIDVEN in the last 168 hours.  Recent Results (from the past 240 hour(s))  Microscopic Examination     Status: Abnormal   Collection Time: 01/09/20  1:46 PM   Urine  Result Value Ref Range Status   WBC, UA 0-5 0 - 5 /hpf Final   RBC >30 (A) 0 - 2 /hpf Final   Epithelial Cells (non renal) >10 (A) 0 - 10 /hpf Final   Renal Epithel, UA None seen None seen /hpf Final  Bacteria, UA Few None seen/Few Final  Resp Panel by RT PCR (RSV, Flu A&B, Covid) - Nasopharyngeal Swab     Status: None   Collection Time: 01/10/20 11:54 AM   Specimen: Nasopharyngeal Swab  Result Value Ref Range Status   SARS Coronavirus 2 by RT PCR NEGATIVE NEGATIVE Final    Comment: (NOTE) SARS-CoV-2 target nucleic acids are NOT DETECTED.  The SARS-CoV-2 RNA is generally detectable in upper respiratoy specimens during the acute phase of infection. The lowest concentration of SARS-CoV-2 viral copies this assay can detect is 131 copies/mL. A negative result does not preclude SARS-Cov-2 infection and should not be used as the sole basis for treatment or other patient management decisions. A negative result may occur with  improper specimen collection/handling, submission of specimen other than nasopharyngeal swab, presence of viral mutation(s) within the areas targeted by this assay, and inadequate number of viral copies (<131 copies/mL). A negative result must be combined with clinical observations, patient history, and epidemiological information. The expected result is Negative.  Fact Sheet for Patients:  https://www.moore.com/  Fact Sheet for Healthcare Providers:   https://www.young.biz/  This test is no t yet approved or cleared by the [place] FDA and  has been authorized for detection and/or diagnosis of SARS-CoV-2 by FDA under an Emergency Use Authorization (EUA). This EUA will remain  in effect (meaning this test can be used) for the duration of the COVID-19 declaration under Section 564(b)(1) of the Act, 21 U.S.C. section 360bbb-3(b)(1), unless the authorization is terminated or revoked sooner.     Influenza A by PCR NEGATIVE NEGATIVE Final   Influenza B by PCR NEGATIVE NEGATIVE Final    Comment: (NOTE) The Xpert Xpress SARS-CoV-2/FLU/RSV assay is intended as an aid in  the diagnosis of influenza from Nasopharyngeal swab specimens and  should not be used as a sole basis for treatment. Nasal washings and  aspirates are unacceptable for Xpert Xpress SARS-CoV-2/FLU/RSV  testing.  Fact Sheet for Patients: https://www.moore.com/  Fact Sheet for Healthcare Providers: https://www.young.biz/  This test is not yet approved or cleared by the [place] FDA and  has been authorized for detection and/or diagnosis of SARS-CoV-2 by  FDA under an Emergency Use Authorization (EUA). This EUA will remain  in effect (meaning this test can be used) for the duration of the  Covid-19 declaration under Section 564(b)(1) of the Act, 21  U.S.C. section 360bbb-3(b)(1), unless the authorization is  terminated or revoked.    Respiratory Syncytial Virus by PCR NEGATIVE NEGATIVE Final    Comment: (NOTE) Fact Sheet for Patients: https://www.moore.com/  Fact Sheet for Healthcare Providers: https://www.young.biz/  This test is not yet approved or cleared by the [place] FDA and  has been authorized for detection and/or diagnosis of SARS-CoV-2 by  FDA under an Emergency Use Authorization (EUA). This EUA will remain  in effect (meaning this test can be  used) for the duration of the  COVID-19 declaration under Section 564(b)(1) of the Act, 21 U.S.C.  section 360bbb-3(b)(1), unless the authorization is terminated or  revoked. Performed at [facility], [address] [place], [place] [address]          Radiology Studies: CT Renal Stone Study  Result Date: 01/10/2020 CLINICAL DATA:  Left flank pain and nausea for 6 days EXAM: CT ABDOMEN AND PELVIS WITHOUT CONTRAST TECHNIQUE: Multidetector CT imaging of the abdomen and pelvis was performed following the standard protocol without IV contrast. COMPARISON:  08/01/2015 FINDINGS: Lower chest: Mild left basilar atelectasis.  Heart size is normal. Hepatobiliary: Unremarkable unenhanced appearance of the liver. No focal liver lesion identified. Gallbladder within normal limits. Two peripherally calcified stones within the gallbladder lumen each measuring approximately 1.9 cm. No pericholecystic inflammatory changes by CT. No biliary dilatation. Pancreas: Unremarkable. No pancreatic ductal dilatation or surrounding inflammatory changes. Spleen: Normal in size without focal abnormality. Adrenals/Urinary Tract: 6 x 4 mm stone located at the left ureterovesical junction results in moderate left hydroureteronephrosis with left perinephric stranding. No left renal stone. Right kidney and ureter are normal in appearance. Urinary bladder is partially decompressed. Unremarkable adrenal glands. Stomach/Bowel: Stomach is within normal limits. Appendix appears normal (series 6, image 64). No evidence of bowel wall thickening, distention, or inflammatory changes. Vascular/Lymphatic: No significant vascular findings are evident on non contrasted study. No enlarged abdominal or pelvic lymph nodes. Reproductive: Uterus and bilateral adnexa are unremarkable. Other: No free fluid. No abdominopelvic fluid collection. No pneumoperitoneum. No abdominal wall hernia. Musculoskeletal: No acute or significant osseous findings.  IMPRESSION: 1. Obstructing 6 x 4 mm stone at the left UVJ results in moderate left hydroureteronephrosis. 2. Cholelithiasis without evidence of acute cholecystitis. Electronically Signed   By: Duanne GuessNicholas  Plundo D.O.   On: 01/10/2020 10:10        Scheduled Meds: . chlorhexidine  15 mL Mouth/Throat Once   Or  . mouth rinse  15 mL Mouth Rinse Once  . [MAR Hold] hydrochlorothiazide  12.5 mg Oral Daily   Continuous Infusions: . [MAR Hold] cefTRIAXone (ROCEPHIN)  IV 1 g (01/11/20 0018)     LOS: 1 day    Time spent: 30 minutes    Emarie Paul D Teron Blais, DO [facility]  If 7PM-7AM, please contact night-coverage 01/11/2020, 12:12 PM

## 2020-01-11 NOTE — Interval H&P Note (Signed)
History and Physical Interval Note:  01/11/2020 12:19 PM  [name]  has presented today for surgery, with the diagnosis of left ureteral calculus.  The various methods of treatment have been discussed with the patient and family. After consideration of risks, benefits and other options for treatment, the patient has consented to  Procedure(s): CYSTOSCOPY WITH RETROGRADE PYELOGRAM/URETERAL STENT PLACEMENT (Left) HOLMIUM LASER APPLICATION- possible ureteroscopy per Dr. Ronne Binning have available if needed (Left) as a surgical intervention.  The patient's history has been reviewed, patient examined, no change in status, stable for surgery.  I have reviewed the patient's chart and labs.  Questions were answered to the patient's satisfaction.     Wilkie Aye

## 2020-01-11 NOTE — Op Note (Signed)
.  Preoperative diagnosis: Left ureteral stone  Postoperative diagnosis: Same  Procedure: 1 cystoscopy 2. Left retrograde pyelography 3.  Intraoperative fluoroscopy, under one hour, with interpretation 4.  Left ureteroscopic stone manipulation with laser lithotripsy 5.  Left 6 x 24 JJ stent placement  Attending: Cleda Mccreedy  Anesthesia: General  Estimated blood loss: None  Drains: Left 6 x 26 JJ ureteral stent without tether  Specimens: stone for analysis  Antibiotics: rocephin  Findings: left distal ureteral stone. Moderate hydronephrosis. No masses/lesions in the bladder. Ureteral orifices in normal anatomic location.  Indications: Patient is a 39 year old female with a history of left ureteral stone and who has persistent left flank pain.  After discussing treatment options, she decided proceed with left ureteroscopic stone manipulation.  Procedure her in detail: The patient was brought to the operating room and a brief timeout was done to ensure correct patient, correct procedure, correct site.  General anesthesia was administered patient was placed in dorsal lithotomy position.  Her genitalia was then prepped and draped in usual sterile fashion.  A rigid 22 French cystoscope was passed in the urethra and the bladder.  Bladder was inspected free masses or lesions.  the ureteral orifices were in the normal orthotopic locations.  a 6 french ureteral catheter was then instilled into the left ureteral orifice.  a gentle retrograde was obtained and findings noted above.  we then placed a zip wire through the ureteral catheter and advanced up to the renal pelvis.  we then removed the cystoscope and cannulated the left ureteral orifice with a semirigid ureteroscope.  We encountered a stone in the distal ureter. using a 200 nm laser fiber and fragmented the stone into smaller pieces.  the pieces were then removed with a engage basket. once all stone fragments were removed we then placed a 6  x 24 double-j ureteral stent over the original zip wire.   We then removed the wire and good coil was noted in the the renal pelvis under fluoroscopy and the bladder under direct vision.    the stone fragments were then removed from the bladder and sent for analysis.   the bladder was then drained and this concluded the procedure which was well tolerated by patient.  Complications: None  Condition: Stable, extubated, transferred to PACU  Plan: Patient is to be discharged home as to follow-up in one week for stent removal.

## 2020-01-11 NOTE — Anesthesia Procedure Notes (Signed)
Procedure Name: LMA Insertion Performed by: Veta Dambrosia L, CRNA Pre-anesthesia Checklist: Patient identified, Emergency Drugs available, Suction available, Patient being monitored and Timeout performed Patient Re-evaluated:Patient Re-evaluated prior to induction Oxygen Delivery Method: Circle system utilized Preoxygenation: Pre-oxygenation with 100% oxygen Induction Type: IV induction LMA: LMA inserted LMA Size: 4.0 Number of attempts: 1 Placement Confirmation: positive ETCO2,  CO2 detector and breath sounds checked- equal and bilateral Tube secured with: Tape Dental Injury: Teeth and Oropharynx as per pre-operative assessment        

## 2020-01-11 NOTE — Discharge Summary (Signed)
Physician Discharge Summary  [name] FGH:[id number] DOB: 1980/04/24 DOA: 01/10/2020  PCP: Patient, [name]  Admit date: 01/10/2020  Discharge date: 01/11/2020  Admitted From:Home  Disposition:  Home  Recommendations for Outpatient Follow-up:  1. Follow up with PCP in 1-2 weeks 2. Follow-up with urology as noted in 1 week for stent removal 3. Continue on Bactrim DS twice daily for 7 days 4. Pain management with Toradol prescribed as needed for discomfort 5. Zofran prescribed as needed for any nausea or vomiting  Home Health: None  Equipment/Devices: None  Discharge Condition: Stable  CODE STATUS: Full  Diet recommendation: Regular  Brief/Interim Summary: Per HPI: Charlene Knightis a 39 y.o.femalewith medical history significant forrecurrent UTIs, hypertension, and iron deficiency anemia who presented to the ED with sudden onset left-sided flank pain that was sharp in nature with no radiation that began at approximately 4 AM. She describes increased recent urinary frequency and some darker than normal urine as she has been using Azo for symptomatic management, but has not been getting relief. She was seen by her urologist Dr. Retta Diones yesterday and was started on treatment with Macrobid for UTI with plans for renal ultrasound in the coming days. She denies any fevers, chills, or vomiting, but does have some nausea. She denies any hematuria. No other complaints of chest pain, shortness of breath, headaches, or diarrhea noted.  -Patient has undergone cystoscopy with left ureteral stent placement on 11/18 with urology.  Plans are for removal of the stent in the next 1 week and follow-up will be arranged by urology.  She is to remain on Bactrim DS twice daily for the next 7 days and will be given some pain medications as needed as well.  She is otherwise stable for discharge from urology standpoint and has no other complaints or concerns noted at this time.  No other  acute events have been noted throughout the course of this brief admission.  Discharge Diagnoses:  Active Problems:   Hydroureteronephrosis  Principal discharge diagnosis: Left sided moderate hydroureteronephrosis secondary to infected nephrolithiasis.  Discharge Instructions  Discharge Instructions    Diet - low sodium heart healthy   Complete by: As directed    If the dressing is still on your incision site when you go home, remove it on the third day after your surgery date. Remove dressing if it begins to fall off, or if it is dirty or damaged before the third day.   Complete by: As directed    Increase activity slowly   Complete by: As directed      Allergies as of 01/11/2020      Reactions   Augmentin [amoxicillin-pot Clavulanate] Other (See Comments)   Abdominal Pain      Medication List    STOP taking these medications   ciprofloxacin 500 MG tablet Commonly known as: CIPRO   nitrofurantoin (macrocrystal-monohydrate) 100 MG capsule Commonly known as: Macrobid     TAKE these medications   ferrous sulfate 324 MG Tbec Take 324 mg by mouth.   hydrochlorothiazide 12.5 MG capsule Commonly known as: MICROZIDE Take 1 capsule (12.5 mg total) by mouth daily.   ketorolac 10 MG tablet Commonly known as: TORADOL Take 1 tablet (10 mg total) by mouth every 6 (six) hours as needed.   multivitamin tablet Take 1 tablet by mouth daily.   ondansetron 4 MG tablet Commonly known as: ZOFRAN Take 1 tablet (4 mg total) by mouth every 6 (six) hours as needed for nausea.   sulfamethoxazole-trimethoprim  800-160 MG tablet Commonly known as: BACTRIM DS Take 1 tablet by mouth 2 (two) times daily for 7 days.            Discharge Care Instructions  (From admission, onward)         Start     Ordered   01/11/20 0000  If the dressing is still on your incision site when you go home, remove it on the third day after your surgery date. Remove dressing if it begins to fall off, or  if it is dirty or damaged before the third day.        01/11/20 1512          Follow-up Information    [facility] Follow up in 1 week(s).   Contact information: [address] [place] [address] [phone]             Allergies  Allergen Reactions  . Augmentin [Amoxicillin-Pot Clavulanate] Other (See Comments)    Abdominal Pain    Consultations:  Urology   Procedures/Studies: DG C-Arm 1-60 Min-No Report  Result Date: 01/11/2020 Fluoroscopy was utilized by the requesting physician.  No radiographic interpretation.   CT Renal Stone Study  Result Date: 01/10/2020 CLINICAL DATA:  Left flank pain and nausea for 6 days EXAM: CT ABDOMEN AND PELVIS WITHOUT CONTRAST TECHNIQUE: Multidetector CT imaging of the abdomen and pelvis was performed following the standard protocol without IV contrast. COMPARISON:  08/01/2015 FINDINGS: Lower chest: Mild left basilar atelectasis.  Heart size is normal. Hepatobiliary: Unremarkable unenhanced appearance of the liver. No focal liver lesion identified. Gallbladder within normal limits. Two peripherally calcified stones within the gallbladder lumen each measuring approximately 1.9 cm. No pericholecystic inflammatory changes by CT. No biliary dilatation. Pancreas: Unremarkable. No pancreatic ductal dilatation or surrounding inflammatory changes. Spleen: Normal in size without focal abnormality. Adrenals/Urinary Tract: 6 x 4 mm stone located at the left ureterovesical junction results in moderate left hydroureteronephrosis with left perinephric stranding. No left renal stone. Right kidney and ureter are normal in appearance. Urinary bladder is partially decompressed. Unremarkable adrenal glands. Stomach/Bowel: Stomach is within normal limits. Appendix appears normal (series 6, image 64). No evidence of bowel wall thickening, distention, or inflammatory changes. Vascular/Lymphatic: No significant  vascular findings are evident on non contrasted study. No enlarged abdominal or pelvic lymph nodes. Reproductive: Uterus and bilateral adnexa are unremarkable. Other: No free fluid. No abdominopelvic fluid collection. No pneumoperitoneum. No abdominal wall hernia. Musculoskeletal: No acute or significant osseous findings. IMPRESSION: 1. Obstructing 6 x 4 mm stone at the left UVJ results in moderate left hydroureteronephrosis. 2. Cholelithiasis without evidence of acute cholecystitis. Electronically Signed   By: Duanne Guess D.O.   On: 01/10/2020 10:10     Discharge Exam: Vitals:   01/11/20 1423 01/11/20 1444  BP: (!) 150/88 (!) 161/97  Pulse: 80 86  Resp: 17 16  Temp:  98.9 F (37.2 C)  SpO2: 96% 98%   Vitals:   01/11/20 1404 01/11/20 1415 01/11/20 1423 01/11/20 1444  BP:  (!) 147/91 (!) 150/88 (!) 161/97  Pulse: 84 100 80 86  Resp: Temp:    98.9 F (37.2 C)  TempSrc:    Oral  SpO2: 97% 96% 96% 98%  Weight:      Height:        General: Pt is alert, awake, not in acute distress Cardiovascular: RRR, S1/S2 +, no rubs, no gallops Respiratory: CTA bilaterally, no wheezing,  no rhonchi Abdominal: Soft, NT, ND, bowel sounds + Extremities: no edema, no cyanosis    The results of significant diagnostics from this hospitalization (including imaging, microbiology, ancillary and laboratory) are listed below for reference.     Microbiology: Recent Results (from the past 240 hour(s))  Microscopic Examination     Status: Abnormal   Collection Time: 01/09/20  1:46 PM   Urine  Result Value Ref Range Status   WBC, UA 0-5 0 - 5 /hpf Final   RBC >30 (A) 0 - 2 /hpf Final   Epithelial Cells (non renal) >10 (A) 0 - 10 /hpf Final   Renal Epithel, UA None seen None seen /hpf Final   Bacteria, UA Few None seen/Few Final  Resp Panel by RT PCR (RSV, Flu A&B, Covid) - Nasopharyngeal Swab     Status: None   Collection Time: 01/10/20 11:54 AM   Specimen: Nasopharyngeal Swab   Result Value Ref Range Status   SARS Coronavirus 2 by RT PCR NEGATIVE NEGATIVE Final    Comment: (NOTE) SARS-CoV-2 target nucleic acids are NOT DETECTED.  The SARS-CoV-2 RNA is generally detectable in upper respiratoy specimens during the acute phase of infection. The lowest concentration of SARS-CoV-2 viral copies this assay can detect is 131 copies/mL. A negative result does not preclude SARS-Cov-2 infection and should not be used as the sole basis for treatment or other patient management decisions. A negative result may occur with  improper specimen collection/handling, submission of specimen other than nasopharyngeal swab, presence of viral mutation(s) within the areas targeted by this assay, and inadequate number of viral copies (<131 copies/mL). A negative result must be combined with clinical observations, patient history, and epidemiological information. The expected result is Negative.  Fact Sheet for Patients:  https://www.moore.com/  Fact Sheet for Healthcare Providers:  https://www.young.biz/  This test is no t yet approved or cleared by the [place] FDA and  has been authorized for detection and/or diagnosis of SARS-CoV-2 by FDA under an Emergency Use Authorization (EUA). This EUA will remain  in effect (meaning this test can be used) for the duration of the COVID-19 declaration under Section 564(b)(1) of the Act, 21 U.S.C. section 360bbb-3(b)(1), unless the authorization is terminated or revoked sooner.     Influenza A by PCR NEGATIVE NEGATIVE Final   Influenza B by PCR NEGATIVE NEGATIVE Final    Comment: (NOTE) The Xpert Xpress SARS-CoV-2/FLU/RSV assay is intended as an aid in  the diagnosis of influenza from Nasopharyngeal swab specimens and  should not be used as a sole basis for treatment. Nasal washings and  aspirates are unacceptable for Xpert Xpress SARS-CoV-2/FLU/RSV  testing.  Fact Sheet for  Patients: https://www.moore.com/  Fact Sheet for Healthcare Providers: https://www.young.biz/  This test is not yet approved or cleared by the [place] FDA and  has been authorized for detection and/or diagnosis of SARS-CoV-2 by  FDA under an Emergency Use Authorization (EUA). This EUA will remain  in effect (meaning this test can be used) for the duration of the  Covid-19 declaration under Section 564(b)(1) of the Act, 21  U.S.C. section 360bbb-3(b)(1), unless the authorization is  terminated or revoked.    Respiratory Syncytial Virus by PCR NEGATIVE NEGATIVE Final    Comment: (NOTE) Fact Sheet for Patients: https://www.moore.com/  Fact Sheet for Healthcare Providers: https://www.young.biz/  This test is not yet approved or cleared by the [place] FDA and  has been authorized for detection and/or diagnosis of SARS-CoV-2 by  FDA under an  Emergency Use Authorization (EUA). This EUA will remain  in effect (meaning this test can be used) for the duration of the  COVID-19 declaration under Section 564(b)(1) of the Act, 21 U.S.C.  section 360bbb-3(b)(1), unless the authorization is terminated or  revoked. Performed at [facility], [address] [place], [place] [address]      Labs: BNP (last 3 results) No results for input(s): BNP in the last 8760 hours. Basic Metabolic Panel: Recent Labs  Lab 01/10/20 1007 01/11/20 0342  NA 140 139  K 3.7 3.6  CL 104 103  CO2 27 27  GLUCOSE 108* 100*  BUN 9 7  CREATININE 0.71 0.69  CALCIUM 8.5* 8.3*  MG  --  1.8   Liver Function Tests: Recent Labs  Lab 01/10/20 1007  AST 21  ALT 15  ALKPHOS 39  BILITOT 0.5  PROT 6.7  ALBUMIN 3.8   No results for input(s): LIPASE, AMYLASE in the last 168 hours. No results for input(s): AMMONIA in the last 168 hours. CBC: Recent Labs  Lab 01/10/20 1007 01/11/20 0342  WBC 12.5* 7.2  NEUTROABS 11.0*   --   HGB 12.7 12.0  HCT 40.9 38.7  MCV 86.7 86.6  PLT 364 316   Cardiac Enzymes: No results for input(s): CKTOTAL, CKMB, CKMBINDEX, TROPONINI in the last 168 hours. BNP: Invalid input(s): POCBNP CBG: No results for input(s): GLUCAP in the last 168 hours. D-Dimer No results for input(s): DDIMER in the last 72 hours. Hgb A1c No results for input(s): HGBA1C in the last 72 hours. Lipid Profile No results for input(s): CHOL, HDL, LDLCALC, TRIG, CHOLHDL, LDLDIRECT in the last 72 hours. Thyroid function studies No results for input(s): TSH, T4TOTAL, T3FREE, THYROIDAB in the last 72 hours.  Invalid input(s): FREET3 Anemia work up No results for input(s): VITAMINB12, FOLATE, FERRITIN, TIBC, IRON, RETICCTPCT in the last 72 hours. Urinalysis    Component Value Date/Time   COLORURINE AMBER (A) 01/10/2020 0954   APPEARANCEUR CLOUDY (A) 01/10/2020 0954   APPEARANCEUR Clear 01/09/2020 1346   LABSPEC 1.014 01/10/2020 0954   PHURINE 6.0 01/10/2020 0954   GLUCOSEU NEGATIVE 01/10/2020 0954   HGBUR LARGE (A) 01/10/2020 0954   BILIRUBINUR NEGATIVE 01/10/2020 0954   BILIRUBINUR Negative 01/09/2020 1346   KETONESUR NEGATIVE 01/10/2020 0954   PROTEINUR 100 (A) 01/10/2020 0954   NITRITE POSITIVE (A) 01/10/2020 0954   LEUKOCYTESUR NEGATIVE 01/10/2020 0954   Sepsis Labs Invalid input(s): PROCALCITONIN,  WBC,  LACTICIDVEN Microbiology Recent Results (from the past 240 hour(s))  Microscopic Examination     Status: Abnormal   Collection Time: 01/09/20  1:46 PM   Urine  Result Value Ref Range Status   WBC, UA 0-5 0 - 5 /hpf Final   RBC >30 (A) 0 - 2 /hpf Final   Epithelial Cells (non renal) >10 (A) 0 - 10 /hpf Final   Renal Epithel, UA None seen None seen /hpf Final   Bacteria, UA Few None seen/Few Final  Resp Panel by RT PCR (RSV, Flu A&B, Covid) - Nasopharyngeal Swab     Status: None   Collection Time: 01/10/20 11:54 AM   Specimen: Nasopharyngeal Swab  Result Value Ref Range Status    SARS Coronavirus 2 by RT PCR NEGATIVE NEGATIVE Final    Comment: (NOTE) SARS-CoV-2 target nucleic acids are NOT DETECTED.  The SARS-CoV-2 RNA is generally detectable in upper respiratoy specimens during the acute phase of infection. The lowest concentration of SARS-CoV-2 viral copies this assay can detect is 131  copies/mL. A negative result does not preclude SARS-Cov-2 infection and should not be used as the sole basis for treatment or other patient management decisions. A negative result may occur with  improper specimen collection/handling, submission of specimen other than nasopharyngeal swab, presence of viral mutation(s) within the areas targeted by this assay, and inadequate number of viral copies (<131 copies/mL). A negative result must be combined with clinical observations, patient history, and epidemiological information. The expected result is Negative.  Fact Sheet for Patients:  https://www.moore.com/https://www.fda.gov/media/142436/download  Fact Sheet for Healthcare Providers:  https://www.young.biz/https://www.fda.gov/media/142435/download  This test is no t yet approved or cleared by the [place] FDA and  has been authorized for detection and/or diagnosis of SARS-CoV-2 by FDA under an Emergency Use Authorization (EUA). This EUA will remain  in effect (meaning this test can be used) for the duration of the COVID-19 declaration under Section 564(b)(1) of the Act, 21 U.S.C. section 360bbb-3(b)(1), unless the authorization is terminated or revoked sooner.     Influenza A by PCR NEGATIVE NEGATIVE Final   Influenza B by PCR NEGATIVE NEGATIVE Final    Comment: (NOTE) The Xpert Xpress SARS-CoV-2/FLU/RSV assay is intended as an aid in  the diagnosis of influenza from Nasopharyngeal swab specimens and  should not be used as a sole basis for treatment. Nasal washings and  aspirates are unacceptable for Xpert Xpress SARS-CoV-2/FLU/RSV  testing.  Fact Sheet for  Patients: https://www.moore.com/https://www.fda.gov/media/142436/download  Fact Sheet for Healthcare Providers: https://www.young.biz/https://www.fda.gov/media/142435/download  This test is not yet approved or cleared by the [place] FDA and  has been authorized for detection and/or diagnosis of SARS-CoV-2 by  FDA under an Emergency Use Authorization (EUA). This EUA will remain  in effect (meaning this test can be used) for the duration of the  Covid-19 declaration under Section 564(b)(1) of the Act, 21  U.S.C. section 360bbb-3(b)(1), unless the authorization is  terminated or revoked.    Respiratory Syncytial Virus by PCR NEGATIVE NEGATIVE Final    Comment: (NOTE) Fact Sheet for Patients: https://www.moore.com/https://www.fda.gov/media/142436/download  Fact Sheet for Healthcare Providers: https://www.young.biz/https://www.fda.gov/media/142435/download  This test is not yet approved or cleared by the [place] FDA and  has been authorized for detection and/or diagnosis of SARS-CoV-2 by  FDA under an Emergency Use Authorization (EUA). This EUA will remain  in effect (meaning this test can be used) for the duration of the  COVID-19 declaration under Section 564(b)(1) of the Act, 21 U.S.C.  section 360bbb-3(b)(1), unless the authorization is terminated or  revoked. Performed at [facility], [address] [place], [place] [address]      Time coordinating discharge: 35 minutes  SIGNED:   Erick BlinksPratik D Dhruvi Crenshaw, DO [facility] 01/11/2020, 3:13 PM  If 7PM-7AM, please contact night-coverage [email or web address]

## 2020-01-11 NOTE — Transfer of Care (Signed)
Immediate Anesthesia Transfer of Care Note  Patient: [name]  Procedure(s) Performed: CYSTOSCOPY WITH RETROGRADE PYELOGRAM/URETERAL STENT PLACEMENT (Left ) HOLMIUM LASER APPLICATION-ureteroscopy (Left )  Patient Location: PACU  Anesthesia Type:General  Level of Consciousness: awake, alert , oriented and patient cooperative  Airway & Oxygen Therapy: Patient Spontanous Breathing  Post-op Assessment: Report given to RN, Post -op Vital signs reviewed and stable and Patient moving all extremities  Post vital signs: Reviewed and stable  Last Vitals:  Vitals Value Taken Time  BP 159/85 01/11/20 1339  Temp    Pulse 117 01/11/20 1341  Resp 20 01/11/20 1341  SpO2 97 % 01/11/20 1341  Vitals shown include unvalidated device data.  Last Pain:  Vitals:   01/11/20 1027  TempSrc: Oral  PainSc: 4       Patients Stated Pain Goal: 7 (01/11/20 1027)  Complications: No complications documented.

## 2020-01-11 NOTE — Anesthesia Postprocedure Evaluation (Signed)
Anesthesia Post Note  Patient: [name]  Procedure(s) Performed: CYSTOSCOPY WITH RETROGRADE PYELOGRAM/URETERAL STENT PLACEMENT (Left ) HOLMIUM LASER APPLICATION-ureteroscopy (Left )  Patient location during evaluation: PACU Anesthesia Type: General Level of consciousness: awake, oriented, awake and alert and patient cooperative Pain management: pain level controlled Vital Signs Assessment: post-procedure vital signs reviewed and stable Respiratory status: spontaneous breathing, respiratory function stable and nonlabored ventilation Cardiovascular status: blood pressure returned to baseline and stable Postop Assessment: no headache and no backache Anesthetic complications: no   No complications documented.   Last Vitals:  Vitals:   01/11/20 0900 01/11/20 1027  BP: (!) 147/86 (!) 180/83  Pulse: 69 74  Resp: 18 18  Temp: 37.2 C 37.2 C  SpO2: 98% 97%    Last Pain:  Vitals:   01/11/20 1027  TempSrc: Oral  PainSc: 4                  Brynda Peon

## 2020-01-11 NOTE — Anesthesia Preprocedure Evaluation (Signed)
Anesthesia Evaluation  Patient identified by MRN, date of birth, ID band Patient awake    Reviewed: Allergy & Precautions, NPO status , Patient's Chart, lab work & pertinent test results  History of Anesthesia Complications Negative for: history of anesthetic complications  Airway Mallampati: II  TM Distance: >3 FB Neck ROM: Full    Dental  (+) Dental Advisory Given, Teeth Intact   Pulmonary neg pulmonary ROS,    Pulmonary exam normal breath sounds clear to auscultation       Cardiovascular Exercise Tolerance: Good hypertension, Pt. on medications Normal cardiovascular exam Rhythm:Regular Rate:Normal     Neuro/Psych  Headaches, PSYCHIATRIC DISORDERS Anxiety Depression    GI/Hepatic GERD  Controlled,  Endo/Other  negative endocrine ROS  Renal/GU Renal disease (stones )     Musculoskeletal negative musculoskeletal ROS (+)   Abdominal   Peds  Hematology  (+) anemia ,   Anesthesia Other Findings   Reproductive/Obstetrics negative OB ROS                             Anesthesia Physical Anesthesia Plan  ASA: II  Anesthesia Plan: General   Post-op Pain Management:    Induction: Intravenous  PONV Risk Score and Plan: Ondansetron, Dexamethasone and Midazolam  Airway Management Planned: Oral ETT and LMA  Additional Equipment:   Intra-op Plan:   Post-operative Plan: Extubation in OR  Informed Consent: I have reviewed the patients History and Physical, chart, labs and discussed the procedure including the risks, benefits and alternatives for the proposed anesthesia with the patient or authorized representative who has indicated his/her understanding and acceptance.     Dental advisory given  Plan Discussed with: CRNA and Surgeon  Anesthesia Plan Comments:         Anesthesia Quick Evaluation

## 2020-01-12 LAB — URINE CULTURE: Organism ID, Bacteria: NO GROWTH

## 2020-01-12 MED ORDER — FENTANYL CITRATE (PF) 100 MCG/2ML IJ SOLN
INTRAMUSCULAR | Status: AC
  Filled 2020-01-12: qty 2

## 2020-01-14 ENCOUNTER — Encounter: Payer: Self-pay | Admitting: Urology

## 2020-01-15 ENCOUNTER — Encounter (HOSPITAL_COMMUNITY): Payer: Self-pay | Admitting: Urology

## 2020-01-15 ENCOUNTER — Ambulatory Visit (HOSPITAL_COMMUNITY): Admission: RE | Admit: 2020-01-15 | Payer: BC Managed Care – PPO | Source: Ambulatory Visit

## 2020-01-17 ENCOUNTER — Other Ambulatory Visit: Payer: Self-pay

## 2020-01-17 ENCOUNTER — Ambulatory Visit (INDEPENDENT_AMBULATORY_CARE_PROVIDER_SITE_OTHER): Payer: BC Managed Care – PPO | Admitting: Urology

## 2020-01-17 ENCOUNTER — Encounter: Payer: Self-pay | Admitting: Urology

## 2020-01-17 DIAGNOSIS — N200 Calculus of kidney: Secondary | ICD-10-CM

## 2020-01-17 DIAGNOSIS — N390 Urinary tract infection, site not specified: Secondary | ICD-10-CM | POA: Diagnosis not present

## 2020-01-17 LAB — CALCULI, WITH PHOTOGRAPH (CLINICAL LAB)
Calcium Oxalate Dihydrate: 30 %
Calcium Oxalate Monohydrate: 70 %
Weight Calculi: 13 mg

## 2020-01-17 MED ORDER — CIPROFLOXACIN HCL 500 MG PO TABS: 500.0000 mg | ORAL_TABLET | Freq: Once | ORAL | Status: DC

## 2020-01-17 NOTE — Patient Instructions (Signed)
Dietary Guidelines to Help Prevent Kidney Stones Kidney stones are deposits of minerals and salts that form inside your kidneys. Your risk of developing kidney stones may be greater depending on your diet, your lifestyle, the medicines you take, and whether you have certain medical conditions. Most people can reduce their chances of developing kidney stones by following the instructions below. Depending on your overall health and the type of kidney stones you tend to develop, your dietitian may give you more specific instructions. What are tips for following this plan? Reading food labels  Choose foods with "no salt added" or "low-salt" labels. Limit your sodium intake to less than 1500 mg per day.  Choose foods with calcium for each meal and snack. Try to eat about 300 mg of calcium at each meal. Foods that contain 200-500 mg of calcium per serving include: ? 8 oz (237 ml) of milk, fortified nondairy milk, and fortified fruit juice. ? 8 oz (237 ml) of kefir, yogurt, and soy yogurt. ? 4 oz (118 ml) of tofu. ? 1 oz of cheese. ? 1 cup (300 g) of dried figs. ? 1 cup (91 g) of cooked broccoli. ? 1-3 oz can of sardines or mackerel.  Most people need 1000 to 1500 mg of calcium each day. Talk to your dietitian about how much calcium is recommended for you. Shopping  Buy plenty of fresh fruits and vegetables. Most people do not need to avoid fruits and vegetables, even if they contain nutrients that may contribute to kidney stones.  When shopping for convenience foods, choose: ? Whole pieces of fruit. ? Premade salads with dressing on the side. ? Low-fat fruit and yogurt smoothies.  Avoid buying frozen meals or prepared deli foods.  Look for foods with live cultures, such as yogurt and kefir. Cooking  Do not add salt to food when cooking. Place a salt shaker on the table and allow each person to add his or her own salt to taste.  Use vegetable protein, such as beans, textured vegetable  protein (TVP), or tofu instead of meat in pasta, casseroles, and soups. Meal planning   Eat less salt, if told by your dietitian. To do this: ? Avoid eating processed or premade food. ? Avoid eating fast food.  Eat less animal protein, including cheese, meat, poultry, or fish, if told by your dietitian. To do this: ? Limit the number of times you have meat, poultry, fish, or cheese each week. Eat a diet free of meat at least 2 days a week. ? Eat only one serving each day of meat, poultry, fish, or seafood. ? When you prepare animal protein, cut pieces into small portion sizes. For most meat and fish, one serving is about the size of one deck of cards.  Eat at least 5 servings of fresh fruits and vegetables each day. To do this: ? Keep fruits and vegetables on hand for snacks. ? Eat 1 piece of fruit or a handful of berries with breakfast. ? Have a salad and fruit at lunch. ? Have two kinds of vegetables at dinner.  Limit foods that are high in a substance called oxalate. These include: ? Spinach. ? Rhubarb. ? Beets. ? Potato chips and french fries. ? Nuts.  If you regularly take a diuretic medicine, make sure to eat at least 1-2 fruits or vegetables high in potassium each day. These include: ? Avocado. ? Banana. ? Orange, prune, carrot, or tomato juice. ? Baked potato. ? Cabbage. ? Beans and split   peas. General instructions   Drink enough fluid to keep your urine clear or pale yellow. This is the most important thing you can do.  Talk to your health care provider and dietitian about taking daily supplements. Depending on your health and the cause of your kidney stones, you may be advised: ? Not to take supplements with vitamin C. ? To take a calcium supplement. ? To take a daily probiotic supplement. ? To take other supplements such as magnesium, fish oil, or vitamin B6.  Take all medicines and supplements as told by your health care provider.  Limit alcohol intake to no  more than 1 drink a day for nonpregnant women and 2 drinks a day for men. One drink equals 12 oz of beer, 5 oz of wine, or 1 oz of hard liquor.  Lose weight if told by your health care provider. Work with your dietitian to find strategies and an eating plan that works best for you. What foods are not recommended? Limit your intake of the following foods, or as told by your dietitian. Talk to your dietitian about specific foods you should avoid based on the type of kidney stones and your overall health. Grains Breads. Bagels. Rolls. Baked goods. Salted crackers. Cereal. Pasta. Vegetables Spinach. Rhubarb. Beets. Canned vegetables. Pickles. Olives. Meats and other protein foods Nuts. Nut butters. Large portions of meat, poultry, or fish. Salted or cured meats. Deli meats. Hot dogs. Sausages. Dairy Cheese. Beverages Regular soft drinks. Regular vegetable juice. Seasonings and other foods Seasoning blends with salt. Salad dressings. Canned soups. Soy sauce. Ketchup. Barbecue sauce. Canned pasta sauce. Casseroles. Pizza. Lasagna. Frozen meals. Potato chips. French fries. Summary  You can reduce your risk of kidney stones by making changes to your diet.  The most important thing you can do is drink enough fluid. You should drink enough fluid to keep your urine clear or pale yellow.  Ask your health care provider or dietitian how much protein from animal sources you should eat each day, and also how much salt and calcium you should have each day. This information is not intended to replace advice given to you by your health care provider. Make sure you discuss any questions you have with your health care provider. Document Revised: 06/01/2018 Document Reviewed: 01/21/2016 Elsevier Patient Education  2020 [employer]  

## 2020-01-17 NOTE — Progress Notes (Signed)
   01/17/20  CC: Followup nephrolithiasis  HPI: [name] is a 39yo here for ureteral stent removal  There were no vitals taken for this visit. NED. A&Ox3.   No respiratory distress   Abd soft, NT, ND Normal external genitalia with patent urethral meatus  Cystoscopy Procedure Note  Patient identification was confirmed, informed consent was obtained, and patient was prepped using Betadine solution.  Lidocaine jelly was administered per urethral meatus.    Procedure: - Flexible cystoscope introduced, without any difficulty.   - Thorough search of the bladder revealed:    normal urethral meatus    normal urothelium    no stones    no ulcers     no tumors    no urethral polyps    no trabeculation  - Ureteral orifices were normal in position and appearance.  USING A GRASPER THE LEFT URETERAL STENT WAS REMOVED INTACT  Post-Procedure: - Patient tolerated the procedure well  Assessment/ Plan: RTC 1 month with renal US   No follow-ups on file.  Wilkie Aye, MD

## 2020-01-17 NOTE — Progress Notes (Signed)
Urological Symptom Review  Patient is experiencing the following symptoms: Burning/pain with urination Trouble starting stream Blood in urine   Review of Systems  Gastrointestinal (upper)  : Nausea  Gastrointestinal (lower) : Negative for lower GI symptoms  Constitutional : Negative for symptoms  Skin: Negative for skin symptoms  Eyes: Negative for eye symptoms  Ear/Nose/Throat : Negative for Ear/Nose/Throat symptoms  Hematologic/Lymphatic: Negative for Hematologic/Lymphatic symptoms  Cardiovascular : Negative for cardiovascular symptoms  Respiratory : Negative for respiratory symptoms  Endocrine: Negative for endocrine symptoms  Musculoskeletal: Negative for musculoskeletal symptoms  Neurological: Negative for neurological symptoms  Psychologic: Negative for psychiatric symptoms

## 2020-01-18 LAB — URINALYSIS, ROUTINE W REFLEX MICROSCOPIC
Bilirubin, UA: NEGATIVE
Nitrite, UA: NEGATIVE
Specific Gravity, UA: 1.025 (ref 1.005–1.030)
Urobilinogen, Ur: 2 mg/dL — ABNORMAL HIGH (ref 0.2–1.0)
pH, UA: 6 (ref 5.0–7.5)

## 2020-01-18 LAB — MICROSCOPIC EXAMINATION
RBC, Urine: >30 /hpf — ABNORMAL HIGH (ref 0–2)
WBC, UA: NONE SEEN /hpf (ref 0–5)

## 2020-01-25 ENCOUNTER — Ambulatory Visit: Payer: BC Managed Care – PPO | Admitting: Urology

## 2020-01-29 ENCOUNTER — Emergency Department (HOSPITAL_COMMUNITY): Payer: BC Managed Care – PPO

## 2020-01-29 ENCOUNTER — Encounter (HOSPITAL_COMMUNITY): Payer: Self-pay

## 2020-01-29 ENCOUNTER — Other Ambulatory Visit: Payer: Self-pay

## 2020-01-29 ENCOUNTER — Emergency Department (HOSPITAL_COMMUNITY)
Admission: EM | Admit: 2020-01-29 | Discharge: 2020-01-29 | Disposition: A | Discharge status: Home / Self Care | Payer: BC Managed Care – PPO | Attending: Emergency Medicine | Admitting: Emergency Medicine

## 2020-01-29 DIAGNOSIS — E876 Hypokalemia: Secondary | ICD-10-CM | POA: Insufficient documentation

## 2020-01-29 DIAGNOSIS — R0789 Other chest pain: Secondary | ICD-10-CM | POA: Diagnosis present

## 2020-01-29 DIAGNOSIS — I10 Essential (primary) hypertension: Secondary | ICD-10-CM | POA: Diagnosis not present

## 2020-01-29 DIAGNOSIS — Z79899 Other long term (current) drug therapy: Secondary | ICD-10-CM | POA: Diagnosis not present

## 2020-01-29 LAB — CBC WITH DIFFERENTIAL/PLATELET
Abs Immature Granulocytes: 0.04 10*3/uL (ref 0.00–0.07)
Basophils Absolute: 0 10*3/uL (ref 0.0–0.1)
Basophils Relative: 0 %
Eosinophils Absolute: 0.1 10*3/uL (ref 0.0–0.5)
Eosinophils Relative: 1 %
HCT: 40.6 % (ref 36.0–46.0)
Hemoglobin: 13.1 g/dL (ref 12.0–15.0)
Immature Granulocytes: 0 %
Lymphocytes Relative: 16 %
Lymphs Abs: 1.5 10*3/uL (ref 0.7–4.0)
MCH: 27.8 pg (ref 26.0–34.0)
MCHC: 32.3 g/dL (ref 30.0–36.0)
MCV: 86 fL (ref 80.0–100.0)
Monocytes Absolute: 0.5 10*3/uL (ref 0.1–1.0)
Monocytes Relative: 5 %
Neutro Abs: 7.4 10*3/uL (ref 1.7–7.7)
Neutrophils Relative %: 78 %
Platelets: 401 10*3/uL — ABNORMAL HIGH (ref 150–400)
RBC: 4.72 MIL/uL (ref 3.87–5.11)
RDW: 16.8 % — ABNORMAL HIGH (ref 11.5–15.5)
WBC: 9.6 10*3/uL (ref 4.0–10.5)
nRBC: 0 % (ref 0.0–0.2)

## 2020-01-29 LAB — COMPREHENSIVE METABOLIC PANEL
ALT: 17 U/L (ref 0–44)
AST: 23 U/L (ref 15–41)
Albumin: 3.9 g/dL (ref 3.5–5.0)
Alkaline Phosphatase: 45 U/L (ref 38–126)
Anion gap: 10 (ref 5–15)
BUN: 14 mg/dL (ref 6–20)
CO2: 26 mmol/L (ref 22–32)
Calcium: 9.8 mg/dL (ref 8.9–10.3)
Chloride: 101 mmol/L (ref 98–111)
Creatinine, Ser: 0.85 mg/dL (ref 0.44–1.00)
GFR, Estimated: >60 mL/min (ref >60)
Glucose, Bld: 118 mg/dL — ABNORMAL HIGH (ref 70–99)
Potassium: 3.1 mmol/L — ABNORMAL LOW (ref 3.5–5.1)
Sodium: 137 mmol/L (ref 135–145)
Total Bilirubin: 0.3 mg/dL (ref 0.3–1.2)
Total Protein: 7.3 g/dL (ref 6.5–8.1)

## 2020-01-29 LAB — TROPONIN I (HIGH SENSITIVITY)
Troponin I (High Sensitivity): 4 ng/L (ref <18)
Troponin I (High Sensitivity): 4 ng/L (ref <18)

## 2020-01-29 LAB — D-DIMER, QUANTITATIVE: D-Dimer, Quant: 0.27 ug/mL-FEU (ref 0.00–0.50)

## 2020-01-29 MED ORDER — ACETAMINOPHEN 500 MG PO TABS
1000.0000 mg | ORAL_TABLET | Freq: Once | ORAL | Status: AC
  Administered 2020-01-29: 1000 mg via ORAL
  Filled 2020-01-29: qty 2

## 2020-01-29 MED ORDER — KETOROLAC TROMETHAMINE 30 MG/ML IJ SOLN: 30.0000 mg | Freq: Once | INTRAMUSCULAR | Status: DC

## 2020-01-29 MED ORDER — KETOROLAC TROMETHAMINE 30 MG/ML IJ SOLN
30.0000 mg | Freq: Once | INTRAMUSCULAR | Status: AC
  Administered 2020-01-29: 30 mg via INTRAMUSCULAR
  Filled 2020-01-29: qty 1

## 2020-01-29 MED ORDER — METHOCARBAMOL 750 MG PO TABS: ORAL_TABLET | ORAL | 0 refills | Status: DC

## 2020-01-29 MED ORDER — ONDANSETRON 4 MG PO TBDP
4.0000 mg | ORAL_TABLET | Freq: Once | ORAL | Status: AC
  Administered 2020-01-29: 4 mg via ORAL
  Filled 2020-01-29: qty 1

## 2020-01-29 MED ORDER — POTASSIUM CHLORIDE CRYS ER 20 MEQ PO TBCR
40.0000 meq | EXTENDED_RELEASE_TABLET | Freq: Once | ORAL | Status: AC
  Administered 2020-01-29: 40 meq via ORAL
  Filled 2020-01-29: qty 2

## 2020-01-29 MED ORDER — ASPIRIN 325 MG PO TABS
325.0000 mg | ORAL_TABLET | Freq: Once | ORAL | Status: AC
  Administered 2020-01-29: 325 mg via ORAL
  Filled 2020-01-29: qty 1

## 2020-01-29 MED ORDER — NITROGLYCERIN 2 % TD OINT
1.0000 [in_us] | TOPICAL_OINTMENT | Freq: Once | TRANSDERMAL | Status: AC
  Administered 2020-01-29: 1 [in_us] via TOPICAL
  Filled 2020-01-29: qty 1

## 2020-01-29 MED ORDER — NAPROXEN 500 MG PO TABS: ORAL_TABLET | ORAL | 0 refills | Status: DC

## 2020-01-29 MED ORDER — METHOCARBAMOL 500 MG PO TABS
500.0000 mg | ORAL_TABLET | Freq: Once | ORAL | Status: AC
  Administered 2020-01-29: 500 mg via ORAL
  Filled 2020-01-29: qty 1

## 2020-01-29 NOTE — Discharge Instructions (Addendum)
Use ice and heat for comfort.  Take the medications as prescribed.  Please let your provider know about your ED visit tonight for chest pain.  You may want to have a cardiology evaluation at some point if you continue having chest pains.

## 2020-01-29 NOTE — ED Provider Notes (Signed)
[facility] EMERGENCY DEPARTMENT Provider Note   CSN: [id number] Arrival date & time: 01/29/20  0050   Time seen 1:53 AM  History Chief Complaint  Patient presents with  . Chest Pain    [name] is a 39 y.o. female.  HPI   Patient states about midnight she was awakened from sleep with central chest pain that radiates into her left shoulder and into her left posterior back.  She describes as pressure and aching and burning.  She denies shortness of breath but did have nausea and sweating without vomiting.  She states nothing she does makes the pain worse, nothing she does makes it feel better.  She states she took Tums without relief.  She states that she has never had this pain before despite several prior ED visits for chest pain.  She states she has a history of high cholesterol and hypertension and she manages her cholesterol by diet.  She states her blood pressure is normally 140/80.  She denies any change in her diet or any change in her activity that could have caused the pain tonight.  She states her family history is negative for coronary artery disease but is significant for hypertension.  She states this chest pain is different from the chest pain she has been to the ED for several times before.  PCP Patient, [name] GYN [name] [facility]  Past Medical History:  Diagnosis Date  . Abnormal pap   . Anemia 10/20/2019   Add iron  . Anxiety   . Depression   . Elevated triglycerides with high cholesterol 10/20/2019   Try lifestyle modifications and consider meds too  . GERD (gastroesophageal reflux disease)   . Hypertension   . Vaginal Pap smear, abnormal     Patient Active Problem List   Diagnosis Date Noted  . Hydroureteronephrosis 01/10/2020  . Elevated triglycerides with high cholesterol 10/20/2019  . Anemia 10/20/2019  . Dyspareunia, female 10/18/2019  . Encounter for gynecological examination with Papanicolaou smear of cervix 10/18/2019  .  Frequent headaches 10/18/2019  . Tired 10/18/2019  . Elevated BP without diagnosis of hypertension 10/18/2019  . History of abnormal cervical Pap smear 10/18/2019  . 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 Gastroesophageal reflux disease without esophagitis 03/17/2016  . Unspecified gastritis and gastroduodenitis without mention of hemorrhage 10/02/2013  . Depression with anxiety 06/22/2013    Past Surgical History:  Procedure Laterality Date  . CESAREAN SECTION    . CYSTOSCOPY W/ URETERAL STENT PLACEMENT Left 01/11/2020   Procedure: CYSTOSCOPY WITH RETROGRADE PYELOGRAM/URETERAL STENT PLACEMENT;  Surgeon: Malen Gauze, MD;  Location: [facility];  Service: Urology;  Laterality: Left;  . ectopic pregnancies    . HOLMIUM LASER APPLICATION Left 01/11/2020   Procedure: HOLMIUM LASER APPLICATION-ureteroscopy;  Surgeon: Malen Gauze, MD;  Location: [facility];  Service: Urology;  Laterality: Left;  . left fallopian tube removal    . right tube removed     history of ectopic pregnancies     OB History    Gravida  4   Para  1   Term  1   Preterm      AB  3   Living  1     SAB      TAB      Ectopic  3   Multiple      Live Births              Family History  Problem Relation Age of  Onset  . Hypertension Mother   . Cancer Maternal Grandmother        colon cancer  . Cancer Maternal Grandfather        colon cancer    Social History   Tobacco Use  . Smoking status: Never Smoker  . Smokeless tobacco: Never Used  Vaping Use  . Vaping Use: Never used  Substance Use Topics  . Alcohol use: No  . Drug use: Not Currently    Types: Benzodiazepines, Marijuana    Comment: opiates- Former    Home Medications Prior to Admission medications   Medication Sig Start Date End Date Taking? Authorizing Provider  ferrous sulfate 324 MG TBEC Take 324 mg by mouth.    [provider]  hydrochlorothiazide (MICROZIDE) 12.5 MG capsule Take 1 capsule (12.5 mg  total) by mouth daily. 10/18/19   Adline Potter, NP  ketorolac (TORADOL) 10 MG tablet Take 1 tablet (10 mg total) by mouth every 6 (six) hours as needed. 01/11/20   Sherryll Burger, Pratik D, DO  methocarbamol (ROBAXIN) 750 MG tablet Take 1 or 2 po Q 6hrs for pain 01/29/20   Devoria Albe, MD  Multiple Vitamin (MULTIVITAMIN) tablet Take 1 tablet by mouth daily.    [provider]  naproxen (NAPROSYN) 500 MG tablet Take 1 po BID with food prn pain 01/29/20   Devoria Albe, MD  ondansetron (ZOFRAN) 4 MG tablet Take 1 tablet (4 mg total) by mouth every 6 (six) hours as needed for nausea. 01/11/20   Sherryll Burger, Pratik D, DO    Allergies    Augmentin [amoxicillin-pot clavulanate]  Review of Systems   Review of Systems  All other systems reviewed and are negative.   Physical Exam Updated Vital Signs BP 125/79   Pulse 60   Temp 98.7 F (37.1 C) (Oral)   Resp 18   Ht 5' (1.524 m)   Wt 81.6 kg   SpO2 97%   BMI 35.15 kg/m   Physical Exam Vitals and nursing note reviewed.  Constitutional:      General: She is not in acute distress.    Appearance: Normal appearance. She is obese.  HENT:     Head: Normocephalic and atraumatic.     Right Ear: External ear normal.     Left Ear: External ear normal.     Mouth/Throat:     Mouth: Mucous membranes are dry.  Eyes:     Extraocular Movements: Extraocular movements intact.     Conjunctiva/sclera: Conjunctivae normal.     Pupils: Pupils are equal, round, and reactive to light.  Cardiovascular:     Rate and Rhythm: Normal rate and regular rhythm.     Pulses: Normal pulses.     Heart sounds: Normal heart sounds. No murmur heard.   Pulmonary:     Effort: Pulmonary effort is normal. No respiratory distress.     Breath sounds: Normal breath sounds.  Chest:     Chest wall: No tenderness.  Musculoskeletal:        General: Normal range of motion.       Arms:     Cervical back: Normal range of motion.     Comments: Area of pain  Skin:    General:  Skin is warm and dry.     Findings: No erythema or rash.  Neurological:     General: No focal deficit present.     Mental Status: She is alert and oriented to person, place, and time.  Cranial Nerves: No cranial nerve deficit.  Psychiatric:        Mood and Affect: Affect is flat.        Speech: Speech normal.        Behavior: Behavior normal. Behavior is cooperative.     ED Results / Procedures / Treatments   Labs (all labs ordered are listed, but only abnormal results are displayed) Results for orders placed or performed during the hospital encounter of 01/29/20  Comprehensive metabolic panel  Result Value Ref Range   Sodium 137 135 - 145 mmol/L   Potassium 3.1 (L) 3.5 - 5.1 mmol/L   Chloride 101 98 - 111 mmol/L   CO2 26 22 - 32 mmol/L   Glucose, Bld 118 (H) 70 - 99 mg/dL   BUN 14 6 - 20 mg/dL   Creatinine, Ser 1.610.85 0.44 - 1.00 mg/dL   Calcium 9.8 8.9 - 09.610.3 mg/dL   Total Protein 7.3 6.5 - 8.1 g/dL   Albumin 3.9 3.5 - 5.0 g/dL   AST 23 15 - 41 U/L   ALT 17 0 - 44 U/L   Alkaline Phosphatase 45 38 - 126 U/L   Total Bilirubin 0.3 0.3 - 1.2 mg/dL   GFR, Estimated >04>60 >54>60 mL/min   Anion gap 10 5 - 15  CBC with Differential  Result Value Ref Range   WBC 9.6 4.0 - 10.5 K/uL   RBC 4.72 3.87 - 5.11 MIL/uL   Hemoglobin 13.1 12.0 - 15.0 g/dL   HCT 09.840.6 36 - 46 %   MCV 86.0 80.0 - 100.0 fL   MCH 27.8 26.0 - 34.0 pg   MCHC 32.3 30.0 - 36.0 g/dL   RDW 11.916.8 (H) 14.711.5 - 82.915.5 %   Platelets 401 (H) 150 - 400 K/uL   nRBC 0.0 0.0 - 0.2 %   Neutrophils Relative % 78 %   Neutro Abs 7.4 1.7 - 7.7 K/uL   Lymphocytes Relative 16 %   Lymphs Abs 1.5 0.7 - 4.0 K/uL   Monocytes Relative 5 %   Monocytes Absolute 0.5 0.1 - 1.0 K/uL   Eosinophils Relative 1 %   Eosinophils Absolute 0.1 0.0 - 0.5 K/uL   Basophils Relative 0 %   Basophils Absolute 0.0 0.0 - 0.1 K/uL   Immature Granulocytes 0 %   Abs Immature Granulocytes 0.04 0.00 - 0.07 K/uL  D-dimer, quantitative  Result Value Ref  Range   D-Dimer, Quant 0.27 0.00 - 0.50 ug/mL-FEU  Troponin I (High Sensitivity)  Result Value Ref Range   Troponin I (High Sensitivity) 4 <18 ng/L  Troponin I (High Sensitivity)  Result Value Ref Range   Troponin I (High Sensitivity) 4 <18 ng/L    Laboratory interpretation all normal except hypokalemia   EKG EKG Interpretation  Date/Time:  Monday January 29 2020 01:12:34 EST Ventricular Rate:  62 PR Interval:    QRS Duration: 125 QT Interval:  448 QTC Calculation: 455 R Axis:   41 Text Interpretation: Sinus rhythm Borderline short PR interval Nonspecific intraventricular conduction delay No significant change since last tracing 03 Apr 2018 Confirmed by Devoria AlbeKnapp, Deantae Shackleton ([id number]) on 01/29/2020 1:29:05 AM   Radiology DG Chest Port 1 View  Result Date: 01/29/2020 CLINICAL DATA:  Chest pain EXAM: PORTABLE CHEST 1 VIEW COMPARISON:  None. FINDINGS: The heart size and mediastinal contours are within normal limits. Both lungs are clear. The visualized skeletal structures are unremarkable. IMPRESSION: No active disease. Electronically Signed   By: Heywood BeneBindu  Avutu M.D.  On: 01/29/2020 02:44    CT Renal Stone Study  Result Date: 01/10/2020 CLINICAL DATA:  Left flank pain and nausea for 6 days . IMPRESSION: 1. Obstructing 6 x 4 mm stone at the left UVJ results in moderate left hydroureteronephrosis. 2. Cholelithiasis without evidence of acute cholecystitis. Electronically Signed   By: Duanne Guess D.O.   On: 01/10/2020 10:10    Procedures Procedures (including critical care time)  Medications Ordered in ED Medications  nitroGLYCERIN (NITROGLYN) 2 % ointment 1 inch (1 inch Topical Given 01/29/20 0233)  acetaminophen (TYLENOL) tablet 1,000 mg (1,000 mg Oral Given 01/29/20 0233)  aspirin tablet 325 mg (325 mg Oral Given 01/29/20 0233)  ondansetron (ZOFRAN-ODT) disintegrating tablet 4 mg (4 mg Oral Given 01/29/20 0243)  methocarbamol (ROBAXIN) tablet 500 mg (500 mg Oral Given 01/29/20 0339)   ketorolac (TORADOL) 30 MG/ML injection 30 mg (30 mg Intramuscular Given 01/29/20 0339)  potassium chloride SA (KLOR-CON) CR tablet 40 mEq (40 mEq Oral Given 01/29/20 0342)    ED Course  I have reviewed the triage vital signs and the nursing notes.  Pertinent labs & imaging results that were available during my care of the patient were reviewed by me and considered in my medical decision making (see chart for details).    MDM Rules/Calculators/A&P                         Patient had nitroglycerin ointment with acetaminophen for expected nitroglycerin headache given for pain and she was given Zofran for nausea.  She also was given aspirin in case she has a cardiac chest pain.  Patient's initial troponin is negative.  Her D-dimer is normal.  Patient's hypokalemia was treated with oral potassium.  3:30 AM nurse reports her pain is better but it still 4.  She was given Toradol IM and oral Robaxin.  Patient's delta troponin is negative.  Recheck at 6:15 AM patient states her pain is better.  We discussed her test results which were all normal and reassuring.  She is going to be discharged home.   Final Clinical Impression(s) / ED Diagnoses Final diagnoses:  Atypical chest pain  Hypokalemia    Rx / DC Orders ED Discharge Orders         Ordered    naproxen (NAPROSYN) 500 MG tablet        01/29/20 0623    methocarbamol (ROBAXIN) 750 MG tablet        01/29/20 9326         Plan discharge  Devoria Albe, MD, Concha Pyo, MD 01/29/20 [phone]

## 2020-01-29 NOTE — ED Notes (Signed)
Nitro ointment removed

## 2020-01-29 NOTE — ED Triage Notes (Signed)
Pt presents to ED c/o chest pain that started about an hour ago. Pt says pain woke her up. Chest pain located under left breast and left shoulder blade. Pt also reports nausea.

## 2020-02-06 ENCOUNTER — Other Ambulatory Visit: Payer: Self-pay | Admitting: Urology

## 2020-02-06 DIAGNOSIS — N390 Urinary tract infection, site not specified: Secondary | ICD-10-CM

## 2020-02-12 ENCOUNTER — Ambulatory Visit (HOSPITAL_COMMUNITY)
Admission: RE | Admit: 2020-02-12 | Discharge: 2020-02-12 | Disposition: A | Discharge status: Home / Self Care | Payer: BC Managed Care – PPO | Source: Ambulatory Visit | Attending: Urology | Admitting: Urology

## 2020-02-12 ENCOUNTER — Other Ambulatory Visit: Payer: Self-pay

## 2020-02-12 DIAGNOSIS — N200 Calculus of kidney: Secondary | ICD-10-CM | POA: Diagnosis not present

## 2020-02-15 ENCOUNTER — Ambulatory Visit (INDEPENDENT_AMBULATORY_CARE_PROVIDER_SITE_OTHER): Payer: BC Managed Care – PPO | Admitting: Urology

## 2020-02-15 ENCOUNTER — Other Ambulatory Visit: Payer: Self-pay

## 2020-02-15 VITALS — BP 151/83 | HR 77 | Temp 98.9°F | Ht 60.0 in | Wt 180.0 lb

## 2020-02-15 DIAGNOSIS — N390 Urinary tract infection, site not specified: Secondary | ICD-10-CM | POA: Diagnosis not present

## 2020-02-15 DIAGNOSIS — N200 Calculus of kidney: Secondary | ICD-10-CM

## 2020-02-15 LAB — URINALYSIS, ROUTINE W REFLEX MICROSCOPIC
Bilirubin, UA: NEGATIVE
Glucose, UA: NEGATIVE
Nitrite, UA: NEGATIVE
Protein,UA: NEGATIVE
RBC, UA: NEGATIVE
Specific Gravity, UA: 1.025 (ref 1.005–1.030)
Urobilinogen, Ur: 0.2 mg/dL (ref 0.2–1.0)
pH, UA: 6.5 (ref 5.0–7.5)

## 2020-02-15 LAB — MICROSCOPIC EXAMINATION
Epithelial Cells (non renal): >10 /hpf — AB (ref 0–10)
RBC, Urine: NONE SEEN /hpf (ref 0–2)
Renal Epithel, UA: NONE SEEN /hpf

## 2020-02-15 NOTE — Progress Notes (Signed)
02/15/2020 10:00 AM   [name] Nov 09, 1980 [id number]  Referring provider: No referring provider defined for this encounter.  followup recurrent UTI and nephrolithiasis  HPI: [name] is a 39yo here for followup for recurrent UTI and nephrolithiasis. She underwetn ureteroscopic stone extraction last week and removed her stent POD#3. She denies any flank pain. NO stone events since last visit. NO significant LUTS. This was her first stone event.    PMH: Past Medical History:  Diagnosis Date  . Abnormal pap   . Anemia 10/20/2019   Add iron  . Anxiety   . Depression   . Elevated triglycerides with high cholesterol 10/20/2019   Try lifestyle modifications and consider meds too  . GERD (gastroesophageal reflux disease)   . Hypertension   . Vaginal Pap smear, abnormal     Surgical History: Past Surgical History:  Procedure Laterality Date  . CESAREAN SECTION    . CYSTOSCOPY W/ URETERAL STENT PLACEMENT Left 01/11/2020   Procedure: CYSTOSCOPY WITH RETROGRADE PYELOGRAM/URETERAL STENT PLACEMENT;  Surgeon: Malen Gauze, MD;  Location: [facility];  Service: Urology;  Laterality: Left;  . ectopic pregnancies    . HOLMIUM LASER APPLICATION Left 01/11/2020   Procedure: HOLMIUM LASER APPLICATION-ureteroscopy;  Surgeon: Malen Gauze, MD;  Location: [facility];  Service: Urology;  Laterality: Left;  . left fallopian tube removal    . right tube removed     history of ectopic pregnancies    Home Medications:  Allergies as of 02/15/2020      Reactions   Augmentin [amoxicillin-pot Clavulanate] Other (See Comments)   Abdominal Pain      Medication List       Accurate as of February 15, 2020 10:00 AM. If you have any questions, ask your nurse or doctor.        ferrous sulfate 324 MG Tbec Take 324 mg by mouth.   hydrochlorothiazide 12.5 MG capsule Commonly known as: MICROZIDE Take 1 capsule (12.5 mg total) by mouth daily.   ketorolac 10 MG tablet Commonly known  as: TORADOL Take 1 tablet (10 mg total) by mouth every 6 (six) hours as needed.   methocarbamol 750 MG tablet Commonly known as: ROBAXIN Take 1 or 2 po Q 6hrs for pain   multivitamin tablet Take 1 tablet by mouth daily.   naproxen 500 MG tablet Commonly known as: NAPROSYN Take 1 po BID with food prn pain   ondansetron 4 MG tablet Commonly known as: ZOFRAN Take 1 tablet (4 mg total) by mouth every 6 (six) hours as needed for nausea.       Allergies:  Allergies  Allergen Reactions  . Augmentin [Amoxicillin-Pot Clavulanate] Other (See Comments)    Abdominal Pain    Family History: Family History  Problem Relation Age of Onset  . Hypertension Mother   . Cancer Maternal Grandmother        colon cancer  . Cancer Maternal Grandfather        colon cancer    Social History:  reports that she has never smoked. She has never used smokeless tobacco. She reports previous drug use. Drugs: Benzodiazepines and Marijuana. She reports that she does not drink alcohol.  ROS: All other review of systems were reviewed and are negative except what is noted above in HPI  Physical Exam: BP (!) 151/83   Pulse 77   Temp 98.9 F (37.2 C)   Ht 5' (1.524 m)   Wt 180 lb (81.6 kg)   BMI 35.15 kg/m  Constitutional:  Alert and oriented, No acute distress. HEENT: [place] AT, moist mucus membranes.  Trachea midline, no masses. Cardiovascular: No clubbing, cyanosis, or edema. Respiratory: Normal respiratory effort, no increased work of breathing. GI: Abdomen is soft, nontender, nondistended, no abdominal masses GU: No CVA tenderness.  Lymph: No cervical or inguinal lymphadenopathy. Skin: No rashes, bruises or suspicious lesions. Neurologic: Grossly intact, no focal deficits, moving all 4 extremities. Psychiatric: Normal mood and affect.  Laboratory Data: Lab Results  Component Value Date   WBC 9.6 01/29/2020   HGB 13.1 01/29/2020   HCT 40.6 01/29/2020   MCV 86.0 01/29/2020   PLT 401 (H)  01/29/2020    Lab Results  Component Value Date   CREATININE 0.85 01/29/2020    No results found for: PSA  No results found for: TESTOSTERONE  No results found for: HGBA1C  Urinalysis    Component Value Date/Time   COLORURINE AMBER (A) 01/10/2020 0954   APPEARANCEUR Turbid (A) 01/17/2020 1316   LABSPEC 1.014 01/10/2020 0954   PHURINE 6.0 01/10/2020 0954   GLUCOSEU Trace (A) 01/17/2020 1316   HGBUR LARGE (A) 01/10/2020 0954   BILIRUBINUR Negative 01/17/2020 1316   KETONESUR NEGATIVE 01/10/2020 0954   PROTEINUR 3+ (A) 01/17/2020 1316   PROTEINUR 100 (A) 01/10/2020 0954   NITRITE Negative 01/17/2020 1316   NITRITE POSITIVE (A) 01/10/2020 0954   LEUKOCYTESUR 3+ (A) 01/17/2020 1316   LEUKOCYTESUR NEGATIVE 01/10/2020 0954    Lab Results  Component Value Date   LABMICR See below: 01/17/2020   WBCUA None seen 01/17/2020   LABEPIT 0-10 01/17/2020   BACTERIA Many (A) 01/17/2020    Pertinent Imaging:  No results found for this or any previous visit.  No results found for this or any previous visit.  No results found for this or any previous visit.  No results found for this or any previous visit.  Results for orders placed during the hospital encounter of 02/12/20  Ultrasound renal complete  Narrative CLINICAL DATA:  Initial evaluation for nephrolithiasis. History of recent stone removal and stenting.  EXAM: RENAL / URINARY TRACT ULTRASOUND COMPLETE  COMPARISON:  Prior CT from 01/10/2020  FINDINGS: Right Kidney:  Renal measurements: 11.2 x 3.8 x 6.1 cm = volume: 135.6 mL. Renal echogenicity within normal limits. No nephrolithiasis or hydronephrosis. No focal renal mass.  Left Kidney:  Renal measurements: 11.6 x 5.1 x 5.7 cm = volume: 177.8 mL. Renal echogenicity within normal limits. No nephrolithiasis or hydronephrosis. No focal renal mass.  Bladder:  Appears normal for degree of bladder distention. Bilateral jets  are visualized.  Other:  None.  IMPRESSION: Normal renal ultrasound. No nephrolithiasis or hydronephrosis.   Electronically Signed By: Rise Mu M.D. On: 02/12/2020 19:01  No results found for this or any previous visit.  No results found for this or any previous visit.  Results for orders placed during the hospital encounter of 01/10/20  CT Renal Stone Study  Narrative CLINICAL DATA:  Left flank pain and nausea for 6 days  EXAM: CT ABDOMEN AND PELVIS WITHOUT CONTRAST  TECHNIQUE: Multidetector CT imaging of the abdomen and pelvis was performed following the standard protocol without IV contrast.  COMPARISON:  08/01/2015  FINDINGS: Lower chest: Mild left basilar atelectasis.  Heart size is normal.  Hepatobiliary: Unremarkable unenhanced appearance of the liver. No focal liver lesion identified. Gallbladder within normal limits. Two peripherally calcified stones within the gallbladder lumen each measuring approximately 1.9 cm. No pericholecystic inflammatory changes by CT. No biliary dilatation.  Pancreas: Unremarkable. No pancreatic ductal dilatation or surrounding inflammatory changes.  Spleen: Normal in size without focal abnormality.  Adrenals/Urinary Tract: 6 x 4 mm stone located at the left ureterovesical junction results in moderate left hydroureteronephrosis with left perinephric stranding. No left renal stone. Right kidney and ureter are normal in appearance. Urinary bladder is partially decompressed. Unremarkable adrenal glands.  Stomach/Bowel: Stomach is within normal limits. Appendix appears normal (series 6, image 64). No evidence of bowel wall thickening, distention, or inflammatory changes.  Vascular/Lymphatic: No significant vascular findings are evident on non contrasted study. No enlarged abdominal or pelvic lymph nodes.  Reproductive: Uterus and bilateral adnexa are unremarkable.  Other: No free fluid. No abdominopelvic fluid  collection. No pneumoperitoneum. No abdominal wall hernia.  Musculoskeletal: No acute or significant osseous findings.  IMPRESSION: 1. Obstructing 6 x 4 mm stone at the left UVJ results in moderate left hydroureteronephrosis. 2. Cholelithiasis without evidence of acute cholecystitis.   Electronically Signed By: Duanne Guess D.O. On: 01/10/2020 10:10   Assessment & Plan:    1. Recurrent UTI -urine culture  2. Nephrolithiasis -Dietary handout given. RTC 6 months with K[facility]   No follow-ups on file.  Wilkie Aye, MD  [facility] [place]

## 2020-02-15 NOTE — Progress Notes (Signed)

## 2020-02-17 LAB — URINE CULTURE

## 2020-02-20 ENCOUNTER — Encounter: Payer: Self-pay | Admitting: Urology

## 2020-02-20 NOTE — Patient Instructions (Signed)
Dietary Guidelines to Help Prevent Kidney Stones Kidney stones are deposits of minerals and salts that form inside your kidneys. Your risk of developing kidney stones may be greater depending on your diet, your lifestyle, the medicines you take, and whether you have certain medical conditions. Most people can reduce their chances of developing kidney stones by following the instructions below. Depending on your overall health and the type of kidney stones you tend to develop, your dietitian may give you more specific instructions. What are tips for following this plan? Reading food labels  Choose foods with "no salt added" or "low-salt" labels. Limit your sodium intake to less than 1500 mg per day.  Choose foods with calcium for each meal and snack. Try to eat about 300 mg of calcium at each meal. Foods that contain 200-500 mg of calcium per serving include: ? 8 oz (237 ml) of milk, fortified nondairy milk, and fortified fruit juice. ? 8 oz (237 ml) of kefir, yogurt, and soy yogurt. ? 4 oz (118 ml) of tofu. ? 1 oz of cheese. ? 1 cup (300 g) of dried figs. ? 1 cup (91 g) of cooked broccoli. ? 1-3 oz can of sardines or mackerel.  Most people need 1000 to 1500 mg of calcium each day. Talk to your dietitian about how much calcium is recommended for you. Shopping  Buy plenty of fresh fruits and vegetables. Most people do not need to avoid fruits and vegetables, even if they contain nutrients that may contribute to kidney stones.  When shopping for convenience foods, choose: ? Whole pieces of fruit. ? Premade salads with dressing on the side. ? Low-fat fruit and yogurt smoothies.  Avoid buying frozen meals or prepared deli foods.  Look for foods with live cultures, such as yogurt and kefir. Cooking  Do not add salt to food when cooking. Place a salt shaker on the table and allow each person to add his or her own salt to taste.  Use vegetable protein, such as beans, textured vegetable  protein (TVP), or tofu instead of meat in pasta, casseroles, and soups. Meal planning   Eat less salt, if told by your dietitian. To do this: ? Avoid eating processed or premade food. ? Avoid eating fast food.  Eat less animal protein, including cheese, meat, poultry, or fish, if told by your dietitian. To do this: ? Limit the number of times you have meat, poultry, fish, or cheese each week. Eat a diet free of meat at least 2 days a week. ? Eat only one serving each day of meat, poultry, fish, or seafood. ? When you prepare animal protein, cut pieces into small portion sizes. For most meat and fish, one serving is about the size of one deck of cards.  Eat at least 5 servings of fresh fruits and vegetables each day. To do this: ? Keep fruits and vegetables on hand for snacks. ? Eat 1 piece of fruit or a handful of berries with breakfast. ? Have a salad and fruit at lunch. ? Have two kinds of vegetables at dinner.  Limit foods that are high in a substance called oxalate. These include: ? Spinach. ? Rhubarb. ? Beets. ? Potato chips and french fries. ? Nuts.  If you regularly take a diuretic medicine, make sure to eat at least 1-2 fruits or vegetables high in potassium each day. These include: ? Avocado. ? Banana. ? Orange, prune, carrot, or tomato juice. ? Baked potato. ? Cabbage. ? Beans and split   peas. General instructions   Drink enough fluid to keep your urine clear or pale yellow. This is the most important thing you can do.  Talk to your health care provider and dietitian about taking daily supplements. Depending on your health and the cause of your kidney stones, you may be advised: ? Not to take supplements with vitamin C. ? To take a calcium supplement. ? To take a daily probiotic supplement. ? To take other supplements such as magnesium, fish oil, or vitamin B6.  Take all medicines and supplements as told by your health care provider.  Limit alcohol intake to no  more than 1 drink a day for nonpregnant women and 2 drinks a day for men. One drink equals 12 oz of beer, 5 oz of wine, or 1 oz of hard liquor.  Lose weight if told by your health care provider. Work with your dietitian to find strategies and an eating plan that works best for you. What foods are not recommended? Limit your intake of the following foods, or as told by your dietitian. Talk to your dietitian about specific foods you should avoid based on the type of kidney stones and your overall health. Grains Breads. Bagels. Rolls. Baked goods. Salted crackers. Cereal. Pasta. Vegetables Spinach. Rhubarb. Beets. Canned vegetables. Pickles. Olives. Meats and other protein foods Nuts. Nut butters. Large portions of meat, poultry, or fish. Salted or cured meats. Deli meats. Hot dogs. Sausages. Dairy Cheese. Beverages Regular soft drinks. Regular vegetable juice. Seasonings and other foods Seasoning blends with salt. Salad dressings. Canned soups. Soy sauce. Ketchup. Barbecue sauce. Canned pasta sauce. Casseroles. Pizza. Lasagna. Frozen meals. Potato chips. French fries. Summary  You can reduce your risk of kidney stones by making changes to your diet.  The most important thing you can do is drink enough fluid. You should drink enough fluid to keep your urine clear or pale yellow.  Ask your health care provider or dietitian how much protein from animal sources you should eat each day, and also how much salt and calcium you should have each day. This information is not intended to replace advice given to you by your health care provider. Make sure you discuss any questions you have with your health care provider. Document Revised: 06/01/2018 Document Reviewed: 01/21/2016 Elsevier Patient Education  2020 [employer]  

## 2020-04-09 ENCOUNTER — Other Ambulatory Visit: Payer: Self-pay

## 2020-04-09 ENCOUNTER — Emergency Department (HOSPITAL_COMMUNITY)
Admission: EM | Admit: 2020-04-09 | Discharge: 2020-04-09 | Disposition: A | Discharge status: Home / Self Care | Payer: BC Managed Care – PPO | Attending: Emergency Medicine | Admitting: Emergency Medicine

## 2020-04-09 ENCOUNTER — Emergency Department (HOSPITAL_COMMUNITY): Payer: BC Managed Care – PPO

## 2020-04-09 ENCOUNTER — Encounter (HOSPITAL_COMMUNITY): Payer: Self-pay

## 2020-04-09 DIAGNOSIS — S92354A Nondisplaced fracture of fifth metatarsal bone, right foot, initial encounter for closed fracture: Secondary | ICD-10-CM | POA: Diagnosis not present

## 2020-04-09 DIAGNOSIS — I10 Essential (primary) hypertension: Secondary | ICD-10-CM | POA: Insufficient documentation

## 2020-04-09 DIAGNOSIS — Y9351 Activity, roller skating (inline) and skateboarding: Secondary | ICD-10-CM | POA: Diagnosis not present

## 2020-04-09 DIAGNOSIS — S92355A Nondisplaced fracture of fifth metatarsal bone, left foot, initial encounter for closed fracture: Secondary | ICD-10-CM

## 2020-04-09 DIAGNOSIS — S99921A Unspecified injury of right foot, initial encounter: Secondary | ICD-10-CM | POA: Diagnosis present

## 2020-04-09 MED ORDER — IBUPROFEN 800 MG PO TABS: 800.0000 mg | ORAL_TABLET | Freq: Three times a day (TID) | ORAL | 0 refills | Status: DC

## 2020-04-09 NOTE — ED Provider Notes (Signed)
[facility] EMERGENCY DEPARTMENT Provider Note   CSN: [id number] Arrival date & time: 04/09/20  4098     History Chief Complaint  Patient presents with  . Pain    Right foot    [name] is a 40 y.o. female.  HPI 40 year old female with a history of GERD, hypertension, depression, opioid abuse presents to the ER with complaints of pain to her right foot.  Patient states that she was on her daughter's hover board approximately 3 weeks ago when she fell off of it.  She had some pain to the right foot, elevated for several days and felt like it was getting better.  She then resumed normal activities and has been walking on it.  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  She has been taking ibuprofen for pain.  Denies any numbness or tingling.  Has been able to bear weight but with pain.  Has not noticed any discoloration, redness or swelling.    Past Medical History:  Diagnosis Date  . Abnormal pap   . Anemia 10/20/2019   Add iron  . Anxiety   . Depression   . Elevated triglycerides with high cholesterol 10/20/2019   Try lifestyle modifications and consider meds too  . GERD (gastroesophageal reflux disease)   . Hypertension   . Vaginal Pap smear, abnormal     Patient Active Problem List   Diagnosis Date Noted  . Hydroureteronephrosis 01/10/2020  . Elevated triglycerides with high cholesterol 10/20/2019  . Anemia 10/20/2019  . Dyspareunia, female 10/18/2019  . Encounter for gynecological examination with Papanicolaou smear of cervix 10/18/2019  . Frequent headaches 10/18/2019  . Tired 10/18/2019  . Elevated BP without diagnosis of hypertension 10/18/2019  . History of abnormal cervical Pap smear 10/18/2019  . 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 Gastroesophageal reflux disease without esophagitis 03/17/2016  . Unspecified gastritis and gastroduodenitis without mention  of hemorrhage 10/02/2013  . Depression with anxiety 06/22/2013    Past Surgical History:  Procedure Laterality Date  . CESAREAN SECTION    . CYSTOSCOPY W/ URETERAL STENT PLACEMENT Left 01/11/2020   Procedure: CYSTOSCOPY WITH RETROGRADE PYELOGRAM/URETERAL STENT PLACEMENT;  Surgeon: Malen Gauze, MD;  Location: [facility];  Service: Urology;  Laterality: Left;  . ectopic pregnancies    . HOLMIUM LASER APPLICATION Left 01/11/2020   Procedure: HOLMIUM LASER APPLICATION-ureteroscopy;  Surgeon: Malen Gauze, MD;  Location: [facility];  Service: Urology;  Laterality: Left;  . left fallopian tube removal    . right tube removed     history of ectopic pregnancies     OB History    Gravida  4   Para  1   Term  1   Preterm      AB  3   Living  1     SAB      IAB      Ectopic  3   Multiple      Live Births              Family History  Problem Relation Age of Onset  . Hypertension Mother   . Cancer Maternal Grandmother        colon cancer  . Cancer Maternal Grandfather        colon cancer    Social History   Tobacco Use  . Smoking status: Never Smoker  . Smokeless  tobacco: Never Used  Vaping Use  . Vaping Use: Never used  Substance Use Topics  . Alcohol use: No  . Drug use: Not Currently    Types: Benzodiazepines, Marijuana    Comment: opiates- Former    Home Medications Prior to Admission medications   Medication Sig Start Date End Date Taking? Authorizing Provider  ferrous sulfate 324 MG TBEC Take 324 mg by mouth.   Yes [provider]  hydrochlorothiazide (MICROZIDE) 12.5 MG capsule Take 1 capsule (12.5 mg total) by mouth daily. 10/18/19  Yes [name] A, NP  ibuprofen (ADVIL) 800 MG tablet Take 1 tablet (800 mg total) by mouth 3 (three) times daily. 04/09/20  Yes Mare Ferrari, PA-C  ketorolac (TORADOL) 10 MG tablet Take 1 tablet (10 mg total) by mouth every 6 (six) hours as needed. 01/11/20   Sherryll Burger, Pratik D, DO  methocarbamol  (ROBAXIN) 750 MG tablet Take 1 or 2 po Q 6hrs for pain Patient not taking: Reported on 04/09/2020 01/29/20   Devoria Albe, MD  naproxen (NAPROSYN) 500 MG tablet Take 1 po BID with food prn pain Patient not taking: Reported on 04/09/2020 01/29/20   Devoria Albe, MD  ondansetron (ZOFRAN) 4 MG tablet Take 1 tablet (4 mg total) by mouth every 6 (six) hours as needed for nausea. Patient not taking: Reported on 04/09/2020 01/11/20   Maurilio Lovely D, DO    Allergies    Augmentin [amoxicillin-pot clavulanate]  Review of Systems   Review of Systems  Constitutional: Negative for diaphoresis and fever.  Musculoskeletal: Positive for arthralgias and gait problem. Negative for joint swelling.  Neurological: Negative for weakness and numbness.    Physical Exam Updated Vital Signs BP (!) 153/94 (BP Location: Right Arm)   Pulse 89   Temp 98.4 F (36.9 C) (Oral)   Resp 16   Ht 5' (1.524 m)   Wt 81.6 kg   LMP 03/11/2020 (Approximate)   SpO2 98%   BMI 35.15 kg/m   Physical Exam Vitals and nursing note reviewed.  Constitutional:      General: She is not in acute distress.    Appearance: She is well-developed and well-nourished.  HENT:     Head: Normocephalic and atraumatic.  Eyes:     Conjunctiva/sclera: Conjunctivae normal.  Cardiovascular:     Rate and Rhythm: Normal rate and regular rhythm.     Heart sounds: No murmur heard.   Pulmonary:     Effort: Pulmonary effort is normal. No respiratory distress.     Breath sounds: Normal breath sounds.  Abdominal:     Palpations: Abdomen is soft.     Tenderness: There is no abdominal tenderness.  Musculoskeletal:        General: Tenderness present. No deformity, signs of injury or edema.     Cervical back: Neck supple.     Left lower leg: No edema.     Comments: Pain to palpation to the into webspace between the fifth and fourth digits.  Limited range of motion of the fourth and fifth digits.  No visible discoloration, redness, swelling,  deformities, no necrotic areas.<2 cap refill, neurovascularly intact.  Ambulated in the ED with mild limp.  Skin:    General: Skin is warm and dry.  Neurological:     Mental Status: She is alert.  Psychiatric:        Mood and Affect: Mood and affect normal.     ED Results / Procedures / Treatments   Labs (all labs ordered  are listed, but only abnormal results are displayed) Labs Reviewed - No data to display  EKG None  Radiology DG Foot Complete Right  Result Date: 04/09/2020 CLINICAL DATA:  Fall last week with foot pain. EXAM: RIGHT FOOT COMPLETE - 3+ VIEW COMPARISON:  None. FINDINGS: Fifth metatarsal fracture at the proximal shaft, nondisplaced. No dislocation. No acute soft tissue finding. Incidental heel spurs. IMPRESSION: Nondisplaced fracture of the fifth metatarsal shaft. Electronically Signed   By: Marnee Spring M.D.   On: 04/09/2020 09:33    Procedures Procedures   Medications Ordered in ED Medications - No data to display  ED Course  I have reviewed the triage vital signs and the nursing notes.  Pertinent labs & imaging results that were available during my care of the patient were reviewed by me and considered in my medical decision making (see chart for details).    MDM Rules/Calculators/A&P                          40 year old female presenting with several weeks of right foot pain after falling off a hover board.  Physical exam overall reassuring, neurovascularly intact.  Plain films confirm nondisplaced fifth metatarsal fracture.  No evidence of open fractures, wounds, neurovascularly intact.  Patient will be placed in a cam walker and provided crutches.  Instructed to be nonweightbearing.  We will prescribe 800 mg of ibuprofen.  Will refer to Ortho.  Discussed RICE protocol. Return precautions discussed.  She voiced understanding is agreeable.   Final Clinical Impression(s) / ED Diagnoses Final diagnoses:  Nondisplaced fracture of fifth metatarsal bone,  left foot, initial encounter for closed fracture    Rx / DC Orders ED Discharge Orders         Ordered    ibuprofen (ADVIL) 800 MG tablet  3 times daily        04/09/20 0939           Mare Ferrari, PA-C 04/09/20 0944    Pollyann Savoy, MD 04/09/20 1000

## 2020-04-09 NOTE — ED Triage Notes (Signed)
Reports she fell a couple weeks ago on her daughters hover board and hurt her right foot  Reports she is able to move her foot around but has discomfort Denies swelling, bruising  Reports it hurts to do full weight bearing

## 2020-04-09 NOTE — Discharge Instructions (Signed)
You were evaluated in the Emergency Department and after careful evaluation, we did not find any emergent condition requiring admission or further testing in the hospital.  Your x-rays did show that she fractured her fifth metatarsal.  Please make sure to wear the cam walker and avoid weightbearing.  Please keep your foot elevated, you may apply ice as needed.  I have prescribed 800 mg of ibuprofen, you may take this up to 3 times a day for no more than a week.  Please make sure to follow-up with orthopedics this contact information I provided in your discharge paperwork.  Please return to the Emergency Department if you experience any worsening of your condition.  We encourage you to follow up with a primary care provider.  Thank you for allowing [place] to be a part of your care.

## 2020-04-10 ENCOUNTER — Other Ambulatory Visit: Payer: Self-pay | Admitting: Adult Health

## 2020-04-10 MED ORDER — LISINOPRIL 10 MG PO TABS: 10.0000 mg | ORAL_TABLET | Freq: Every day | ORAL | 3 refills | Status: DC

## 2020-04-10 NOTE — Progress Notes (Signed)
Will add lisinopril 10 mg to microzide

## 2020-04-11 ENCOUNTER — Ambulatory Visit: Payer: BC Managed Care – PPO | Admitting: Orthopaedic Surgery

## 2020-05-08 ENCOUNTER — Encounter: Payer: Self-pay | Admitting: Adult Health

## 2020-05-08 ENCOUNTER — Other Ambulatory Visit: Payer: Self-pay

## 2020-05-08 ENCOUNTER — Ambulatory Visit (INDEPENDENT_AMBULATORY_CARE_PROVIDER_SITE_OTHER): Payer: BC Managed Care – PPO | Admitting: Adult Health

## 2020-05-08 VITALS — BP 134/83 | HR 78 | Ht 60.0 in | Wt 188.5 lb

## 2020-05-08 DIAGNOSIS — E785 Hyperlipidemia, unspecified: Secondary | ICD-10-CM | POA: Diagnosis not present

## 2020-05-08 DIAGNOSIS — I10 Essential (primary) hypertension: Secondary | ICD-10-CM | POA: Diagnosis not present

## 2020-05-08 MED ORDER — HYDROCHLOROTHIAZIDE 12.5 MG PO CAPS: 12.5000 mg | ORAL_CAPSULE | Freq: Every day | ORAL | 6 refills | Status: DC

## 2020-05-08 NOTE — Progress Notes (Signed)
  Subjective:     Patient ID: [name], female   DOB: [birth year], 40 y.o.   MRN: [id number]  HPI Corlene is a 40 year old white female,married, [id number] in for BP check. She stopped lisinopril after 1 week had headache. 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 She needs lipids checked too.    Review of Systems Had headache with lisinopril so stopped after 1 week Denies any chest pains Reviewed past medical,surgical, social and family history. Reviewed medications and allergies.     Objective:   Physical Exam BP 134/83 (BP Location: Left Arm, Patient Position: Sitting, Cuff Size: Normal)   Pulse 78   Ht 5' (1.524 m)   Wt 188 lb 8 oz (85.5 kg)   LMP 04/16/2020 (Approximate)   BMI 36.81 kg/m  Skin warm and dry.  Lungs: clear to ausculation bilaterally. Cardiovascular: regular rate and rhythm. Fall risk is moderate  Upstream - 05/08/20 1558      Pregnancy Intention Screening   Does the patient want to become pregnant in the next year? No    Does the patient's partner want to become pregnant in the next year? No    Would the patient like to discuss contraceptive options today? No      Contraception Wrap Up   Current Method Female Sterilization    End Method Female Sterilization    Contraception Counseling Provided No             Assessment:     1. Dyslipidemia Check lipids and CMP fasting, orders given  2. Hypertension, unspecified type Will refill microzide Meds ordered this encounter  Medications  . hydrochlorothiazide (MICROZIDE) 12.5 MG capsule    Sig: Take 1 capsule (12.5 mg total) by mouth daily.    Dispense:  30 capsule    Refill:  6    Order Specific Question:   Supervising Provider    Answer:   Lazaro Arms [[id number]]  Keep check on BP at home     Plan:     Follow up in 6 months

## 2020-08-21 ENCOUNTER — Ambulatory Visit: Payer: BC Managed Care – PPO | Admitting: Urology

## 2020-08-22 ENCOUNTER — Ambulatory Visit: Payer: BC Managed Care – PPO | Admitting: Urology

## 2021-01-06 ENCOUNTER — Other Ambulatory Visit: Payer: Self-pay | Admitting: *Deleted

## 2021-01-06 MED ORDER — HYDROCHLOROTHIAZIDE 12.5 MG PO CAPS: 12.5000 mg | ORAL_CAPSULE | Freq: Every day | ORAL | 6 refills | Status: DC

## 2021-06-04 ENCOUNTER — Emergency Department (HOSPITAL_COMMUNITY)
Admission: EM | Admit: 2021-06-04 | Discharge: 2021-06-04 | Discharge status: Against Medical Advice | Payer: BC Managed Care – PPO | Source: Home / Self Care

## 2021-09-17 IMAGING — US US RENAL
1 series · 14 of 25 positions shown · non-contrast
Comparison: Prior CT from 01/10/2020

CLINICAL DATA: Initial evaluation for nephrolithiasis. History of
recent stone removal and stenting.

EXAM:
RENAL / URINARY TRACT ULTRASOUND COMPLETE

[Series 1: us renal · 14 of 42 slices shown]
[im 1/42]
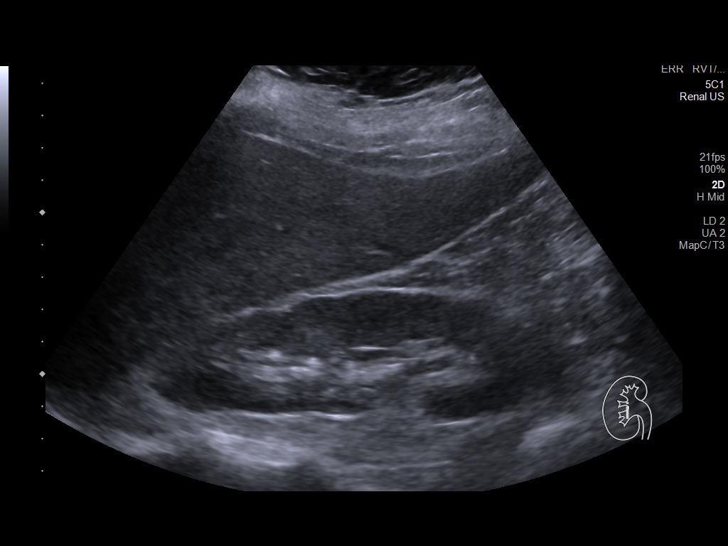
[im 4/42]
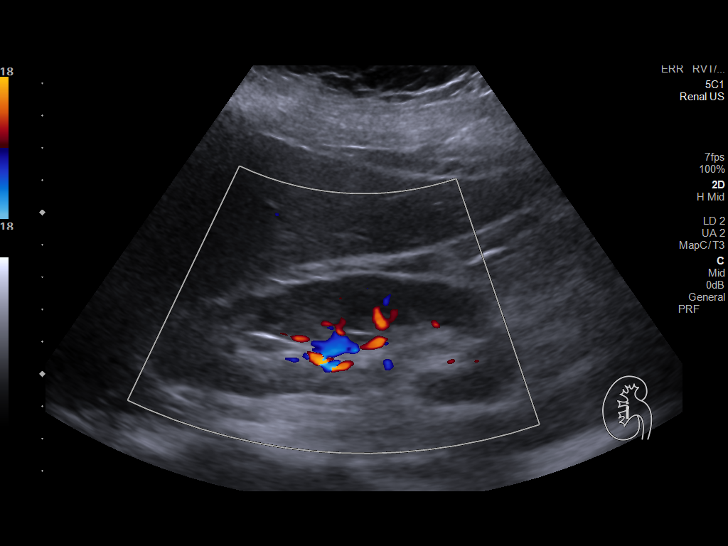
[im 7/42]
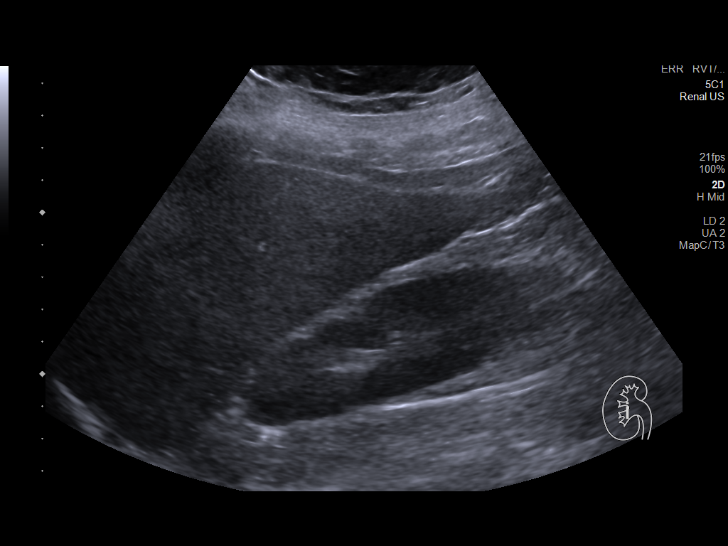
[im 11/42]
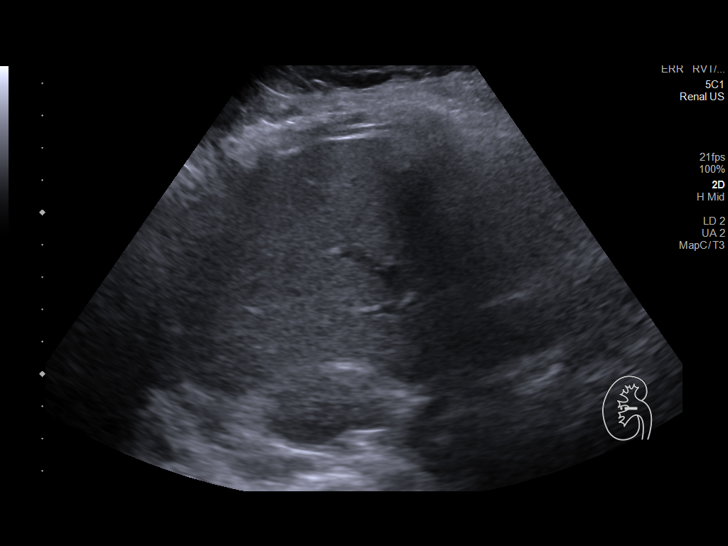
[im 14/42]
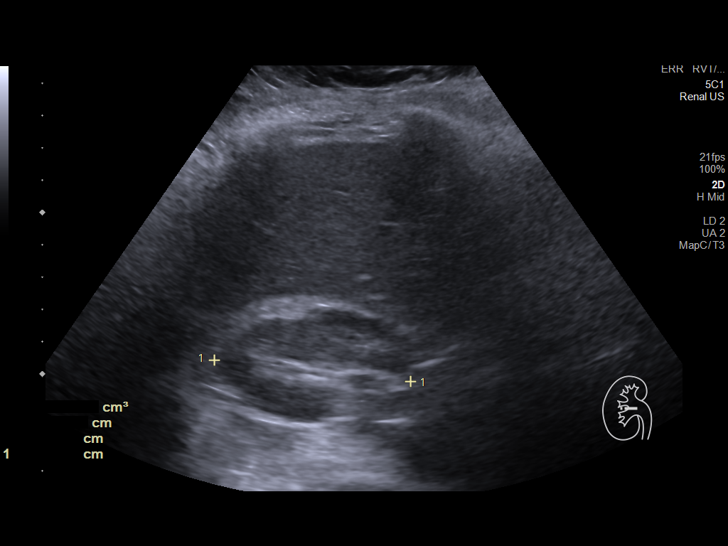
[im 16/42]
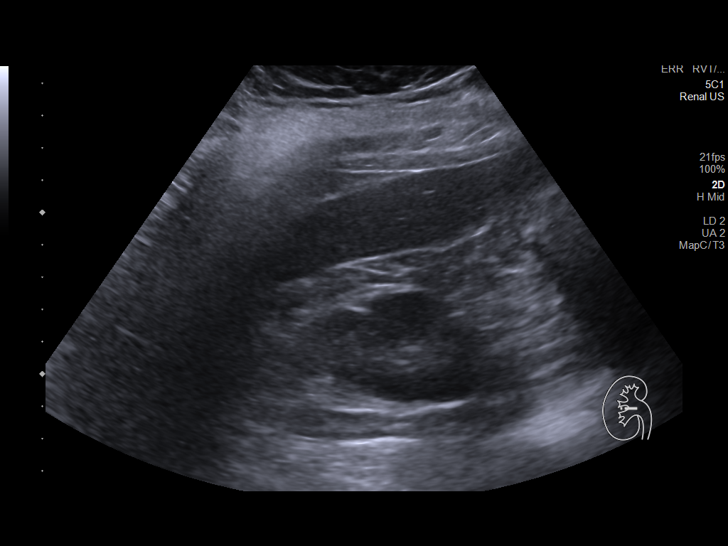
[im 19/42]
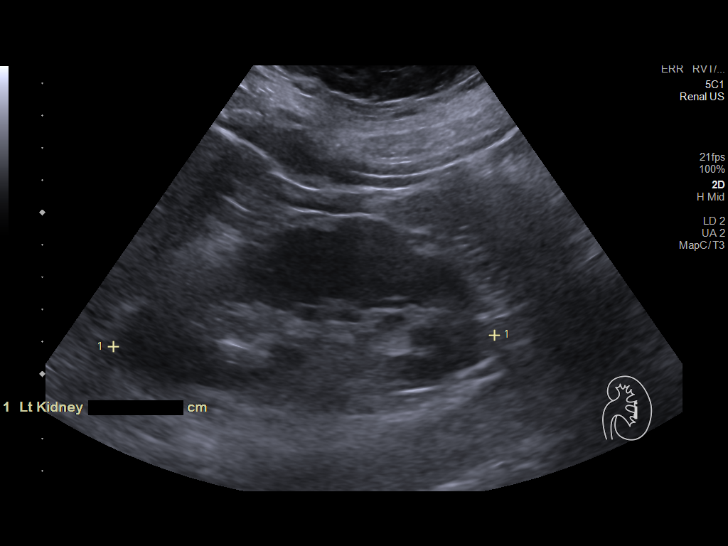
[im 23/42]
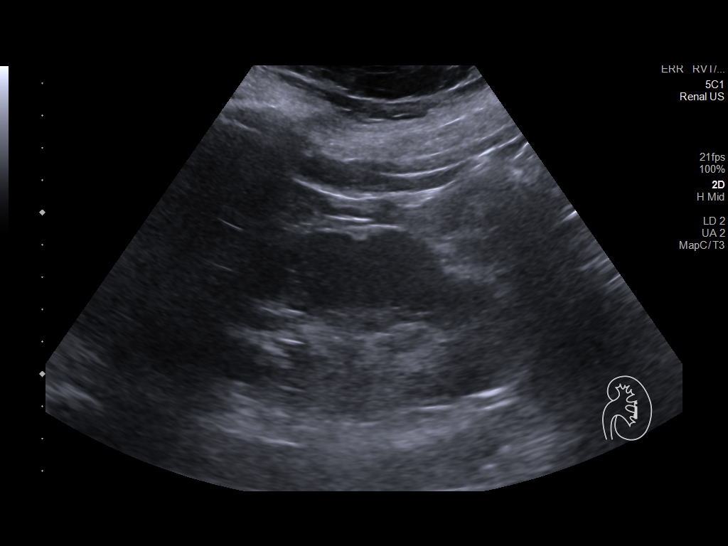
[im 26/42]
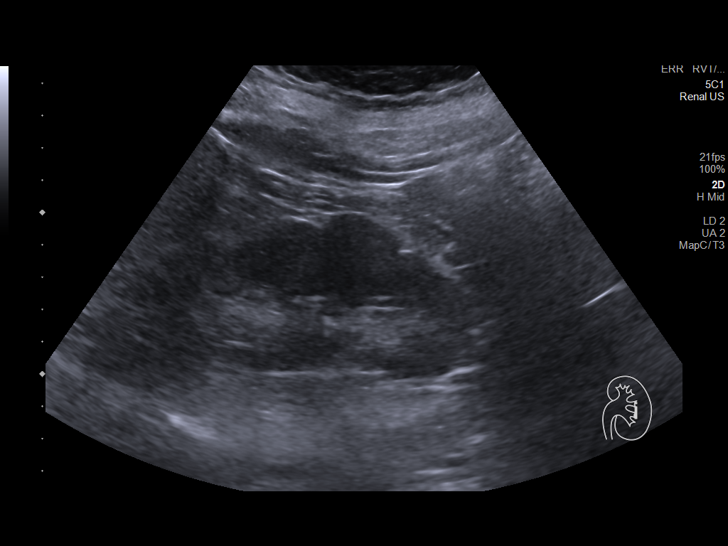
[im 28/42]
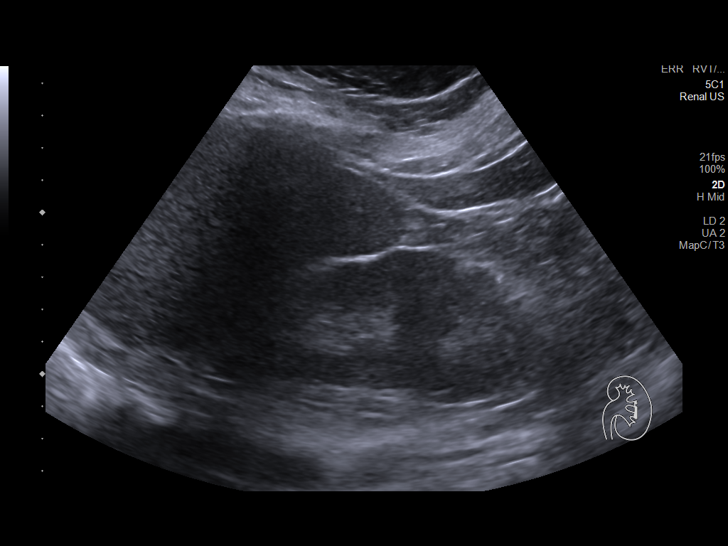
[im 31/42]
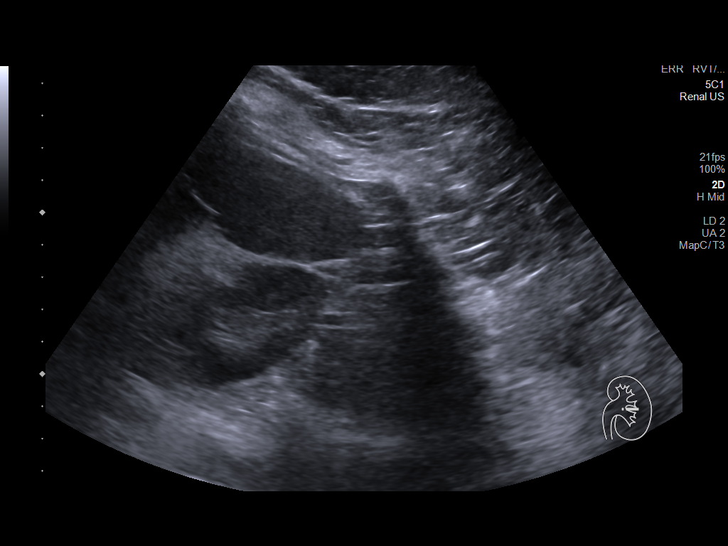
[im 35/42]
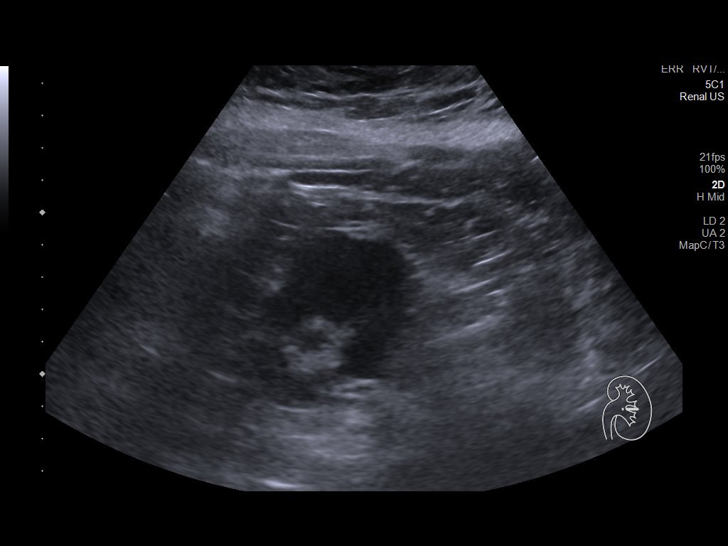
[im 38/42]
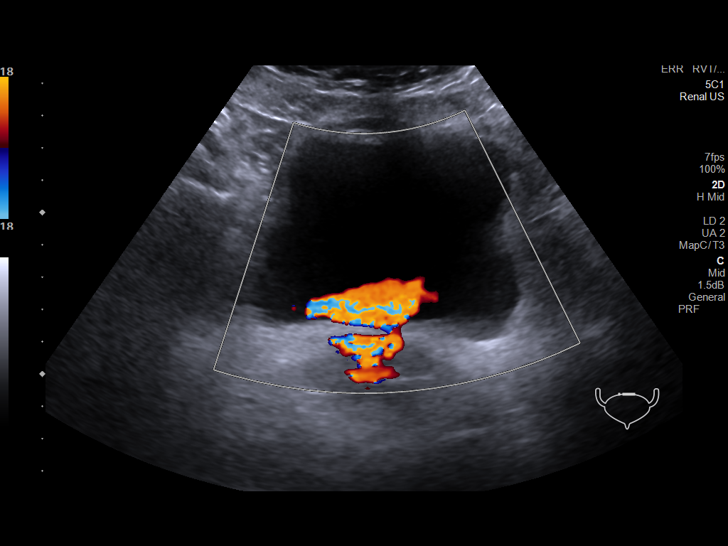
[im 42/42]
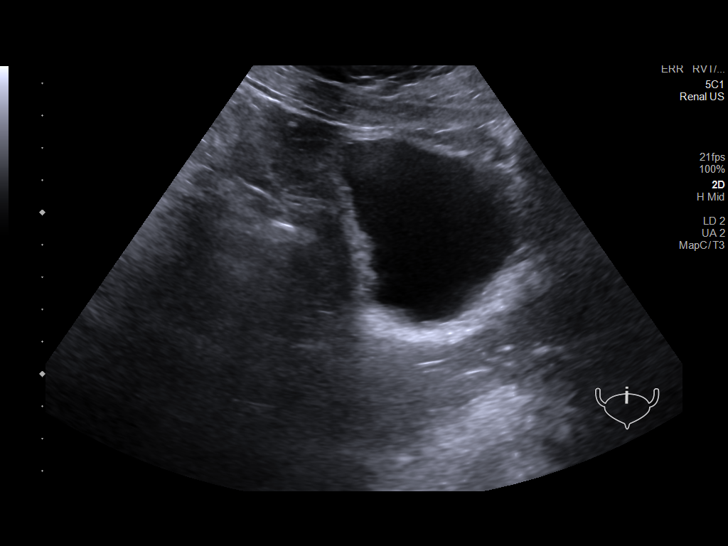

[14 of 25 positions shown; findings below may reference images not displayed]

FINDINGS: Right Kidney:

Renal measurements: 11.2 x 3.8 x 6.1 cm = volume: 135.6 mL. Renal
echogenicity within normal limits. No nephrolithiasis or
hydronephrosis. No focal renal mass.

Left Kidney:

Renal measurements: 11.6 x 5.1 x 5.7 cm = volume: 177.8 mL. Renal
echogenicity within normal limits. No nephrolithiasis or
hydronephrosis. No focal renal mass.

Bladder:

Appears normal for degree of bladder distention. Bilateral jets are
visualized.

Other:

None.
IMPRESSION: Normal renal ultrasound. No nephrolithiasis or hydronephrosis.

## 2021-11-01 ENCOUNTER — Other Ambulatory Visit: Payer: Self-pay | Admitting: Adult Health

## 2021-11-11 ENCOUNTER — Other Ambulatory Visit: Payer: Self-pay | Admitting: Adult Health

## 2021-11-13 ENCOUNTER — Ambulatory Visit: Payer: Self-pay | Admitting: Adult Health

## 2021-12-01 ENCOUNTER — Emergency Department (HOSPITAL_COMMUNITY): Admission: EM | Admit: 2021-12-01 | Discharge: 2021-12-01 | Discharge status: Against Medical Advice | Payer: Self-pay

## 2021-12-01 NOTE — ED Notes (Signed)
Pt called,no answer.

## 2022-03-11 ENCOUNTER — Emergency Department (HOSPITAL_COMMUNITY)
Admission: EM | Admit: 2022-03-11 | Discharge: 2022-03-11 | Discharge status: Against Medical Advice | Payer: Self-pay | Attending: Emergency Medicine | Admitting: Emergency Medicine

## 2022-03-11 DIAGNOSIS — R0981 Nasal congestion: Secondary | ICD-10-CM | POA: Insufficient documentation

## 2022-03-11 DIAGNOSIS — H9209 Otalgia, unspecified ear: Secondary | ICD-10-CM | POA: Insufficient documentation

## 2022-03-11 DIAGNOSIS — Z5321 Procedure and treatment not carried out due to patient leaving prior to being seen by health care provider: Secondary | ICD-10-CM | POA: Insufficient documentation

## 2022-03-11 NOTE — ED Notes (Signed)
Called for pt for treatment room, no answer- registration informs RN she thinks pt left lobby

## 2022-03-11 NOTE — ED Notes (Signed)
Called for pt to go to treatment room, no answer

## 2022-03-25 ENCOUNTER — Emergency Department (HOSPITAL_COMMUNITY)
Admission: EM | Admit: 2022-03-25 | Discharge: 2022-03-25 | Disposition: A | Discharge status: Home / Self Care | Payer: Self-pay | Attending: Emergency Medicine | Admitting: Emergency Medicine

## 2022-03-25 ENCOUNTER — Emergency Department (HOSPITAL_COMMUNITY): Payer: Self-pay

## 2022-03-25 ENCOUNTER — Other Ambulatory Visit: Payer: Self-pay

## 2022-03-25 DIAGNOSIS — I10 Essential (primary) hypertension: Secondary | ICD-10-CM | POA: Insufficient documentation

## 2022-03-25 DIAGNOSIS — E876 Hypokalemia: Secondary | ICD-10-CM | POA: Insufficient documentation

## 2022-03-25 DIAGNOSIS — R1012 Left upper quadrant pain: Secondary | ICD-10-CM | POA: Insufficient documentation

## 2022-03-25 DIAGNOSIS — Z79899 Other long term (current) drug therapy: Secondary | ICD-10-CM | POA: Insufficient documentation

## 2022-03-25 DIAGNOSIS — R0789 Other chest pain: Secondary | ICD-10-CM | POA: Insufficient documentation

## 2022-03-25 DIAGNOSIS — R079 Chest pain, unspecified: Secondary | ICD-10-CM

## 2022-03-25 LAB — COMPREHENSIVE METABOLIC PANEL
ALT: 19 U/L (ref 0–44)
AST: 28 U/L (ref 15–41)
Albumin: 4.3 g/dL (ref 3.5–5.0)
Alkaline Phosphatase: 44 U/L (ref 38–126)
Anion gap: 10 (ref 5–15)
BUN: 12 mg/dL (ref 6–20)
CO2: 25 mmol/L (ref 22–32)
Calcium: 8.8 mg/dL — ABNORMAL LOW (ref 8.9–10.3)
Chloride: 104 mmol/L (ref 98–111)
Creatinine, Ser: 0.74 mg/dL (ref 0.44–1.00)
GFR, Estimated: >60 mL/min (ref >60)
Glucose, Bld: 108 mg/dL — ABNORMAL HIGH (ref 70–99)
Potassium: 3 mmol/L — ABNORMAL LOW (ref 3.5–5.1)
Sodium: 139 mmol/L (ref 135–145)
Total Bilirubin: 0.5 mg/dL (ref 0.3–1.2)
Total Protein: 7.8 g/dL (ref 6.5–8.1)

## 2022-03-25 LAB — CBC
HCT: 46.8 % — ABNORMAL HIGH (ref 36.0–46.0)
Hemoglobin: 15.4 g/dL — ABNORMAL HIGH (ref 12.0–15.0)
MCH: 29.3 pg (ref 26.0–34.0)
MCHC: 32.9 g/dL (ref 30.0–36.0)
MCV: 89 fL (ref 80.0–100.0)
Platelets: 344 10*3/uL (ref 150–400)
RBC: 5.26 MIL/uL — ABNORMAL HIGH (ref 3.87–5.11)
RDW: 13.3 % (ref 11.5–15.5)
WBC: 7.1 10*3/uL (ref 4.0–10.5)
nRBC: 0 % (ref 0.0–0.2)

## 2022-03-25 LAB — HCG, QUANTITATIVE, PREGNANCY: hCG, Beta Chain, Quant, S: <1 m[IU]/mL (ref <5)

## 2022-03-25 LAB — TROPONIN I (HIGH SENSITIVITY)
Troponin I (High Sensitivity): 3 ng/L (ref <18)
Troponin I (High Sensitivity): <2 ng/L (ref <18)

## 2022-03-25 LAB — LIPASE, BLOOD: Lipase: 35 U/L (ref 11–51)

## 2022-03-25 MED ORDER — POTASSIUM CHLORIDE CRYS ER 20 MEQ PO TBCR
40.0000 meq | EXTENDED_RELEASE_TABLET | Freq: Once | ORAL | Status: AC
  Administered 2022-03-25: 40 meq via ORAL
  Filled 2022-03-25: qty 2

## 2022-03-25 MED ORDER — MORPHINE SULFATE (PF) 4 MG/ML IV SOLN
4.0000 mg | Freq: Once | INTRAVENOUS | Status: AC
  Administered 2022-03-25: 4 mg via INTRAVENOUS
  Filled 2022-03-25: qty 1

## 2022-03-25 MED ORDER — ASPIRIN 81 MG PO CHEW
324.0000 mg | CHEWABLE_TABLET | Freq: Once | ORAL | Status: AC
  Administered 2022-03-25: 324 mg via ORAL
  Filled 2022-03-25: qty 4

## 2022-03-25 MED ORDER — ONDANSETRON HCL 4 MG/2ML IJ SOLN
4.0000 mg | Freq: Once | INTRAMUSCULAR | Status: AC
  Administered 2022-03-25: 4 mg via INTRAVENOUS
  Filled 2022-03-25: qty 2

## 2022-03-25 MED ORDER — POTASSIUM CHLORIDE CRYS ER 20 MEQ PO TBCR: 20.0000 meq | EXTENDED_RELEASE_TABLET | Freq: Every day | ORAL | 0 refills | Status: DC

## 2022-03-25 MED ORDER — NITROGLYCERIN 0.4 MG SL SUBL
0.4000 mg | SUBLINGUAL_TABLET | SUBLINGUAL | Status: DC | PRN
  Filled 2022-03-25: qty 1

## 2022-03-25 NOTE — ED Notes (Signed)
Pt stated she did not want to take the SL nitroglycerin at this time

## 2022-03-25 NOTE — ED Provider Notes (Signed)
[facility] Provider Note   CSN: [id number] Arrival date & time: 03/25/22  5784     History  Chief Complaint  Patient presents with   Chest Pain    [name] is a 42 y.o. female with a history including hypertriglyceridemia, GERD, reported gallstones, hypertension presenting with a 1 day history of left chest pain described as pressure and a burning sensation which radiates into her left shoulder.  It started yesterday morning when she was driving to work and has been persistent since then.  Pain is not reproducible with movement, she denies shortness of breath or pleuritic pain, she has had nausea without vomiting.  There is no radiation of pain into her back.  She has had no palpitations, diaphoresis and has been able to tolerate p.o. intake, she last ate breakfast this morning which did not improve or worsen her symptoms.  She has taken ibuprofen and Tylenol without symptom relief.  She does not smoke, no family history of cardiac disease at an early age.  The history is provided by the patient.       Home Medications Prior to Admission medications   Medication Sig Start Date End Date Taking? Authorizing Provider  ferrous sulfate 324 MG TBEC Take 324 mg by mouth.   Yes [provider]  hydrochlorothiazide (MICROZIDE) 12.5 MG capsule Take 1 capsule by mouth once daily 11/03/21  Yes Derrek Monaco A, NP  potassium chloride SA (KLOR-CON M) 20 MEQ tablet Take 1 tablet (20 mEq total) by mouth daily. 03/25/22  Yes Jonas Goh, Almyra Free, PA-C      Allergies    Augmentin [amoxicillin-pot clavulanate]    Review of Systems   Review of Systems  Constitutional:  Negative for chills, diaphoresis and fever.  HENT:  Negative for congestion and sore throat.   Eyes: Negative.   Respiratory:  Negative for chest tightness and shortness of breath.   Cardiovascular:  Positive for chest pain. Negative for palpitations and leg swelling.   Gastrointestinal:  Positive for nausea. Negative for abdominal pain.  Genitourinary: Negative.   Musculoskeletal:  Negative for arthralgias, joint swelling and neck pain.  Skin: Negative.  Negative for rash and wound.  Neurological:  Negative for dizziness, weakness, light-headedness, numbness and headaches.  Psychiatric/Behavioral: Negative.    All other systems reviewed and are negative.   Physical Exam Updated Vital Signs BP 137/89   Pulse 79   Temp 98.2 F (36.8 C) (Oral)   Resp 18   Ht 5' (1.524 m)   Wt 81.6 kg   SpO2 97%   BMI 35.15 kg/m  Physical Exam Vitals and nursing note reviewed.  Constitutional:      Appearance: She is well-developed.  HENT:     Head: Normocephalic and atraumatic.  Eyes:     Conjunctiva/sclera: Conjunctivae normal.  Cardiovascular:     Rate and Rhythm: Normal rate and regular rhythm.     Heart sounds: Normal heart sounds.  Pulmonary:     Effort: Pulmonary effort is normal.     Breath sounds: Normal breath sounds. No wheezing or rhonchi.  Chest:     Chest wall: No tenderness.  Abdominal:     General: Bowel sounds are normal.     Palpations: Abdomen is soft. There is no hepatomegaly or splenomegaly.     Tenderness: There is abdominal tenderness in the left upper quadrant.     Comments: Patient has mild tenderness to deep palpation in her left upper quadrant.  No right upper quadrant tenderness.  Musculoskeletal:        General: Normal range of motion.     Cervical back: Normal range of motion.  Skin:    General: Skin is warm and dry.  Neurological:     Mental Status: She is alert.     ED Results / Procedures / Treatments   Labs (all labs ordered are listed, but only abnormal results are displayed) Labs Reviewed  CBC - Abnormal; Notable for the following components:      Result Value   RBC 5.26 (*)    Hemoglobin 15.4 (*)    HCT 46.8 (*)    All other components within normal limits  COMPREHENSIVE METABOLIC PANEL - Abnormal;  Notable for the following components:   Potassium 3.0 (*)    Glucose, Bld 108 (*)    Calcium 8.8 (*)    All other components within normal limits  LIPASE, BLOOD  HCG, QUANTITATIVE, PREGNANCY  TROPONIN I (HIGH SENSITIVITY)  TROPONIN I (HIGH SENSITIVITY)    EKG EKG Interpretation  Date/Time:  Wednesday March 25 2022 09:17:20 EST Ventricular Rate:  80 PR Interval:  119 QRS Duration: 118 QT Interval:  393 QTC Calculation: 454 R Axis:   52 Text Interpretation: Sinus rhythm Borderline short PR interval Incomplete right bundle branch block No significant change since last tracing Confirmed by Isla Pence [phone] on 03/25/2022 9:19:33 AM  Radiology DG Chest Port 1 View  Result Date: 03/25/2022 CLINICAL DATA:  Left-sided chest pain EXAM: PORTABLE CHEST 1 VIEW COMPARISON:  Chest radiograph dated 01/29/2020 FINDINGS: Low lung volumes. No focal consolidations. No pleural effusion or pneumothorax. Similar cardiomediastinal silhouette. The visualized skeletal structures are unremarkable. IMPRESSION: No active disease. Electronically Signed   By: Darrin Nipper M.D.   On: 03/25/2022 09:56    Procedures Procedures    Medications Ordered in ED Medications  nitroGLYCERIN (NITROSTAT) SL tablet 0.4 mg (has no administration in time range)  aspirin chewable tablet 324 mg (324 mg Oral Given 03/25/22 0942)  ondansetron (ZOFRAN) injection 4 mg (4 mg Intravenous Given 03/25/22 0943)  morphine (PF) 4 MG/ML injection 4 mg (4 mg Intravenous Given 03/25/22 1045)  potassium chloride SA (KLOR-CON M) CR tablet 40 mEq (40 mEq Oral Given 03/25/22 1240)    ED Course/ Medical Decision Making/ A&P                             Medical Decision Making Patient presenting with atypical chest pain since yesterday morning.  It is not exertional, she has a low risk profile, her heart score is low at 2, her her negative troponin is reassuring.  Delta troponin is not required at this time since symptoms have been  persistent since yesterday morning.  Differential diagnosis including ACS, pneumonia, GERD, atypical cholecystitis, pancreatitis.  Amount and/or Complexity of Data Reviewed Labs: ordered.    Details: Labs are reassuring, negative troponin, patient respiratory panel.  Her LFTs are normal, lipase is normal.  Ruling out pancreatitis or acute biliary component.  She was given a dose of morphine after which her symptoms completely resolved. Radiology: ordered and independent interpretation performed.    Details: Chest x-ray is negative, reviewed and agree with interpretation. ECG/medicine tests: ordered.    Details: EKG unchanged, rate 80, incomplete right bundle branch block.  Risk OTC drugs. Prescription drug management.           Final Clinical Impression(s) / ED Diagnoses Final diagnoses:  Chest pain, unspecified type  Hypokalemia    Rx / DC Orders ED Discharge Orders          Ordered    potassium chloride SA (KLOR-CON M) 20 MEQ tablet  Daily        03/25/22 1244    Ambulatory referral to Cardiology       Comments: If you have not heard from the Cardiology office within the next 72 hours please call [phone].   03/25/22 1244              Evalee Jefferson, PA-C 03/25/22 [place], Feige Lowdermilk, MD 03/26/22 [phone]

## 2022-03-25 NOTE — ED Triage Notes (Signed)
Pt c/o chest pain on left side of chest and sometimes through her back since yesterday some nausea noted.

## 2022-03-25 NOTE — Discharge Instructions (Signed)
As discussed, your lab test, EKG and exam are reassuring today.  However you may benefit from a cardiology consult to consider having an exercise stress test to confirm heart health.  You do have a low potassium today but this is not causing today's symptoms.  I have prescribed you some potassium for replacing this electrolyte.  Your hydrochlorothiazide can cause reduction in your body's potassium level.  You may want to consider either a daily over-the-counter potassium supplement while on the hydrochlorothiazide or adding potassium rich foods to your diet including bananas, orange juice, leafy green vegetables.  The cardiology office should reach out to you within the next 1 to 2 days to arrange an office visit with them.

## 2022-03-25 NOTE — ED Notes (Signed)
Provider at bedside during triage.

## 2022-04-05 ENCOUNTER — Other Ambulatory Visit: Payer: Self-pay

## 2022-04-05 ENCOUNTER — Encounter (HOSPITAL_COMMUNITY): Payer: Self-pay

## 2022-04-05 ENCOUNTER — Emergency Department (HOSPITAL_COMMUNITY): Payer: Self-pay

## 2022-04-05 ENCOUNTER — Emergency Department (HOSPITAL_COMMUNITY)
Admission: EM | Admit: 2022-04-05 | Discharge: 2022-04-05 | Disposition: A | Discharge status: Home / Self Care | Payer: Self-pay | Attending: Emergency Medicine | Admitting: Emergency Medicine

## 2022-04-05 DIAGNOSIS — S63501A Unspecified sprain of right wrist, initial encounter: Secondary | ICD-10-CM | POA: Insufficient documentation

## 2022-04-05 DIAGNOSIS — W010XXA Fall on same level from slipping, tripping and stumbling without subsequent striking against object, initial encounter: Secondary | ICD-10-CM | POA: Insufficient documentation

## 2022-04-05 DIAGNOSIS — M25531 Pain in right wrist: Secondary | ICD-10-CM | POA: Insufficient documentation

## 2022-04-05 NOTE — Discharge Instructions (Addendum)
Thank you for allowing me to be part of your care today.  Your x-ray was reassuring and showed no broken bones.  I suspect you have sprained your wrist which will take time to heal.  I recommend using 800 mg of ibuprofen every 6-8 hours as needed for swelling and pain.  You may also alternate this with Tylenol if needed.  Rest, ice, and elevation are also good for swelling and pain.  Activity with your wrist as tolerated.    I recommend following up with your primary care provider or [facility] should you continue to have pain despite treatment or if it worsens.

## 2022-04-05 NOTE — ED Provider Notes (Signed)
[facility] Provider Note   CSN: [id number] Arrival date & time: 04/05/22  1534     History  Chief Complaint  Patient presents with   Wrist Pain    [name] is a 42 y.o. female presents to the ED complaining of right wrist pain following a mechanical fall.  She states she tripped over her puppy and caught herself with her right wrist.  She states she fell to the floor, but did not hit her head and denies any other injury related to the fall.  She did not have significant pain initially after the fall, but it did begin hurting later especially when she tried to pick things up with her right hand.  She does not take blood thinners and does not have a history of osteoporosis or osteopenia.  She was hypertensive in triage and reports that she has not been taking her hypertension medication as prescribed.         Home Medications Prior to Admission medications   Medication Sig Start Date End Date Taking? Authorizing Provider  ferrous sulfate 324 MG TBEC Take 324 mg by mouth.    [provider]  hydrochlorothiazide (MICROZIDE) 12.5 MG capsule Take 1 capsule by mouth once daily 11/03/21   Derrek Monaco A, NP  potassium chloride SA (KLOR-CON M) 20 MEQ tablet Take 1 tablet (20 mEq total) by mouth daily. 03/25/22   Evalee Jefferson, PA-C      Allergies    Augmentin [amoxicillin-pot clavulanate]    Review of Systems   Review of Systems  Musculoskeletal:  Positive for arthralgias (right wrist pain). Negative for back pain, joint swelling and neck pain.    Physical Exam Updated Vital Signs BP (!) 189/94 (BP Location: Left Arm)   Pulse 74   Temp 99 F (37.2 C) (Oral)   Resp 16   Ht 5' (1.524 m)   Wt 81.6 kg   LMP 04/05/2022   SpO2 98%   BMI 35.15 kg/m  Physical Exam Vitals and nursing note reviewed.  Constitutional:      General: She is not in acute distress.    Appearance: Normal appearance. She is not ill-appearing  or diaphoretic.  Cardiovascular:     Rate and Rhythm: Normal rate and regular rhythm.  Pulmonary:     Effort: Pulmonary effort is normal.  Musculoskeletal:     Right wrist: Tenderness present. No swelling, deformity, bony tenderness or snuff box tenderness. Normal range of motion. Normal pulse.     Comments: Tenderness with radial deviation of the wrist and when moving her right thumb.  She is able to make a closed fist and extend all fingers normally.  All digits of the right hand are neurovascularly intact. Radial pulse is 2+.    Skin:    General: Skin is warm and dry.     Capillary Refill: Capillary refill takes less than 2 seconds.  Neurological:     Mental Status: She is alert. Mental status is at baseline.  Psychiatric:        Mood and Affect: Mood normal.        Behavior: Behavior normal.     ED Results / Procedures / Treatments   Labs (all labs ordered are listed, but only abnormal results are displayed) Labs Reviewed - No data to display  EKG None  Radiology DG Wrist Complete Right  Result Date: 04/05/2022 CLINICAL DATA:  Fall, wrist pain EXAM: RIGHT WRIST - COMPLETE 3+ VIEW COMPARISON:  None Available. FINDINGS: There is no evidence of fracture or dislocation. There is no evidence of arthropathy or other focal bone abnormality. Soft tissues are unremarkable. IMPRESSION: Negative. Electronically Signed   By: Jacqulynn Cadet M.D.   On: 04/05/2022 15:59    Procedures Procedures    Medications Ordered in ED Medications - No data to display  ED Course/ Medical Decision Making/ A&P                             Medical Decision Making Amount and/or Complexity of Data Reviewed Radiology: ordered.   This patient presents to the ED with chief complaint(s) of right wrist pain following a mechanical fall at home.  The complaint involves an extensive differential diagnosis and also carries with it a high risk of complications and morbidity.    The differential  diagnosis includes fracture or dislocation to the right wrist, wrist sprain   The initial plan is to obtain x-ray of right wrist  Additional history obtained: Additional history obtained from  none Records reviewed  none  Initial Assessment:   Exam significant for tenderness with radial deviation of the wrist and when moving her right thumb.  She is able to make a closed fist and extend all fingers normally.  All digits of the right hand are neurovascularly intact.  Radial pulse is 2+.  She has full passive ROM.  There is no obvious swelling, deformity, bony tenderness, or snuffbox tenderness.   Independent ECG/labs interpretation:  The following labs were independently interpreted:  Not indicated  Independent visualization and interpretation of imaging: I independently visualized the following imaging with scope of interpretation limited to determining acute life threatening conditions related to emergency care: right wrist x-ray, which revealed no evidence of acute dislocation or fracture.  I agree with radiologist interpretation.   Treatment and Reassessment: Patient will be given a thumb spica splint here in the ED to offer support.  Discussed supportive care measures for home including uses of NSAIDs and ice to help with pain and swelling.  Recommended patient follow-up with her PCP or an orthopedic provider if she has worsening of her pain.  Referral info for [facility] was provided.    Disposition:   The patient has been appropriately medically screened and/or stabilized in the ED. I have low suspicion for any other emergent medical condition which would require further screening, evaluation or treatment in the ED or require inpatient management. At time of discharge the patient is hemodynamically stable and in no acute distress. I have discussed work-up results and diagnosis with patient and answered all questions. Patient is agreeable with discharge plan. We discussed strict return  precautions for returning to the emergency department and they verbalized understanding.            Final Clinical Impression(s) / ED Diagnoses Final diagnoses:  Sprain of right wrist, initial encounter    Rx / DC Orders ED Discharge Orders     None         Pat Kocher, [place] 04/05/22 1714    Isla Pence, MD 04/05/22 [phone]

## 2022-04-05 NOTE — ED Notes (Signed)
Applied ice to right wrist and pillow support.

## 2022-04-05 NOTE — ED Triage Notes (Signed)
Pt presents to ED from home s/p mechanical fall where she caught herself with her R wrist. Pt noted to be hypertensive in triage, states she has been forgetting to take prescribed meds for about a week.

## 2022-04-05 NOTE — ED Notes (Signed)
Applied comfort wrist thumb brace to right wrist

## 2022-04-06 ENCOUNTER — Other Ambulatory Visit: Payer: Self-pay | Admitting: Adult Health

## 2022-04-23 ENCOUNTER — Encounter: Payer: Self-pay | Admitting: Radiology

## 2022-04-30 ENCOUNTER — Emergency Department (HOSPITAL_COMMUNITY): Payer: Self-pay

## 2022-04-30 ENCOUNTER — Other Ambulatory Visit: Payer: Self-pay

## 2022-04-30 ENCOUNTER — Emergency Department (HOSPITAL_COMMUNITY)
Admission: EM | Admit: 2022-04-30 | Discharge: 2022-04-30 | Disposition: A | Discharge status: Home / Self Care | Payer: Self-pay | Attending: Emergency Medicine | Admitting: Emergency Medicine

## 2022-04-30 ENCOUNTER — Encounter (HOSPITAL_COMMUNITY): Payer: Self-pay | Admitting: *Deleted

## 2022-04-30 DIAGNOSIS — K2970 Gastritis, unspecified, without bleeding: Secondary | ICD-10-CM | POA: Insufficient documentation

## 2022-04-30 DIAGNOSIS — R1013 Epigastric pain: Secondary | ICD-10-CM

## 2022-04-30 DIAGNOSIS — I10 Essential (primary) hypertension: Secondary | ICD-10-CM | POA: Insufficient documentation

## 2022-04-30 LAB — CBC WITH DIFFERENTIAL/PLATELET
Abs Immature Granulocytes: 0.02 10*3/uL (ref 0.00–0.07)
Basophils Absolute: 0 10*3/uL (ref 0.0–0.1)
Basophils Relative: 0 %
Eosinophils Absolute: 0.1 10*3/uL (ref 0.0–0.5)
Eosinophils Relative: 2 %
HCT: 41.4 % (ref 36.0–46.0)
Hemoglobin: 13.5 g/dL (ref 12.0–15.0)
Immature Granulocytes: 0 %
Lymphocytes Relative: 23 %
Lymphs Abs: 1.9 10*3/uL (ref 0.7–4.0)
MCH: 28.5 pg (ref 26.0–34.0)
MCHC: 32.6 g/dL (ref 30.0–36.0)
MCV: 87.3 fL (ref 80.0–100.0)
Monocytes Absolute: 0.5 10*3/uL (ref 0.1–1.0)
Monocytes Relative: 6 %
Neutro Abs: 5.5 10*3/uL (ref 1.7–7.7)
Neutrophils Relative %: 69 %
Platelets: 308 10*3/uL (ref 150–400)
RBC: 4.74 MIL/uL (ref 3.87–5.11)
RDW: 13.5 % (ref 11.5–15.5)
WBC: 8 10*3/uL (ref 4.0–10.5)
nRBC: 0 % (ref 0.0–0.2)

## 2022-04-30 LAB — COMPREHENSIVE METABOLIC PANEL
ALT: 16 U/L (ref 0–44)
AST: 22 U/L (ref 15–41)
Albumin: 3.8 g/dL (ref 3.5–5.0)
Alkaline Phosphatase: 36 U/L — ABNORMAL LOW (ref 38–126)
Anion gap: 10 (ref 5–15)
BUN: 13 mg/dL (ref 6–20)
CO2: 25 mmol/L (ref 22–32)
Calcium: 8.6 mg/dL — ABNORMAL LOW (ref 8.9–10.3)
Chloride: 104 mmol/L (ref 98–111)
Creatinine, Ser: 0.63 mg/dL (ref 0.44–1.00)
GFR, Estimated: >60 mL/min (ref >60)
Glucose, Bld: 105 mg/dL — ABNORMAL HIGH (ref 70–99)
Potassium: 3.2 mmol/L — ABNORMAL LOW (ref 3.5–5.1)
Sodium: 139 mmol/L (ref 135–145)
Total Bilirubin: 0.7 mg/dL (ref 0.3–1.2)
Total Protein: 6.7 g/dL (ref 6.5–8.1)

## 2022-04-30 LAB — TROPONIN I (HIGH SENSITIVITY): Troponin I (High Sensitivity): 2 ng/L (ref <18)

## 2022-04-30 LAB — LIPASE, BLOOD: Lipase: 29 U/L (ref 11–51)

## 2022-04-30 LAB — POC URINE PREG, ED: Preg Test, Ur: NEGATIVE

## 2022-04-30 MED ORDER — ONDANSETRON 4 MG PO TBDP: 4.0000 mg | ORAL_TABLET | Freq: Three times a day (TID) | ORAL | 0 refills | Status: DC | PRN

## 2022-04-30 MED ORDER — POTASSIUM CHLORIDE CRYS ER 20 MEQ PO TBCR
40.0000 meq | EXTENDED_RELEASE_TABLET | ORAL | Status: AC
  Administered 2022-04-30: 40 meq via ORAL
  Filled 2022-04-30: qty 2

## 2022-04-30 MED ORDER — ONDANSETRON HCL 4 MG/2ML IJ SOLN: 4.0000 mg | Freq: Once | INTRAMUSCULAR | Status: DC

## 2022-04-30 MED ORDER — FAMOTIDINE 20 MG PO TABS
40.0000 mg | ORAL_TABLET | ORAL | Status: AC
  Administered 2022-04-30: 40 mg via ORAL
  Filled 2022-04-30: qty 2

## 2022-04-30 MED ORDER — LIDOCAINE VISCOUS HCL 2 % MT SOLN
15.0000 mL | Freq: Once | OROMUCOSAL | Status: AC
  Administered 2022-04-30: 15 mL via ORAL
  Filled 2022-04-30: qty 15

## 2022-04-30 MED ORDER — FAMOTIDINE 20 MG PO TABS: 20.0000 mg | ORAL_TABLET | Freq: Two times a day (BID) | ORAL | 0 refills | Status: DC

## 2022-04-30 MED ORDER — ALUM & MAG HYDROXIDE-SIMETH 200-200-20 MG/5ML PO SUSP
30.0000 mL | Freq: Once | ORAL | Status: AC
  Administered 2022-04-30: 30 mL via ORAL
  Filled 2022-04-30: qty 30

## 2022-04-30 MED ORDER — MORPHINE SULFATE (PF) 4 MG/ML IV SOLN: 4.0000 mg | Freq: Once | INTRAVENOUS | Status: DC

## 2022-04-30 NOTE — ED Provider Notes (Signed)
[facility] Provider Note   CSN: [id number] Arrival date & time: 04/30/22  1638     History  Chief Complaint  Patient presents with   Abdominal Pain    [name] is a 42 y.o. female.  42 year old female with a history of GERD and gallstones and hypertension hyperlipidemia who presents to the emergency department with left upper quadrant pain.  Says that she has longstanding pain in her left upper quadrant.  Says that it was worse Sunday but resolved and then recurred again today and was more severe.  Says that occasionally it radiates up into her left chest.  Worse with eating and laying down.  Unsure if it is exertional.  Did stop taking pliers psych recently.  Says it is worse at night.  Has been trying milk of magnesia without improvement.  No significant alcohol use or steroids or NSAID use.       Home Medications Prior to Admission medications   Medication Sig Start Date End Date Taking? Authorizing Provider  famotidine (PEPCID) 20 MG tablet Take 1 tablet (20 mg total) by mouth 2 (two) times daily. 04/30/22  Yes Fransico Meadow, MD  hydrochlorothiazide (MICROZIDE) 12.5 MG capsule Take 1 capsule by mouth once daily 04/06/22  Yes Derrek Monaco A, NP  ondansetron (ZOFRAN-ODT) 4 MG disintegrating tablet Take 1 tablet (4 mg total) by mouth every 8 (eight) hours as needed for nausea or vomiting. 04/30/22  Yes Fransico Meadow, MD  ferrous sulfate 324 MG TBEC Take 324 mg by mouth. Patient not taking: Reported on 04/30/2022    [provider]  potassium chloride SA (KLOR-CON M) 20 MEQ tablet Take 1 tablet (20 mEq total) by mouth daily. Patient not taking: Reported on 04/30/2022 03/25/22   Evalee Jefferson, PA-C      Allergies    Augmentin [amoxicillin-pot clavulanate]    Review of Systems   Review of Systems  Physical Exam Updated Vital Signs BP 133/76   Pulse 71   Temp 98.3 F (36.8 C) (Oral)   Resp 13   Ht 5' (1.524  m)   Wt 81.6 kg   LMP 04/05/2022   SpO2 100%   BMI 35.15 kg/m  Physical Exam Vitals and nursing note reviewed.  Constitutional:      General: She is not in acute distress.    Appearance: She is well-developed.  HENT:     Head: Normocephalic and atraumatic.     Right Ear: External ear normal.     Left Ear: External ear normal.     Nose: Nose normal.  Eyes:     Extraocular Movements: Extraocular movements intact.     Conjunctiva/sclera: Conjunctivae normal.     Pupils: Pupils are equal, round, and reactive to light.  Cardiovascular:     Rate and Rhythm: Normal rate and regular rhythm.     Heart sounds: No murmur heard. Pulmonary:     Effort: Pulmonary effort is normal. No respiratory distress.     Breath sounds: Normal breath sounds.  Abdominal:     General: Abdomen is flat. There is no distension.     Palpations: Abdomen is soft. There is no mass.     Tenderness: There is abdominal tenderness (Left upper quadrant and epigastrium). There is no guarding.  Musculoskeletal:     Cervical back: Normal range of motion and neck supple.     Right lower leg: No edema.     Left lower leg: No edema.  Skin:    General: Skin is warm and dry.  Neurological:     Mental Status: She is alert and oriented to person, place, and time. Mental status is at baseline.  Psychiatric:        Mood and Affect: Mood normal.     ED Results / Procedures / Treatments   Labs (all labs ordered are listed, but only abnormal results are displayed) Labs Reviewed  COMPREHENSIVE METABOLIC PANEL - Abnormal; Notable for the following components:      Result Value   Potassium 3.2 (*)    Glucose, Bld 105 (*)    Calcium 8.6 (*)    Alkaline Phosphatase 36 (*)    All other components within normal limits  LIPASE, BLOOD  CBC WITH DIFFERENTIAL/PLATELET  POC URINE PREG, ED  TROPONIN I (HIGH SENSITIVITY)    EKG EKG Interpretation  Date/Time:  Thursday April 30 2022 10:58:14 EST Ventricular Rate:  72 PR  Interval:  112 QRS Duration: 111 QT Interval:  420 QTC Calculation: 460 R Axis:   57 Text Interpretation: Sinus rhythm Borderline short PR interval Confirmed by Margaretmary Eddy [phone]) on 04/30/2022 11:22:47 AM  Radiology DG Chest Portable 1 View  Result Date: 04/30/2022 CLINICAL DATA:  Chest pain EXAM: PORTABLE CHEST 1 VIEW COMPARISON:  03/25/2022 FINDINGS: The heart size and mediastinal contours are within normal limits. Both lungs are clear. The visualized skeletal structures are unremarkable. IMPRESSION: No active disease. Electronically Signed   By: Elmer Picker M.D.   On: 04/30/2022 10:33    Procedures Procedures   Medications Ordered in ED Medications  alum & mag hydroxide-simeth (MAALOX/MYLANTA) 200-200-20 MG/5ML suspension 30 mL (30 mLs Oral Given 04/30/22 1105)    And  lidocaine (XYLOCAINE) 2 % viscous mouth solution 15 mL (15 mLs Oral Given 04/30/22 1105)  famotidine (PEPCID) tablet 40 mg (40 mg Oral Given 04/30/22 1105)  potassium chloride SA (KLOR-CON M) CR tablet 40 mEq (40 mEq Oral Given 04/30/22 1105)    ED Course/ Medical Decision Making/ A&P                             Medical Decision Making Amount and/or Complexity of Data Reviewed Labs: ordered. Radiology: ordered.  Risk OTC drugs. Prescription drug management.   [name] is a 42 y.o. female with comorbidities that complicate the patient evaluation including hypertension, hyperlipidemia, and GERD who presents emergency department with epigastric and left upper quadrant pain  Initial Ddx:  Gastritis, reflux, MI  MDM:  Feel the patient likely has gastritis based on her description of symptoms and may be related to reflux especially since her symptoms are worse at night.  This was also in the setting of stopping one of her antacids recently.  Because it is sometimes radiating to her chest and she does have hypertension hyperlipidemia will obtain EKG and troponin though feel that MI is less  likely.  Plan:  Labs Lipase Troponin EKG Chest x-ray  ED Summary/Re-evaluation:  Patient underwent the above workup which was unremarkable.  Was treated with a GI cocktail which did improve her symptoms.  Instructed to continue her antacids at home and was given Pepcid should she run out of them.  Informed her that she should follow-up with her primary doctor and GI.  This patient presents to the ED for concern of complaints listed in HPI, this involves an extensive number of treatment options, and is a complaint that carries with it a  high risk of complications and morbidity. Disposition including potential need for admission considered.   Dispo: DC Home. Return precautions discussed including, but not limited to, those listed in the AVS. Allowed pt time to ask questions which were answered fully prior to dc.  Records reviewed Outpatient Clinic Notes The following labs were independently interpreted: Chemistry and show no acute abnormality I independently reviewed the following imaging with scope of interpretation limited to determining acute life threatening conditions related to emergency care: Chest x-ray and agree with the radiologist interpretation with the following exceptions: none I personally reviewed and interpreted cardiac monitoring: normal sinus rhythm  I personally reviewed and interpreted the pt's EKG: see above for interpretation  I have reviewed the patients home medications and made adjustments as needed  Final Clinical Impression(s) / ED Diagnoses Final diagnoses:  Epigastric pain  Gastritis without bleeding, unspecified chronicity, unspecified gastritis type    Rx / DC Orders ED Discharge Orders          Ordered    famotidine (PEPCID) 20 MG tablet  2 times daily        04/30/22 1257    ondansetron (ZOFRAN-ODT) 4 MG disintegrating tablet  Every 8 hours PRN        04/30/22 1259              Fransico Meadow, MD 05/01/22 1154

## 2022-04-30 NOTE — Discharge Instructions (Addendum)
You were seen for stomach irritation (gastritis) in the emergency department.   At home, please take the antacids you have been prescribed.  If you run out of these you may take the Pepcid we have prescribed you.  You may also take the Zofran we have prescribed you for any nausea or vomiting that you may have.  Check your MyChart online for the results of any tests that had not resulted by the time you left the emergency department.   Follow-up with your primary doctor in 2-3 days regarding your visit.    Return immediately to the emergency department if you experience any of the following: Chest pain, shortness of breath, worsening vomiting, or any other concerning symptoms.    Thank you for visiting our Emergency Department. It was a pleasure taking care of you today.

## 2022-04-30 NOTE — ED Triage Notes (Signed)
Pt c/o upper abdominal pain that started at 2am with n/v/d; pt states she had an episode Sunday but the pain went away

## 2022-07-25 ENCOUNTER — Emergency Department (HOSPITAL_COMMUNITY): Payer: Self-pay

## 2022-07-25 ENCOUNTER — Other Ambulatory Visit: Payer: Self-pay

## 2022-07-25 ENCOUNTER — Encounter (HOSPITAL_COMMUNITY): Payer: Self-pay

## 2022-07-25 ENCOUNTER — Emergency Department (HOSPITAL_COMMUNITY)
Admission: EM | Admit: 2022-07-25 | Discharge: 2022-07-25 | Disposition: A | Discharge status: Home / Self Care | Payer: Self-pay | Attending: Student | Admitting: Student

## 2022-07-25 DIAGNOSIS — I10 Essential (primary) hypertension: Secondary | ICD-10-CM | POA: Insufficient documentation

## 2022-07-25 DIAGNOSIS — K219 Gastro-esophageal reflux disease without esophagitis: Secondary | ICD-10-CM | POA: Insufficient documentation

## 2022-07-25 DIAGNOSIS — R112 Nausea with vomiting, unspecified: Secondary | ICD-10-CM | POA: Insufficient documentation

## 2022-07-25 DIAGNOSIS — Z79899 Other long term (current) drug therapy: Secondary | ICD-10-CM | POA: Insufficient documentation

## 2022-07-25 DIAGNOSIS — R079 Chest pain, unspecified: Secondary | ICD-10-CM | POA: Insufficient documentation

## 2022-07-25 DIAGNOSIS — R1013 Epigastric pain: Secondary | ICD-10-CM | POA: Insufficient documentation

## 2022-07-25 LAB — CBC WITH DIFFERENTIAL/PLATELET
Abs Immature Granulocytes: 0.03 10*3/uL (ref 0.00–0.07)
Basophils Absolute: 0 10*3/uL (ref 0.0–0.1)
Basophils Relative: 0 %
Eosinophils Absolute: 0 10*3/uL (ref 0.0–0.5)
Eosinophils Relative: 0 %
HCT: 40.9 % (ref 36.0–46.0)
Hemoglobin: 13.3 g/dL (ref 12.0–15.0)
Immature Granulocytes: 0 %
Lymphocytes Relative: 12 %
Lymphs Abs: 1.1 10*3/uL (ref 0.7–4.0)
MCH: 26.9 pg (ref 26.0–34.0)
MCHC: 32.5 g/dL (ref 30.0–36.0)
MCV: 82.6 fL (ref 80.0–100.0)
Monocytes Absolute: 0.4 10*3/uL (ref 0.1–1.0)
Monocytes Relative: 4 %
Neutro Abs: 8 10*3/uL — ABNORMAL HIGH (ref 1.7–7.7)
Neutrophils Relative %: 84 %
Platelets: 364 10*3/uL (ref 150–400)
RBC: 4.95 MIL/uL (ref 3.87–5.11)
RDW: 14.4 % (ref 11.5–15.5)
WBC: 9.5 10*3/uL (ref 4.0–10.5)
nRBC: 0 % (ref 0.0–0.2)

## 2022-07-25 LAB — COMPREHENSIVE METABOLIC PANEL
ALT: 18 U/L (ref 0–44)
AST: 24 U/L (ref 15–41)
Albumin: 4 g/dL (ref 3.5–5.0)
Alkaline Phosphatase: 43 U/L (ref 38–126)
Anion gap: 10 (ref 5–15)
BUN: 11 mg/dL (ref 6–20)
CO2: 24 mmol/L (ref 22–32)
Calcium: 8.7 mg/dL — ABNORMAL LOW (ref 8.9–10.3)
Chloride: 105 mmol/L (ref 98–111)
Creatinine, Ser: 0.64 mg/dL (ref 0.44–1.00)
GFR, Estimated: >60 mL/min (ref >60)
Glucose, Bld: 120 mg/dL — ABNORMAL HIGH (ref 70–99)
Potassium: 3.5 mmol/L (ref 3.5–5.1)
Sodium: 139 mmol/L (ref 135–145)
Total Bilirubin: 0.3 mg/dL (ref 0.3–1.2)
Total Protein: 7.5 g/dL (ref 6.5–8.1)

## 2022-07-25 LAB — TROPONIN I (HIGH SENSITIVITY)
Troponin I (High Sensitivity): 3 ng/L (ref <18)
Troponin I (High Sensitivity): 3 ng/L (ref <18)

## 2022-07-25 LAB — LIPASE, BLOOD: Lipase: 30 U/L (ref 11–51)

## 2022-07-25 MED ORDER — DICYCLOMINE HCL 10 MG PO CAPS
20.0000 mg | ORAL_CAPSULE | Freq: Once | ORAL | Status: AC
  Administered 2022-07-25: 20 mg via ORAL
  Filled 2022-07-25: qty 2

## 2022-07-25 MED ORDER — ALUM & MAG HYDROXIDE-SIMETH 200-200-20 MG/5ML PO SUSP
30.0000 mL | Freq: Once | ORAL | Status: AC
  Administered 2022-07-25: 30 mL via ORAL
  Filled 2022-07-25: qty 30

## 2022-07-25 MED ORDER — LIDOCAINE VISCOUS HCL 2 % MT SOLN
15.0000 mL | Freq: Once | OROMUCOSAL | Status: AC
  Administered 2022-07-25: 15 mL via ORAL
  Filled 2022-07-25: qty 15

## 2022-07-25 MED ORDER — DICYCLOMINE HCL 20 MG PO TABS: 20.0000 mg | ORAL_TABLET | Freq: Two times a day (BID) | ORAL | 0 refills | Status: DC | PRN

## 2022-07-25 MED ORDER — FAMOTIDINE IN NACL 20-0.9 MG/50ML-% IV SOLN
20.0000 mg | Freq: Once | INTRAVENOUS | Status: AC
  Administered 2022-07-25: 20 mg via INTRAVENOUS
  Filled 2022-07-25: qty 50

## 2022-07-25 MED ORDER — ESOMEPRAZOLE MAGNESIUM 40 MG PO CPDR: 40.0000 mg | DELAYED_RELEASE_CAPSULE | Freq: Two times a day (BID) | ORAL | 3 refills | Status: AC

## 2022-07-25 MED ORDER — ONDANSETRON HCL 4 MG/2ML IJ SOLN
4.0000 mg | Freq: Once | INTRAMUSCULAR | Status: AC
  Administered 2022-07-25: 4 mg via INTRAVENOUS
  Filled 2022-07-25: qty 2

## 2022-07-25 MED ORDER — LACTATED RINGERS IV BOLUS
1000.0000 mL | Freq: Once | INTRAVENOUS | Status: AC
  Administered 2022-07-25: 1000 mL via INTRAVENOUS

## 2022-07-25 MED ORDER — MAALOX MAX 400-400-40 MG/5ML PO SUSP: 15.0000 mL | Freq: Four times a day (QID) | ORAL | 0 refills | Status: DC | PRN

## 2022-07-25 MED ORDER — KETOROLAC TROMETHAMINE 15 MG/ML IJ SOLN
15.0000 mg | Freq: Once | INTRAMUSCULAR | Status: AC
  Administered 2022-07-25: 15 mg via INTRAMUSCULAR
  Filled 2022-07-25: qty 1

## 2022-07-25 NOTE — ED Provider Notes (Signed)
[place] EMERGENCY DEPARTMENT AT [facility] Provider Note  CSN: [id number] Arrival date & time: 07/25/22 4098  Chief Complaint(s) Chest Pain  HPI [name] is a 42 y.o. female with PMH GERD, HTN, iron deficiency anemia, anxiety, depression who presents emergency department for evaluation of epigastric pain and chest pain.  Patient was seen in March 2024 with similar symptoms and states that this feels fairly similar to the symptoms she had at that presentation as well.  Patient had a negative cardiac workup at this time and states that today her symptoms woke her from sleep with pain primarily in the left upper quadrant radiating up into the chest.  She denies associated shortness of breath, diaphoresis, headache, fever or other systemic symptoms.  Does endorse nausea and 1 episode of vomiting.  She is unsure of any triggers but states that she has been overall compliant with her PPI therapy (ran out of the last 2 days, but otherwise compliant) and took 2 ibuprofen for a headache yesterday.   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   Elevated triglycerides with high cholesterol 10/20/2019   Anemia 10/20/2019   Dyspareunia, female 10/18/2019   Encounter for gynecological examination with Papanicolaou smear of cervix 10/18/2019   Frequent headaches 10/18/2019   Tired 10/18/2019   Elevated BP without diagnosis of hypertension 10/18/2019   History of abnormal cervical Pap smear 10/18/2019   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 Authorizing Provider  alum & mag hydroxide-simeth (MAALOX MAX) 400-400-40 MG/5ML suspension Take 15 mLs by mouth every 6 (six) hours as needed for indigestion. 07/25/22  Yes Lindy Garczynski, MD  dicyclomine (BENTYL) 20 MG tablet Take 1 tablet (20 mg total) by mouth 2 (two) times daily as needed for spasms. 07/25/22  Yes Aryan Bello, MD  esomeprazole (NEXIUM) 40 MG capsule Take 1 capsule (40 mg total) by mouth 2 (two) times daily before a meal. 07/25/22 09/23/22 Yes Mariha Sleeper, MD  famotidine (PEPCID) 20 MG tablet Take 1 tablet (20 mg total) by mouth 2 (two) times daily. 04/30/22   Rondel Baton, MD  ferrous sulfate 324 MG TBEC Take 324 mg by mouth. Patient not taking: Reported on 04/30/2022    [provider]  hydrochlorothiazide (MICROZIDE) 12.5 MG capsule Take 1 capsule by mouth once daily 04/06/22   [name] A, NP  ondansetron (ZOFRAN-ODT) 4 MG disintegrating tablet Take 1 tablet (4 mg total) by mouth every 8 (eight) hours as needed for nausea or vomiting. 04/30/22   Rondel Baton, MD  potassium chloride SA (KLOR-CON M) 20 MEQ tablet Take 1 tablet (20 mEq total) by mouth daily. Patient not taking: Reported on 04/30/2022 03/25/22   Burgess Amor, PA-C  Past Surgical History Past Surgical History:  Procedure Laterality Date   CESAREAN SECTION     CYSTOSCOPY W/ URETERAL STENT PLACEMENT Left 01/11/2020   Procedure: CYSTOSCOPY WITH RETROGRADE PYELOGRAM/URETERAL STENT PLACEMENT;  Surgeon: Malen Gauze, MD;  Location: [facility];  Service: Urology;  Laterality: Left;    ectopic pregnancies     HOLMIUM LASER APPLICATION Left 01/11/2020   Procedure: HOLMIUM LASER APPLICATION-ureteroscopy;  Surgeon: Malen Gauze, MD;  Location: [facility];  Service: Urology;  Laterality: Left;   left fallopian tube removal     right tube removed     history of ectopic pregnancies   Family History Family History  Problem Relation Age of Onset   Hypertension Mother    Cancer Maternal Grandmother        colon cancer   Cancer Maternal Grandfather        colon cancer    Social History Social History   Tobacco Use   Smoking status: Never   Smokeless tobacco: Never  Vaping Use   Vaping Use: Never used  Substance Use Topics   Alcohol use: No   Drug use: Not Currently    Types: Benzodiazepines, Marijuana    Comment: opiates- Former   Allergies Augmentin [amoxicillin-pot clavulanate]  Review of Systems Review of Systems  Cardiovascular:  Positive for chest pain.  Gastrointestinal:  Positive for abdominal pain.    Physical Exam Vital Signs  I have reviewed the triage vital signs BP 136/72   Pulse 65   Temp 98.2 F (36.8 C) (Oral)   Resp 20   Ht 4\' 11"  (1.499 m)   Wt 83.9 kg   LMP 07/21/2022 (Exact Date)   SpO2 95%   BMI 37.37 kg/m   Physical Exam Vitals and nursing note reviewed.  Constitutional:      General: She is not in acute distress.    Appearance: She is well-developed.  HENT:     Head: Normocephalic and atraumatic.  Eyes:     Conjunctiva/sclera: Conjunctivae normal.  Cardiovascular:     Rate and Rhythm: Normal rate and regular rhythm.     Heart sounds: No murmur heard. Pulmonary:     Effort: Pulmonary effort is normal. No respiratory distress.     Breath sounds: Normal breath sounds.  Abdominal:     Palpations: Abdomen is soft.     Tenderness: There is abdominal tenderness.  Musculoskeletal:        General: No swelling.     Cervical back: Neck supple.  Skin:    General: Skin is warm and dry.     Capillary Refill: Capillary  refill takes less than 2 seconds.  Neurological:     Mental Status: She is alert.  Psychiatric:        Mood and Affect: Mood normal.     ED Results and Treatments Labs (all labs ordered are listed, but only abnormal results are displayed) Labs Reviewed  COMPREHENSIVE METABOLIC PANEL - Abnormal; Notable for the following components:      Result Value   Glucose, Bld 120 (*)    Calcium 8.7 (*)    All other components within normal limits  CBC WITH DIFFERENTIAL/PLATELET - Abnormal; Notable for the following components:   Neutro Abs 8.0 (*)    All other components within normal limits  LIPASE, BLOOD  TROPONIN I (HIGH SENSITIVITY)  TROPONIN I (HIGH SENSITIVITY)  Radiology DG Chest 2 View  Result Date: 07/25/2022 CLINICAL DATA:  Chest pain beginning early this morning. EXAM: CHEST - 2 VIEW COMPARISON:  04/30/2022. FINDINGS: Normal heart, mediastinum and hila. Clear lungs.  No pleural effusion or pneumothorax. Skeletal structures are intact. IMPRESSION: No active cardiopulmonary disease. Electronically Signed   By: Amie Portland M.D.   On: 07/25/2022 09:34    Pertinent labs & imaging results that were available during my care of the patient were reviewed by me and considered in my medical decision making (see MDM for details).  Medications Ordered in ED Medications  alum & mag hydroxide-simeth (MAALOX/MYLANTA) 200-200-20 MG/5ML suspension 30 mL (30 mLs Oral Given 07/25/22 0731)    And  lidocaine (XYLOCAINE) 2 % viscous mouth solution 15 mL (15 mLs Oral Given 07/25/22 0731)  famotidine (PEPCID) IVPB 20 mg premix (0 mg Intravenous Stopped 07/25/22 0949)  ketorolac (TORADOL) 15 MG/ML injection 15 mg (15 mg Intramuscular Given 07/25/22 0948)  ondansetron (ZOFRAN) injection 4 mg (4 mg Intravenous Given 07/25/22 0947)  lactated ringers bolus 1,000 mL (0 mLs Intravenous Stopped 07/25/22  1141)  dicyclomine (BENTYL) capsule 20 mg (20 mg Oral Given 07/25/22 1059)                                                                                                                                     Procedures Procedures  (including critical care time)  Medical Decision Making / ED Course   This patient presents to the ED for concern of epigastric pain, chest pain, this involves an extensive number of treatment options, and is a complaint that carries with it a high risk of complications and morbidity.  The differential diagnosis includes GERD, gastritis, peptic ulcer disease, pancreatitis, atypical ACS, pneumonia, cholecystitis  MDM: Patient seen emergency room for evaluation of epigastric abdominal pain radiating up into the chest.  Physical exam with epigastric tenderness to palpation but is otherwise unremarkable.  Laboratory evaluation unremarkable including high-sensitivity troponin and delta troponin.  ECG nonischemic.  Chest x-ray unremarkable.  Patient received GI cocktail and Pepcid and symptoms improved but were still persistent.  Patient then given single dose of Bentyl and her symptoms resolved.  This is the patient's second presentation for the same symptoms since March 2024 and I do believe the patient would benefit from outpatient gastroenterology evaluation for persistent GERD.  She will be started on an 8-week course of esomeprazole and I placed an outpatient referral to gastroenterology.  Patient's heart score is less than 3 and I have very low suspicion for ACS at this time.  Patient then discharged with outpatient GI follow-up.   Additional history obtained: -Additional history obtained from partner -External records from outside source obtained and reviewed including: Chart review including previous notes, labs, imaging, consultation notes   Lab Tests: -I ordered, reviewed, and interpreted labs.   The pertinent results include:   Labs Reviewed  COMPREHENSIVE  METABOLIC  PANEL - Abnormal; Notable for the following components:      Result Value   Glucose, Bld 120 (*)    Calcium 8.7 (*)    All other components within normal limits  CBC WITH DIFFERENTIAL/PLATELET - Abnormal; Notable for the following components:   Neutro Abs 8.0 (*)    All other components within normal limits  LIPASE, BLOOD  TROPONIN I (HIGH SENSITIVITY)  TROPONIN I (HIGH SENSITIVITY)      EKG   EKG Interpretation  Date/Time:  Saturday July 25 2022 07:14:49 EDT Ventricular Rate:  60 PR Interval:  114 QRS Duration: 116 QT Interval:  443 QTC Calculation: 443 R Axis:   36 Text Interpretation: Sinus rhythm Incomplete right bundle branch block No significant change since last tracing Confirmed by Kabrina Christiano ([id number]) on 07/25/2022 7:18:05 AM         Imaging Studies ordered: I ordered imaging studies including chest x-ray I independently visualized and interpreted imaging. I agree with the radiologist interpretation   Medicines ordered and prescription drug management: Meds ordered this encounter  Medications   AND Linked Order Group    alum & mag hydroxide-simeth (MAALOX/MYLANTA) 200-200-20 MG/5ML suspension 30 mL    lidocaine (XYLOCAINE) 2 % viscous mouth solution 15 mL   famotidine (PEPCID) IVPB 20 mg premix   ketorolac (TORADOL) 15 MG/ML injection 15 mg   ondansetron (ZOFRAN) injection 4 mg   lactated ringers bolus 1,000 mL   dicyclomine (BENTYL) capsule 20 mg   esomeprazole (NEXIUM) 40 MG capsule    Sig: Take 1 capsule (40 mg total) by mouth 2 (two) times daily before a meal.    Dispense:  30 capsule    Refill:  3   alum & mag hydroxide-simeth (MAALOX MAX) 400-400-40 MG/5ML suspension    Sig: Take 15 mLs by mouth every 6 (six) hours as needed for indigestion.    Dispense:  355 mL    Refill:  0   dicyclomine (BENTYL) 20 MG tablet    Sig: Take 1 tablet (20 mg total) by mouth 2 (two) times daily as needed for spasms.    Dispense:  20 tablet    Refill:  0     -I have reviewed the patients home medicines and have made adjustments as needed  Critical interventions none    Cardiac Monitoring: The patient was maintained on a cardiac monitor.  I personally viewed and interpreted the cardiac monitored which showed an underlying rhythm of: NSR  Social Determinants of Health:  Factors impacting patients care include: none   Reevaluation: After the interventions noted above, I reevaluated the patient and found that they have :improved  Co morbidities that complicate the patient evaluation  Past Medical History:  Diagnosis Date   Abnormal pap    Anemia 10/20/2019   Add iron   Anxiety    Depression    Elevated triglycerides with high cholesterol 10/20/2019   Try lifestyle modifications and consider meds too   GERD (gastroesophageal reflux disease)    Hypertension    Vaginal Pap smear, abnormal       Dispostion: I considered admission for this patient, but at this time she does not meet inpatient criteria and is safe for discharge with outpatient follow-up and return precautions of which she voiced understanding     Final Clinical Impression(s) / ED Diagnoses Final diagnoses:  Epigastric pain     @PCDICTATION @    Glendora Score, MD 07/25/22 1824

## 2022-07-25 NOTE — ED Triage Notes (Signed)
Pt comes in with chest pain starting at 3 am this morning. Pt states chest pain is on the left side of chest and radiates to left arm. Pain is a "knawing" pain. Pt states hx of chest pain with no MI hx.

## 2022-08-15 NOTE — Progress Notes (Unsigned)
Referring Provider: Glendora Score, MD  Primary Care Physician:  Patient, [name] Primary Gastroenterologist:  Dr. Marletta Lor  Chief Complaint  Patient presents with   LUQ pain    Patient referred here today due to having LUQ pain. She says the symptoms have been worsening for the last two three month. She says when this occurs she gets nauseous and occasionally will vomit. She is taking Esomeprazole 40 mg bid. She says since starting this recently the symptoms have became more tolerable. She has used the bentyl prn, and she is not taking ondansetron.     HPI:   [name] is a 42 y.o. female presenting today at the request of Kommor, [place], MD (ER physician)  for epigastric pain.   First evaluated in the emergency room for LUQ abdominal pain 04/30/2022.  She reported chronic history of this, but some worsening recently.  Reports symptoms worsen when eating or laying down.  Laboratory evaluation including CBC, CMP, lipase unrevealing.  Suspected patient had gastritis.  She is advised to start famotidine 20 mg twice daily.  Seen again in the emergency room 07/25/2022 for epigastric and chest pain.  She reported running out of PPI 2 days ago.  Laboratory evaluation again unrevealing.  She had mild epigastric tenderness on exam.  She received GI cocktail and Pepcid with symptom improvement, but still present.  She did receive dicyclomine and symptoms resolved.  Recommended starting Nexium 40 mg twice daily and follow-up with GI.  She is was prescribed dicyclomine to use as needed.  Today: Intermittent, infrequent LUQ abdominal pain for the last few years that she thought was trapped gas or indigestion.  However, symptoms were worsening over the last 2 to 3 months.  Reported symptoms started around 1 AM, described as gnawing, burning, waves of intensity 8-9 out of 10 pain, lasting hours at a time.  Pain would radiate to her shoulder and back.  No postprandial symptoms, daytime symptoms.  No  chest pain.  She does report chronic intermittent heartburn for which she has been taking Prilosec over-the-counter daily.  No dysphagia.  She has had associated nausea with occasional vomiting with her pain.  She tried Maalox when her symptoms started, but this provided no relief.  Since starting esomeprazole 40 mg bid she has not had another flare. Took dicyclomine once due to some abdominal cramping prior to bed.   Brbpr/melena: None.  Unintentional weight loss: None.   Bowels fluctuate. Will skip a couple days then have a day  with several Bms. No diarrhea.   NSAIDs: Was taking ibuprofen at least once a day but couple be 2-3 times a day for migraines. Trying to avoid this.   Prior EGD: Never Prior colonoscopy: Never  Maternal grandmother and grandfather with colon cancer. No 1st degree relatives with colon cancer.   She would like to proceed with endoscopic evaluation as she wants answers for the cause of her pain.  Past Medical History:  Diagnosis Date   Abnormal pap    Anemia 10/20/2019   Add iron   Anxiety    Depression    Elevated triglycerides with high cholesterol 10/20/2019   Try lifestyle modifications and consider meds too   GERD (gastroesophageal reflux disease)    Hypertension    Vaginal Pap smear, abnormal     Past Surgical History:  Procedure Laterality Date   CESAREAN SECTION     CYSTOSCOPY W/ URETERAL STENT PLACEMENT Left 01/11/2020   Procedure: CYSTOSCOPY WITH RETROGRADE PYELOGRAM/URETERAL STENT PLACEMENT;  Surgeon: Malen Gauze, MD;  Location: [facility];  Service: Urology;  Laterality: Left;   ectopic pregnancies     HOLMIUM LASER APPLICATION Left 01/11/2020   Procedure: HOLMIUM LASER APPLICATION-ureteroscopy;  Surgeon: Malen Gauze, MD;  Location: [facility];  Service: Urology;  Laterality: Left;   left fallopian tube removal     right tube removed     history of ectopic pregnancies    Current Outpatient Medications  Medication Sig Dispense  Refill   alum & mag hydroxide-simeth (MAALOX MAX) 400-400-40 MG/5ML suspension Take 15 mLs by mouth every 6 (six) hours as needed for indigestion. 355 mL 0   dicyclomine (BENTYL) 20 MG tablet Take 1 tablet (20 mg total) by mouth 2 (two) times daily as needed for spasms. 20 tablet 0   esomeprazole (NEXIUM) 40 MG capsule Take 1 capsule (40 mg total) by mouth 2 (two) times daily before a meal. 30 capsule 3   hydrochlorothiazide (MICROZIDE) 12.5 MG capsule Take 1 capsule by mouth once daily 90 capsule 0   No current facility-administered medications for this visit.    Allergies as of 08/17/2022 - Review Complete 08/17/2022  Allergen Reaction Noted   Augmentin [amoxicillin-pot clavulanate] Other (See Comments) 06/12/2015    Family History  Problem Relation Age of Onset   Hypertension Mother    Cancer Maternal Grandmother        colon cancer; 50s-60s   Cancer Maternal Grandfather        colon cancer; 50s-60s    Social History   Socioeconomic History   Marital status: Legally Separated    Spouse name: Not on file   Number of children: 1   Years of education: Not on file   Highest education level: Not on file  Occupational History   Occupation: TA/Bus driver  Tobacco Use   Smoking status: Never   Smokeless tobacco: Never  Vaping Use   Vaping Use: Never used  Substance and Sexual Activity   Alcohol use: No   Drug use: Not Currently    Types: Benzodiazepines, Marijuana    Comment: opiates- Former   Sexual activity: Yes    Birth control/protection: Surgical    Comment: tubal  Other Topics Concern   Not on file  Social History Narrative   Not on file   Social Determinants of Health   Financial Resource Strain: Low Risk  (10/18/2019)   Overall Financial Resource Strain (CARDIA)    Difficulty of Paying Living Expenses: Not hard at all  Food Insecurity: No Food Insecurity (10/18/2019)   Hunger Vital Sign    Worried About Running Out of Food in the Last Year: Never true     Ran Out of Food in the Last Year: Never true  Transportation Needs: No Transportation Needs (10/18/2019)   PRAPARE - Administrator, Civil Service (Medical): No    Lack of Transportation (Non-Medical): No  Physical Activity: Inactive (10/18/2019)   Exercise Vital Sign    Days of Exercise per Week: 0 days    Minutes of Exercise per Session: 10 min  Stress: No Stress Concern Present (10/18/2019)   [employer] of Occupational Health - Occupational Stress Questionnaire    Feeling of Stress : Only a little  Social Connections: Moderately Integrated (10/18/2019)   Social Connection and Isolation Panel [NHANES]    Frequency of Communication with Friends and Family: More than three times a week    Frequency of Social Gatherings with Friends and Family: Once a week  Attends Religious Services: More than 4 times per year    Active Member of Clubs or Organizations: No    Attends Banker Meetings: Never    Marital Status: Married  Catering manager Violence: Not At Risk (10/18/2019)   Humiliation, Afraid, Rape, and Kick questionnaire    Fear of Current or Ex-Partner: No    Emotionally Abused: No    Physically Abused: No    Sexually Abused: No    Review of Systems: Gen: Denies any fever, chills, cold or flulike symptoms, presyncope, syncope. CV: Denies chest pain, heart palpitations. Resp: Denies shortness of breath, cough. GI: See HPI I GU : Denies urinary burning, urinary frequency, urinary hesitancy MS: Denies joint pain. Derm: Denies rash. Psych: Denies depression, anxiety. Heme: See HPI  Physical Exam: BP 128/77 (BP Location: Left Arm, Patient Position: Sitting, Cuff Size: Large)   Pulse 87   Temp (!) 97.5 F (36.4 C) (Temporal)   Ht 5' (1.524 m)   Wt 182 lb 12.8 oz (82.9 kg)   LMP 07/21/2022 (Exact Date)   BMI 35.70 kg/m  General:   Alert and oriented. Pleasant and cooperative. Well-nourished and well-developed.  Head:  Normocephalic and  atraumatic. Eyes:  Without icterus, sclera clear and conjunctiva pink.  Ears:  Normal auditory acuity. Lungs:  Clear to auscultation bilaterally. No wheezes, rales, or rhonchi. No distress.  Heart:  S1, S2 present without murmurs appreciated.  Abdomen:  +BS, soft, non-tender and non-distended. No HSM noted. No guarding or rebound. No masses appreciated.  Rectal:  Deferred  Msk:  Symmetrical without gross deformities. Normal posture. Extremities:  Without edema. Neurologic:  Alert and  oriented x4;  grossly normal neurologically. Skin:  Intact without significant lesions or rashes. Psych:   Normal mood and affect.    Assessment:  42 year old female with history of anxiety, depression, HTN, GERD, presenting today for further evaluation of LUQ abdominal pain.  LUQ abdominal pain: Intermittent and infrequent for the last 2 years, but increasing in frequency and intensity over the last few months. Evaluated in the ER x 2 with on significant laboratory abnormalities. No recent abdominal imaging on file, but abdominal exam benign today.  It is interesting that her pain primarily occurred at night, but this could have been secondary to her morning omeprazole wearing off.  Suspect symptoms are likely secondary to gastritis, duodenitis, PUD in the setting of chronic NSAID use for migraines.  Unable to rule out H. pylori, doubt malignancy.  Since starting esomeprazole 40 mg twice daily on 6/1, she has not had a flare of her symptoms.  She has also been avoiding NSAIDs since 6/1.  No brbpr, melena, or unintentional weight loss.  She is never had an EGD but would like to pursue this for definitive diagnosis of LUQ pain.  GERD: Chronic. Previously fairly well-controlled on OTC Prilosec.  Currently on esomeprazole 40 mg twice daily due to LUQ abdominal pain discussed above.  Constipation: Chronic, mild constipation skipping a couple days between bowel movements, then having several bowel movements the  following day.  No alarm symptoms.  Recommended starting Benefiber.  Can add Colace or MiraLAX daily if needed.    Plan:  Proceed with upper endoscopy with propofol by Dr. Marletta Lor in near future. The risks, benefits, and alternatives have been discussed with the patient in detail. The patient states understanding and desires to proceed.  ASA 2 UPT and BMP prior.  Continue Nexium 40 mg twice daily. Avoid all NSAIDs. Reinforced GERD diet/lifestyle.  Written instructions provided on AVS. Start Benefiber 2 teaspoons daily x 2 weeks, then increase to twice daily. If Benefiber does not resolve constipation, can add Colace 100-50 mg daily or MiraLAX 17 g daily. Follow-up in office after EGD.    Ermalinda Memos, PA-C [facility] 08/17/2022

## 2022-08-15 NOTE — H&P (View-Only) (Signed)
Referring Provider: Glendora Score, MD  Primary Care Physician:  Patient, [name] Primary Gastroenterologist:  Dr. Marletta Lor  Chief Complaint  Patient presents with   LUQ pain    Patient referred here today due to having LUQ pain. She says the symptoms have been worsening for the last two three month. She says when this occurs she gets nauseous and occasionally will vomit. She is taking Esomeprazole 40 mg bid. She says since starting this recently the symptoms have became more tolerable. She has used the bentyl prn, and she is not taking ondansetron.     HPI:   [name] is a 42 y.o. female presenting today at the request of Kommor, [place], MD (ER physician)  for epigastric pain.   First evaluated in the emergency room for LUQ abdominal pain 04/30/2022.  She reported chronic history of this, but some worsening recently.  Reports symptoms worsen when eating or laying down.  Laboratory evaluation including CBC, CMP, lipase unrevealing.  Suspected patient had gastritis.  She is advised to start famotidine 20 mg twice daily.  Seen again in the emergency room 07/25/2022 for epigastric and chest pain.  She reported running out of PPI 2 days ago.  Laboratory evaluation again unrevealing.  She had mild epigastric tenderness on exam.  She received GI cocktail and Pepcid with symptom improvement, but still present.  She did receive dicyclomine and symptoms resolved.  Recommended starting Nexium 40 mg twice daily and follow-up with GI.  She is was prescribed dicyclomine to use as needed.  Today: Intermittent, infrequent LUQ abdominal pain for the last few years that she thought was trapped gas or indigestion.  However, symptoms were worsening over the last 2 to 3 months.  Reported symptoms started around 1 AM, described as gnawing, burning, waves of intensity 8-9 out of 10 pain, lasting hours at a time.  Pain would radiate to her shoulder and back.  No postprandial symptoms, daytime symptoms.  No  chest pain.  She does report chronic intermittent heartburn for which she has been taking Prilosec over-the-counter daily.  No dysphagia.  She has had associated nausea with occasional vomiting with her pain.  She tried Maalox when her symptoms started, but this provided no relief.  Since starting esomeprazole 40 mg bid she has not had another flare. Took dicyclomine once due to some abdominal cramping prior to bed.   Brbpr/melena: None.  Unintentional weight loss: None.   Bowels fluctuate. Will skip a couple days then have a day  with several Bms. No diarrhea.   NSAIDs: Was taking ibuprofen at least once a day but couple be 2-3 times a day for migraines. Trying to avoid this.   Prior EGD: Never Prior colonoscopy: Never  Maternal grandmother and grandfather with colon cancer. No 1st degree relatives with colon cancer.   She would like to proceed with endoscopic evaluation as she wants answers for the cause of her pain.  Past Medical History:  Diagnosis Date   Abnormal pap    Anemia 10/20/2019   Add iron   Anxiety    Depression    Elevated triglycerides with high cholesterol 10/20/2019   Try lifestyle modifications and consider meds too   GERD (gastroesophageal reflux disease)    Hypertension    Vaginal Pap smear, abnormal     Past Surgical History:  Procedure Laterality Date   CESAREAN SECTION     CYSTOSCOPY W/ URETERAL STENT PLACEMENT Left 01/11/2020   Procedure: CYSTOSCOPY WITH RETROGRADE PYELOGRAM/URETERAL STENT PLACEMENT;  Surgeon: Malen Gauze, MD;  Location: [facility];  Service: Urology;  Laterality: Left;   ectopic pregnancies     HOLMIUM LASER APPLICATION Left 01/11/2020   Procedure: HOLMIUM LASER APPLICATION-ureteroscopy;  Surgeon: Malen Gauze, MD;  Location: [facility];  Service: Urology;  Laterality: Left;   left fallopian tube removal     right tube removed     history of ectopic pregnancies    Current Outpatient Medications  Medication Sig Dispense  Refill   alum & mag hydroxide-simeth (MAALOX MAX) 400-400-40 MG/5ML suspension Take 15 mLs by mouth every 6 (six) hours as needed for indigestion. 355 mL 0   dicyclomine (BENTYL) 20 MG tablet Take 1 tablet (20 mg total) by mouth 2 (two) times daily as needed for spasms. 20 tablet 0   esomeprazole (NEXIUM) 40 MG capsule Take 1 capsule (40 mg total) by mouth 2 (two) times daily before a meal. 30 capsule 3   hydrochlorothiazide (MICROZIDE) 12.5 MG capsule Take 1 capsule by mouth once daily 90 capsule 0   No current facility-administered medications for this visit.    Allergies as of 08/17/2022 - Review Complete 08/17/2022  Allergen Reaction Noted   Augmentin [amoxicillin-pot clavulanate] Other (See Comments) 06/12/2015    Family History  Problem Relation Age of Onset   Hypertension Mother    Cancer Maternal Grandmother        colon cancer; 50s-60s   Cancer Maternal Grandfather        colon cancer; 50s-60s    Social History   Socioeconomic History   Marital status: Legally Separated    Spouse name: Not on file   Number of children: 1   Years of education: Not on file   Highest education level: Not on file  Occupational History   Occupation: TA/Bus driver  Tobacco Use   Smoking status: Never   Smokeless tobacco: Never  Vaping Use   Vaping Use: Never used  Substance and Sexual Activity   Alcohol use: No   Drug use: Not Currently    Types: Benzodiazepines, Marijuana    Comment: opiates- Former   Sexual activity: Yes    Birth control/protection: Surgical    Comment: tubal  Other Topics Concern   Not on file  Social History Narrative   Not on file   Social Determinants of Health   Financial Resource Strain: Low Risk  (10/18/2019)   Overall Financial Resource Strain (CARDIA)    Difficulty of Paying Living Expenses: Not hard at all  Food Insecurity: No Food Insecurity (10/18/2019)   Hunger Vital Sign    Worried About Running Out of Food in the Last Year: Never true     Ran Out of Food in the Last Year: Never true  Transportation Needs: No Transportation Needs (10/18/2019)   PRAPARE - Administrator, Civil Service (Medical): No    Lack of Transportation (Non-Medical): No  Physical Activity: Inactive (10/18/2019)   Exercise Vital Sign    Days of Exercise per Week: 0 days    Minutes of Exercise per Session: 10 min  Stress: No Stress Concern Present (10/18/2019)   [employer] of Occupational Health - Occupational Stress Questionnaire    Feeling of Stress : Only a little  Social Connections: Moderately Integrated (10/18/2019)   Social Connection and Isolation Panel [NHANES]    Frequency of Communication with Friends and Family: More than three times a week    Frequency of Social Gatherings with Friends and Family: Once a week  Attends Religious Services: More than 4 times per year    Active Member of Clubs or Organizations: No    Attends Banker Meetings: Never    Marital Status: Married  Catering manager Violence: Not At Risk (10/18/2019)   Humiliation, Afraid, Rape, and Kick questionnaire    Fear of Current or Ex-Partner: No    Emotionally Abused: No    Physically Abused: No    Sexually Abused: No    Review of Systems: Gen: Denies any fever, chills, cold or flulike symptoms, presyncope, syncope. CV: Denies chest pain, heart palpitations. Resp: Denies shortness of breath, cough. GI: See HPI I GU : Denies urinary burning, urinary frequency, urinary hesitancy MS: Denies joint pain. Derm: Denies rash. Psych: Denies depression, anxiety. Heme: See HPI  Physical Exam: BP 128/77 (BP Location: Left Arm, Patient Position: Sitting, Cuff Size: Large)   Pulse 87   Temp (!) 97.5 F (36.4 C) (Temporal)   Ht 5' (1.524 m)   Wt 182 lb 12.8 oz (82.9 kg)   LMP 07/21/2022 (Exact Date)   BMI 35.70 kg/m  General:   Alert and oriented. Pleasant and cooperative. Well-nourished and well-developed.  Head:  Normocephalic and  atraumatic. Eyes:  Without icterus, sclera clear and conjunctiva pink.  Ears:  Normal auditory acuity. Lungs:  Clear to auscultation bilaterally. No wheezes, rales, or rhonchi. No distress.  Heart:  S1, S2 present without murmurs appreciated.  Abdomen:  +BS, soft, non-tender and non-distended. No HSM noted. No guarding or rebound. No masses appreciated.  Rectal:  Deferred  Msk:  Symmetrical without gross deformities. Normal posture. Extremities:  Without edema. Neurologic:  Alert and  oriented x4;  grossly normal neurologically. Skin:  Intact without significant lesions or rashes. Psych:   Normal mood and affect.    Assessment:  42 year old female with history of anxiety, depression, HTN, GERD, presenting today for further evaluation of LUQ abdominal pain.  LUQ abdominal pain: Intermittent and infrequent for the last 2 years, but increasing in frequency and intensity over the last few months. Evaluated in the ER x 2 with on significant laboratory abnormalities. No recent abdominal imaging on file, but abdominal exam benign today.  It is interesting that her pain primarily occurred at night, but this could have been secondary to her morning omeprazole wearing off.  Suspect symptoms are likely secondary to gastritis, duodenitis, PUD in the setting of chronic NSAID use for migraines.  Unable to rule out H. pylori, doubt malignancy.  Since starting esomeprazole 40 mg twice daily on 6/1, she has not had a flare of her symptoms.  She has also been avoiding NSAIDs since 6/1.  No brbpr, melena, or unintentional weight loss.  She is never had an EGD but would like to pursue this for definitive diagnosis of LUQ pain.  GERD: Chronic. Previously fairly well-controlled on OTC Prilosec.  Currently on esomeprazole 40 mg twice daily due to LUQ abdominal pain discussed above.  Constipation: Chronic, mild constipation skipping a couple days between bowel movements, then having several bowel movements the  following day.  No alarm symptoms.  Recommended starting Benefiber.  Can add Colace or MiraLAX daily if needed.    Plan:  Proceed with upper endoscopy with propofol by Dr. Marletta Lor in near future. The risks, benefits, and alternatives have been discussed with the patient in detail. The patient states understanding and desires to proceed.  ASA 2 UPT and BMP prior.  Continue Nexium 40 mg twice daily. Avoid all NSAIDs. Reinforced GERD diet/lifestyle.  Written instructions provided on AVS. Start Benefiber 2 teaspoons daily x 2 weeks, then increase to twice daily. If Benefiber does not resolve constipation, can add Colace 100-50 mg daily or MiraLAX 17 g daily. Follow-up in office after EGD.    Ermalinda Memos, PA-C [facility] 08/17/2022

## 2022-08-17 ENCOUNTER — Ambulatory Visit (INDEPENDENT_AMBULATORY_CARE_PROVIDER_SITE_OTHER): Payer: Self-pay | Admitting: Gastroenterology

## 2022-08-17 ENCOUNTER — Encounter: Payer: Self-pay | Admitting: Gastroenterology

## 2022-08-17 VITALS — BP 128/77 | HR 87 | Temp 97.5°F | Ht 60.0 in | Wt 182.8 lb

## 2022-08-17 DIAGNOSIS — R1012 Left upper quadrant pain: Secondary | ICD-10-CM

## 2022-08-17 DIAGNOSIS — K5900 Constipation, unspecified: Secondary | ICD-10-CM

## 2022-08-17 DIAGNOSIS — K219 Gastro-esophageal reflux disease without esophagitis: Secondary | ICD-10-CM

## 2022-08-17 NOTE — Patient Instructions (Signed)
We will arrange to have an upper endoscopy in the near future with Dr. Marletta Lor at [facility] to further evaluate your left upper abdominal pain.  Continue Nexium 40 mg twice daily 30 minutes before breakfast and dinner.  Avoid NSAID products including ibuprofen, Aleve, Advil, BC powders, Goody powders, and anything that says "NSAID" on the package.  Follow a GERD diet:  Avoid fried, fatty, greasy, spicy, citrus foods. Avoid caffeine and carbonated beverages. Avoid chocolate. Try eating 4-6 small meals a day rather than 3 large meals. Do not eat within 3 hours of laying down. Prop head of bed up on wood or bricks to create a 6 inch incline.  [place] constipation, start Benefiber 2 teaspoons daily x 2 weeks, then increase to twice daily.  If you continue to skip days between bowel movements, you can try adding Colace 100-200 mg daily or MiraLAX 17 g daily in 8 ounces of water.  We will follow-up with you in the office after your endoscopy.  Do not hesitate to call sooner if you have questions or concerns.  It was very nice to meet you today!  Ermalinda Memos, PA-C [facility]'

## 2022-08-18 ENCOUNTER — Telehealth: Payer: Self-pay | Admitting: *Deleted

## 2022-08-18 DIAGNOSIS — R1012 Left upper quadrant pain: Secondary | ICD-10-CM

## 2022-08-18 NOTE — Telephone Encounter (Signed)
Called pt, no answer and not able to leave a VM. Mychart message sent. Needs to schedule EGD with Dr. Marletta Lor, ASA 2, UPT/BMET prior.

## 2022-08-18 NOTE — Addendum Note (Signed)
Addended by: Armstead Peaks on: 08/18/2022 11:26 AM   Modules accepted: Orders

## 2022-08-19 ENCOUNTER — Other Ambulatory Visit: Payer: Self-pay | Admitting: Adult Health

## 2022-08-19 MED ORDER — HYDROCHLOROTHIAZIDE 12.5 MG PO CAPS: 12.5000 mg | ORAL_CAPSULE | Freq: Every day | ORAL | 0 refills | Status: DC

## 2022-08-19 NOTE — Telephone Encounter (Signed)
From: Darvin Neighbours To: Office of [name], [place] Sent: 08/18/2022 10:49 AM EDT Subject: Medication Renewal Request  Refills have been requested for the following medications:   hydrochlorothiazide (MICROZIDE) 12.5 MG capsule [Caitlain Tweed]  Preferred pharmacy: [facility] - [place], [place] - [address] Delivery method: [employer]

## 2022-08-19 NOTE — Telephone Encounter (Signed)
Refilled Microzide, needs appt

## 2022-09-09 ENCOUNTER — Encounter: Payer: Self-pay | Admitting: *Deleted

## 2022-09-11 ENCOUNTER — Other Ambulatory Visit (HOSPITAL_COMMUNITY)
Admission: RE | Admit: 2022-09-11 | Discharge: 2022-09-11 | Disposition: A | Discharge status: Home / Self Care | Payer: Self-pay | Source: Ambulatory Visit | Attending: Internal Medicine | Admitting: Internal Medicine

## 2022-09-11 DIAGNOSIS — R1012 Left upper quadrant pain: Secondary | ICD-10-CM | POA: Insufficient documentation

## 2022-09-11 LAB — PREGNANCY, URINE: Preg Test, Ur: NEGATIVE

## 2022-09-11 LAB — BASIC METABOLIC PANEL
Anion gap: 8 (ref 5–15)
BUN: 14 mg/dL (ref 6–20)
CO2: 25 mmol/L (ref 22–32)
Calcium: 8.5 mg/dL — ABNORMAL LOW (ref 8.9–10.3)
Chloride: 103 mmol/L (ref 98–111)
Creatinine, Ser: 0.82 mg/dL (ref 0.44–1.00)
GFR, Estimated: >60 mL/min (ref >60)
Glucose, Bld: 101 mg/dL — ABNORMAL HIGH (ref 70–99)
Potassium: 3.3 mmol/L — ABNORMAL LOW (ref 3.5–5.1)
Sodium: 136 mmol/L (ref 135–145)

## 2022-09-15 ENCOUNTER — Other Ambulatory Visit: Payer: Self-pay

## 2022-09-15 ENCOUNTER — Ambulatory Visit (HOSPITAL_COMMUNITY): Payer: Self-pay | Admitting: Certified Registered"

## 2022-09-15 ENCOUNTER — Ambulatory Visit (HOSPITAL_COMMUNITY)
Admission: RE | Admit: 2022-09-15 | Discharge: 2022-09-15 | Disposition: A | Discharge status: Home / Self Care | Payer: Self-pay | Attending: Internal Medicine | Admitting: Internal Medicine

## 2022-09-15 ENCOUNTER — Encounter (HOSPITAL_COMMUNITY): Payer: Self-pay

## 2022-09-15 ENCOUNTER — Ambulatory Visit (HOSPITAL_BASED_OUTPATIENT_CLINIC_OR_DEPARTMENT_OTHER): Payer: Self-pay | Admitting: Certified Registered"

## 2022-09-15 ENCOUNTER — Encounter (HOSPITAL_COMMUNITY)
Admission: RE | Disposition: A | Discharge status: Home / Self Care | Payer: Self-pay | Source: Home / Self Care | Attending: Internal Medicine

## 2022-09-15 DIAGNOSIS — K2289 Other specified disease of esophagus: Secondary | ICD-10-CM | POA: Insufficient documentation

## 2022-09-15 DIAGNOSIS — K219 Gastro-esophageal reflux disease without esophagitis: Secondary | ICD-10-CM | POA: Insufficient documentation

## 2022-09-15 DIAGNOSIS — K2281 Esophageal polyp: Secondary | ICD-10-CM

## 2022-09-15 DIAGNOSIS — Z800 Family history of malignant neoplasm of digestive organs: Secondary | ICD-10-CM | POA: Insufficient documentation

## 2022-09-15 DIAGNOSIS — K2970 Gastritis, unspecified, without bleeding: Secondary | ICD-10-CM | POA: Insufficient documentation

## 2022-09-15 DIAGNOSIS — Z79899 Other long term (current) drug therapy: Secondary | ICD-10-CM | POA: Insufficient documentation

## 2022-09-15 DIAGNOSIS — K5909 Other constipation: Secondary | ICD-10-CM | POA: Insufficient documentation

## 2022-09-15 HISTORY — PX: ESOPHAGOGASTRODUODENOSCOPY (EGD) WITH PROPOFOL: SHX5813

## 2022-09-15 HISTORY — PX: POLYPECTOMY: SHX5525

## 2022-09-15 HISTORY — PX: BIOPSY: SHX5522

## 2022-09-15 SURGERY — ESOPHAGOGASTRODUODENOSCOPY (EGD) WITH PROPOFOL
Anesthesia: General

## 2022-09-15 MED ORDER — LIDOCAINE HCL (CARDIAC) PF 100 MG/5ML IV SOSY
PREFILLED_SYRINGE | INTRAVENOUS | Status: DC | PRN
  Administered 2022-09-15: 50 mg via INTRAVENOUS

## 2022-09-15 MED ORDER — LACTATED RINGERS IV SOLN: INTRAVENOUS | Status: DC | PRN

## 2022-09-15 MED ORDER — PROPOFOL 10 MG/ML IV BOLUS
INTRAVENOUS | Status: DC | PRN
  Administered 2022-09-15: 50 mg via INTRAVENOUS
  Administered 2022-09-15: 100 mg via INTRAVENOUS
  Administered 2022-09-15: 50 mg via INTRAVENOUS
  Administered 2022-09-15: 100 mg via INTRAVENOUS

## 2022-09-15 NOTE — Anesthesia Procedure Notes (Signed)
Date/Time: 09/15/2022 9:50 AM  Performed by: Julian Reil, CRNAPre-anesthesia Checklist: Patient identified, Emergency Drugs available, Suction available and Patient being monitored Patient Re-evaluated:Patient Re-evaluated prior to induction Oxygen Delivery Method: Nasal cannula Induction Type: IV induction Placement Confirmation: positive ETCO2

## 2022-09-15 NOTE — Interval H&P Note (Signed)
History and Physical Interval Note:  09/15/2022 9:40 AM  [name]  has presented today for surgery, with the diagnosis of luq PAIN,.  The various methods of treatment have been discussed with the patient and family. After consideration of risks, benefits and other options for treatment, the patient has consented to  Procedure(s) with comments: ESOPHAGOGASTRODUODENOSCOPY (EGD) WITH PROPOFOL (N/A) - 1015am, asa 2 as a surgical intervention.  The patient's history has been reviewed, patient examined, no change in status, stable for surgery.  I have reviewed the patient's chart and labs.  Questions were answered to the patient's satisfaction.     Lanelle Bal

## 2022-09-15 NOTE — Op Note (Signed)
[facility] Patient Name: [name] Procedure Date: 09/15/2022 9:39 AM MRN: [id number] Date of Birth: May 15, 1980 Attending MD: Hennie Duos. Marletta Lor , [place], [id number] CSN: [id number] Age: 42 Admit Type: Outpatient Procedure:                Upper GI endoscopy Indications:              Abdominal pain in the left lower quadrant, Nausea Providers:                Hennie Duos. Marletta Lor, DO, Angelica Ran, Zena Amos Referring MD:             Hennie Duos. Marletta Lor, DO Medicines:                See the Anesthesia note for documentation of the                            administered medications Complications:            No immediate complications. Estimated Blood Loss:     Estimated blood loss was minimal. Procedure:                Pre-Anesthesia Assessment:                           - The anesthesia plan was to use monitored                            anesthesia care (MAC).                           After obtaining informed consent, the endoscope was                            passed under direct vision. Throughout the                            procedure, the patient's blood pressure, pulse, and                            oxygen saturations were monitored continuously. The                            [id number]-H190 ([id number]) scope was introduced through the                            mouth, and advanced to the second part of duodenum.                            The upper GI endoscopy was accomplished without                            difficulty. The patient tolerated the procedure                            well. Scope In: 9:49:53 AM Scope Out: 9:56:22 AM Total Procedure Duration: 0 hours 6 minutes 29 seconds  Findings:  The Z-line was irregular and was found 36 cm from the incisors. Biopsies       were taken with a cold forceps for histology.      A single 3-4 mm polyp with no bleeding was found 20 cm from the       incisors. The polyp was removed with a cold biopsy forceps. Resection        and retrieval were complete.      Localized mild inflammation characterized by erosions and erythema was       found in the gastric body. Biopsies were taken with a cold forceps for       Helicobacter pylori testing.      The duodenal bulb, first portion of the duodenum and second portion of       the duodenum were normal. Impression:               - Z-line irregular, 36 cm from the incisors.                            Biopsied.                           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 Resected and                            retrieved.                           - Gastritis. Biopsied.                           - Normal duodenal bulb, first portion of the                            duodenum and second portion of the duodenum. Moderate Sedation:      Per Anesthesia Care Recommendation:           - Patient has a contact number available for                            emergencies. The signs and symptoms of potential                            delayed complications were discussed with the                            patient. Return to normal activities tomorrow.                            Written discharge instructions were provided to the                            patient.                           - Resume previous diet.                           -  Continue present medications.                           - Await pathology results.                           - Use a proton pump inhibitor PO BID.                           - No ibuprofen, naproxen, or other non-steroidal                            anti-inflammatory drugs.                           - Return to GI clinic in 2 months. Procedure Code(s):        --- Professional ---                           [phone], Esophagogastroduodenoscopy, flexible,                            transoral; with biopsy, single or multiple Diagnosis Code(s):        --- Professional ---                           K22.89, Other specified disease of esophagus                            K22.81, Esophageal polyp                           K29.70, Gastritis, unspecified, without bleeding                           R10.32, Left lower quadrant pain                           R11.0, Nausea CPT copyright 2022 American Medical Association. All rights reserved. The codes documented in this report are preliminary and upon coder review may  be revised to meet current compliance requirements. Hennie Duos. Marletta Lor, DO Hennie Duos. Marletta Lor, DO 09/15/2022 10:00:44 AM This report has been signed electronically. Number of Addenda: 0

## 2022-09-15 NOTE — Anesthesia Preprocedure Evaluation (Signed)
Anesthesia Evaluation  Patient identified by MRN, date of birth, ID band Patient awake    Reviewed: Allergy & Precautions, H&P , NPO status , Patient's Chart, lab work & pertinent test results, reviewed documented beta blocker date and time   Airway Mallampati: II  TM Distance: >3 FB Neck ROM: full    Dental no notable dental hx.    Pulmonary neg pulmonary ROS   Pulmonary exam normal breath sounds clear to auscultation       Cardiovascular Exercise Tolerance: Good hypertension, negative cardio ROS  Rhythm:regular Rate:Normal     Neuro/Psych  Headaches PSYCHIATRIC DISORDERS Anxiety Depression    negative neurological ROS  negative psych ROS   GI/Hepatic negative GI ROS, Neg liver ROS,GERD  ,,  Endo/Other  negative endocrine ROS    Renal/GU Renal diseasenegative Renal ROS  negative genitourinary   Musculoskeletal   Abdominal   Peds  Hematology negative hematology ROS (+) Blood dyscrasia, anemia   Anesthesia Other Findings   Reproductive/Obstetrics negative OB ROS                             Anesthesia Physical Anesthesia Plan  ASA: 2  Anesthesia Plan: General   Post-op Pain Management:    Induction:   PONV Risk Score and Plan: Propofol infusion  Airway Management Planned:   Additional Equipment:   Intra-op Plan:   Post-operative Plan:   Informed Consent: I have reviewed the patients History and Physical, chart, labs and discussed the procedure including the risks, benefits and alternatives for the proposed anesthesia with the patient or authorized representative who has indicated his/her understanding and acceptance.     Dental Advisory Given  Plan Discussed with: CRNA  Anesthesia Plan Comments:        Anesthesia Quick Evaluation

## 2022-09-15 NOTE — Discharge Instructions (Addendum)
EGD Discharge instructions Please read the instructions outlined below and refer to this sheet in the next few weeks. These discharge instructions provide you with general information on caring for yourself after you leave the hospital. Your doctor may also give you specific instructions. While your treatment has been planned according to the most current medical practices available, unavoidable complications occasionally occur. If you have any problems or questions after discharge, please call your doctor. ACTIVITY You may resume your regular activity but move at a slower pace for the next 24 hours.  Take frequent rest periods for the next 24 hours.  Walking will help expel (get rid of) the air and reduce the bloated feeling in your abdomen.  No driving for 24 hours (because of the anesthesia (medicine) used during the test).  You may shower.  Do not sign any important legal documents or operate any machinery for 24 hours (because of the anesthesia used during the test).  NUTRITION Drink plenty of fluids.  You may resume your normal diet.  Begin with a light meal and progress to your normal diet.  Avoid alcoholic beverages for 24 hours or as instructed by your caregiver.  MEDICATIONS You may resume your normal medications unless your caregiver tells you otherwise.  WHAT YOU CAN EXPECT TODAY You may experience abdominal discomfort such as a feeling of fullness or "gas" pains.  FOLLOW-UP Your doctor will discuss the results of your test with you.  SEEK IMMEDIATE MEDICAL ATTENTION IF ANY OF THE FOLLOWING OCCUR: Excessive nausea (feeling sick to your stomach) and/or vomiting.  Severe abdominal pain and distention (swelling).  Trouble swallowing.  Temperature over 101 F (37.8 C).  Rectal bleeding or vomiting of blood.    Your EGD revealed mild amount inflammation in your stomach.  I took biopsies of this to rule out infection with a bacteria called H. pylori.    I also took samples of  your esophagus to screen for Barrett's esophagus.  You had a very small polyp in the upper portion of your esophagus as well which I removed.  Small bowel appeared normal  Await pathology results, my office will contact you.  Continue on Nexium twice daily.  Follow-up in GI office in 6 to 8 weeks.  I hope you have a great rest of your week!  Hennie Duos. Marletta Lor, D.O. Gastroenterology and Hepatology [facility]

## 2022-09-15 NOTE — Transfer of Care (Signed)
Immediate Anesthesia Transfer of Care Note  Patient: [name]  Procedure(s) Performed: ESOPHAGOGASTRODUODENOSCOPY (EGD) WITH PROPOFOL BIOPSY POLYPECTOMY  Patient Location: Endoscopy Unit  Anesthesia Type:General  Level of Consciousness: drowsy  Airway & Oxygen Therapy: Patient Spontanous Breathing  Post-op Assessment: Report given to RN and Post -op Vital signs reviewed and stable  Post vital signs: Reviewed and stable  Last Vitals:  Vitals Value Taken Time  BP    Temp    Pulse    Resp    SpO2      Last Pain:  Vitals:   09/15/22 0947  TempSrc:   PainSc: 0-No pain      Patients Stated Pain Goal: 4 (09/15/22 0906)  Complications: No notable events documented.

## 2022-09-16 LAB — SURGICAL PATHOLOGY

## 2022-09-17 NOTE — Anesthesia Postprocedure Evaluation (Signed)
Anesthesia Post Note  Patient: Anastacia Reinecke  Procedure(s) Performed: ESOPHAGOGASTRODUODENOSCOPY (EGD) WITH PROPOFOL BIOPSY POLYPECTOMY  Patient location during evaluation: Phase II Anesthesia Type: General Level of consciousness: awake Pain management: pain level controlled Vital Signs Assessment: post-procedure vital signs reviewed and stable Respiratory status: spontaneous breathing and respiratory function stable Cardiovascular status: blood pressure returned to baseline and stable Postop Assessment: no headache and no apparent nausea or vomiting Anesthetic complications: no Comments: Late entry   No notable events documented.   Last Vitals:  Vitals:   09/15/22 0918 09/15/22 0958  BP: (!) 141/82 139/67  Pulse: 69 82  Resp: (!) 21   Temp: 37.1 C 37 C  SpO2: 98% 97%    Last Pain:  Vitals:   09/15/22 0958  TempSrc: Oral  PainSc:                  Windell Norfolk

## 2022-09-18 ENCOUNTER — Encounter (HOSPITAL_COMMUNITY): Payer: Self-pay | Admitting: Internal Medicine

## 2022-10-19 ENCOUNTER — Encounter: Payer: Self-pay | Admitting: Internal Medicine

## 2022-11-09 ENCOUNTER — Encounter: Payer: Self-pay | Admitting: *Deleted

## 2023-03-01 ENCOUNTER — Emergency Department (HOSPITAL_COMMUNITY): Payer: Self-pay

## 2023-03-01 ENCOUNTER — Encounter (HOSPITAL_COMMUNITY): Payer: Self-pay

## 2023-03-01 ENCOUNTER — Emergency Department (HOSPITAL_COMMUNITY)
Admission: EM | Admit: 2023-03-01 | Discharge: 2023-03-01 | Disposition: A | Discharge status: Home / Self Care | Payer: Self-pay | Attending: Student | Admitting: Student

## 2023-03-01 ENCOUNTER — Other Ambulatory Visit: Payer: Self-pay

## 2023-03-01 DIAGNOSIS — Z20822 Contact with and (suspected) exposure to covid-19: Secondary | ICD-10-CM | POA: Insufficient documentation

## 2023-03-01 DIAGNOSIS — I10 Essential (primary) hypertension: Secondary | ICD-10-CM | POA: Insufficient documentation

## 2023-03-01 DIAGNOSIS — J069 Acute upper respiratory infection, unspecified: Secondary | ICD-10-CM | POA: Insufficient documentation

## 2023-03-01 DIAGNOSIS — R519 Headache, unspecified: Secondary | ICD-10-CM

## 2023-03-01 LAB — RESP PANEL BY RT-PCR (RSV, FLU A&B, COVID)  RVPGX2
Influenza A by PCR: POSITIVE — AB
Influenza B by PCR: NEGATIVE
Resp Syncytial Virus by PCR: NEGATIVE
SARS Coronavirus 2 by RT PCR: NEGATIVE

## 2023-03-01 MED ORDER — DIPHENHYDRAMINE HCL 50 MG/ML IJ SOLN
25.0000 mg | Freq: Once | INTRAMUSCULAR | Status: AC
  Administered 2023-03-01: 25 mg via INTRAVENOUS
  Filled 2023-03-01: qty 1

## 2023-03-01 MED ORDER — CEFDINIR 300 MG PO CAPS: 300.0000 mg | ORAL_CAPSULE | Freq: Two times a day (BID) | ORAL | 0 refills | Status: DC

## 2023-03-01 MED ORDER — METOCLOPRAMIDE HCL 5 MG/ML IJ SOLN
10.0000 mg | Freq: Once | INTRAMUSCULAR | Status: AC
  Administered 2023-03-01: 10 mg via INTRAVENOUS
  Filled 2023-03-01: qty 2

## 2023-03-01 MED ORDER — KETOROLAC TROMETHAMINE 15 MG/ML IJ SOLN
15.0000 mg | Freq: Once | INTRAMUSCULAR | Status: AC
  Administered 2023-03-01: 15 mg via INTRAVENOUS
  Filled 2023-03-01: qty 1

## 2023-03-01 MED ORDER — GUAIFENESIN 100 MG/5ML PO LIQD: 5.0000 mL | ORAL | 0 refills | Status: DC | PRN
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
## 2023-03-01 NOTE — Discharge Instructions (Addendum)
 As discussed, CT scan of your head appears normal.  Regarding cough and nasal congestion since yesterday, suspect you have a upper respiratory viral infection.  Follow MyChart for the results of your COVID/flu testing.  Will recommend allergy medicine such as Zyrtec/creatinine/Allegra as well as nasal steroid spray such as Nasacort/Flonase for treatment of your symptoms.  Will also send in cough suppressant to use as needed.  Recommend follow-up with primary care for reassessment of your symptoms.  Please not hesitate to return if the worrisome signs and symptoms we discussed to become apparent.
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
## 2023-08-10 ENCOUNTER — Ambulatory Visit
Admission: RE | Admit: 2023-08-10 | Discharge: 2023-08-10 | Disposition: A | Discharge status: Home / Self Care | Payer: Self-pay | Source: Ambulatory Visit | Attending: Nurse Practitioner

## 2023-08-10 VITALS — BP 152/91 | HR 78 | Temp 98.6°F | Resp 18

## 2023-08-10 DIAGNOSIS — B9689 Other specified bacterial agents as the cause of diseases classified elsewhere: Secondary | ICD-10-CM

## 2023-08-10 DIAGNOSIS — J019 Acute sinusitis, unspecified: Secondary | ICD-10-CM

## 2023-08-10 DIAGNOSIS — R110 Nausea: Secondary | ICD-10-CM

## 2023-08-10 MED ORDER — ONDANSETRON 4 MG PO TBDP: 4.0000 mg | ORAL_TABLET | Freq: Three times a day (TID) | ORAL | 0 refills | Status: DC | PRN

## 2023-08-10 MED ORDER — DOXYCYCLINE HYCLATE 100 MG PO CAPS: 100.0000 mg | ORAL_CAPSULE | Freq: Two times a day (BID) | ORAL | 0 refills | Status: AC

## 2023-08-10 NOTE — Discharge Instructions (Signed)
 You are being treated today for sinus infection.  Take the doxycycline twice daily for 7 days as prescribed to treat it.  Additionally, recommend hydration with plenty of water , nasal saline spray to help loosen the congestion, and guaifenesin  600 mg twice daily as needed to help with the congestion as well.  You can take the Zofran  every 8 hours as needed for nausea/vomiting.  Seek care if symptoms do not improve with treatment.

## 2023-08-10 NOTE — ED Triage Notes (Signed)
 Pt presents to UC for c/o nausea starting this morning, ear pain, sinus pain for over a week. Taking tylenol .

## 2023-08-10 NOTE — ED Provider Notes (Signed)
 [facility]    CSN: [id number] Arrival date & time: 08/10/23  1125      History   Chief Complaint Chief Complaint  Patient presents with   Nausea    Sinus and ear pain - Entered by patient    HPI [name] is a 43 y.o. female.   Patient presents today with more than 1 week history of right sided sinus pain, nasal congestion, thick drainage when she blows her nose, headache on the right side, right sided ear pain, and nausea.  She denies fever, body aches or chills, cough, shortness of breath or chest pain, runny nose, sore throat, abdominal pain, vomiting, diarrhea, and significantly changed appetite.  No known sick contacts.  Has taken Tylenol  for the headache without much improvement.    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   Elevated triglycerides with high cholesterol 10/20/2019   Anemia 10/20/2019   Dyspareunia, female 10/18/2019   Encounter for gynecological examination with Papanicolaou smear of cervix 10/18/2019   Frequent headaches 10/18/2019   Tired 10/18/2019   Elevated BP without diagnosis of hypertension 10/18/2019   History of abnormal cervical Pap smear 10/18/2019   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   Gastroesophageal reflux disease without esophagitis 03/17/2016   Gastritis and gastroduodenitis 10/02/2013   Depression with anxiety 06/22/2013    Past Surgical History:  Procedure Laterality Date   BIOPSY  09/15/2022   Procedure: BIOPSY;  Surgeon: Vinetta Greening, DO;  Location: [facility];  Service: Endoscopy;;    CESAREAN SECTION     CYSTOSCOPY W/ URETERAL STENT PLACEMENT Left 01/11/2020   Procedure: CYSTOSCOPY WITH RETROGRADE PYELOGRAM/URETERAL STENT PLACEMENT;  Surgeon: Marco Severs, MD;  Location: [facility];  Service: Urology;  Laterality: Left;   ectopic pregnancies     ESOPHAGOGASTRODUODENOSCOPY (EGD) WITH PROPOFOL  N/A 09/15/2022   Procedure: ESOPHAGOGASTRODUODENOSCOPY (EGD) WITH PROPOFOL ;  Surgeon: Vinetta Greening, DO;  Location: [facility];  Service: Endoscopy;  Laterality: N/A;  1015am, asa 2   HOLMIUM LASER APPLICATION Left 01/11/2020   Procedure: HOLMIUM LASER APPLICATION-ureteroscopy;  Surgeon: Marco Severs, MD;  Location: [facility];  Service: Urology;  Laterality: Left;   left fallopian tube removal     POLYPECTOMY  09/15/2022   Procedure: POLYPECTOMY;  Surgeon: Vinetta Greening, DO;  Location: [facility];  Service: Endoscopy;;   right tube removed     history of ectopic pregnancies    OB History     Gravida  4   Para  1   Term  1   Preterm      AB  3   Living  1      SAB      IAB      Ectopic  3   Multiple      Live Births               Home Medications    Prior to Admission medications   Medication Sig Start Date End Date Taking? Authorizing Provider  doxycycline (VIBRAMYCIN) 100  MG capsule Take 1 capsule (100 mg total) by mouth 2 (two) times daily for 7 days. 08/10/23 08/17/23 Yes Wilhemena Harbour, NP  hydrochlorothiazide  (MICROZIDE ) 12.5 MG capsule Take 1 capsule (12.5 mg total) by mouth daily. 08/19/22  Yes Javan Messing, NP  ondansetron  (ZOFRAN -ODT) 4 MG disintegrating tablet Take 1 tablet (4 mg total) by mouth every 8 (eight) hours as needed for nausea or vomiting. 08/10/23  Yes Thena Fireman A, NP  acetaminophen  (TYLENOL ) 500 MG tablet Take 500-1,000 mg by mouth every 6 (six) hours as needed (pain.).    [provider]  dicyclomine  (BENTYL ) 20 MG tablet Take 1 tablet (20 mg total) by mouth 2 (two) times daily as needed  for spasms. 07/25/22   Kommor, Madison, MD  esomeprazole  (NEXIUM ) 40 MG capsule Take 1 capsule (40 mg total) by mouth 2 (two) times daily before a meal. 07/25/22 09/23/22  Kommor, Madison, MD  guaiFENesin  (ROBITUSSIN) 100 MG/5ML liquid Take 5 mLs by mouth every 4 (four) hours as needed for cough or to loosen phlegm. 03/01/23   Alligator Butter, PA  naphazoline-glycerin (CLEAR EYES REDNESS) 0.012-0.25 % SOLN Place 1-2 drops into both eyes 4 (four) times daily as needed for eye irritation.    [provider]    Family History Family History  Problem Relation Age of Onset   Hypertension Mother    Cancer Maternal Grandmother        colon cancer; 50s-60s   Cancer Maternal Grandfather        colon cancer; 58s-60s    Social History Social History   Tobacco Use   Smoking status: Never   Smokeless tobacco: Never  Vaping Use   Vaping status: Never Used  Substance Use Topics   Alcohol use: No   Drug use: Not Currently    Types: Benzodiazepines, Marijuana    Comment: opiates- Former     Allergies   Augmentin  [amoxicillin -pot clavulanate]   Review of Systems Review of Systems Per HPI  Physical Exam Triage Vital Signs ED Triage Vitals [08/10/23 1133]  Encounter Vitals Group     BP      Girls Systolic BP Percentile      Girls Diastolic BP Percentile      Boys Systolic BP Percentile      Boys Diastolic BP Percentile      Pulse      Resp      Temp      Temp src      SpO2      Weight      Height      Head Circumference      Peak Flow      Pain Score 5     Pain Loc      Pain Education      Exclude from Growth Chart    No data found.  Updated Vital Signs BP (!) 152/91 (BP Location: Right Arm)   Pulse 78   Temp 98.6 F (37 C) (Oral)   Resp 18   SpO2 98%   Visual Acuity Right Eye Distance:   Left Eye Distance:   Bilateral Distance:    Right Eye Near:   Left Eye Near:    Bilateral Near:     Physical Exam Vitals and nursing note reviewed.   Constitutional:      General: She is not in acute distress.    Appearance: Normal appearance. She is not ill-appearing or toxic-appearing.  HENT:     Head: Normocephalic  and atraumatic.     Right Ear: Tympanic membrane, ear canal and external ear normal. There is impacted cerumen.     Left Ear: Tympanic membrane, ear canal and external ear normal. There is impacted cerumen.     Ears:     Comments: 5% of TM visualized through cerumen and TM not erythematous or bulging    Nose: Congestion present. No rhinorrhea.     Right Sinus: Maxillary sinus tenderness and frontal sinus tenderness present.     Left Sinus: No maxillary sinus tenderness or frontal sinus tenderness.     Mouth/Throat:     Mouth: Mucous membranes are moist.     Pharynx: Oropharynx is clear. No oropharyngeal exudate or posterior oropharyngeal erythema.   Eyes:     General: No scleral icterus.    Extraocular Movements: Extraocular movements intact.    Cardiovascular:     Rate and Rhythm: Normal rate and regular rhythm.  Pulmonary:     Effort: Pulmonary effort is normal. No respiratory distress.     Breath sounds: Normal breath sounds. No wheezing, rhonchi or rales.  Abdominal:     General: Bowel sounds are normal.   Musculoskeletal:     Cervical back: Normal range of motion and neck supple.  Lymphadenopathy:     Cervical: No cervical adenopathy.   Skin:    General: Skin is warm and dry.     Capillary Refill: Capillary refill takes less than 2 seconds.     Coloration: Skin is not jaundiced or pale.     Findings: No erythema or rash.   Neurological:     Mental Status: She is alert and oriented to person, place, and time.     Motor: No weakness.   Psychiatric:        Behavior: Behavior is cooperative.      UC Treatments / Results  Labs (all labs ordered are listed, but only abnormal results are displayed) Labs Reviewed - No data to display  EKG   Radiology No results  found.  Procedures Procedures (including critical care time)  Medications Ordered in UC Medications - No data to display  Initial Impression / Assessment and Plan / UC Course  I have reviewed the triage vital signs and the nursing notes.  Pertinent labs & imaging results that were available during my care of the patient were reviewed by me and considered in my medical decision making (see chart for details).   Patient is mildly hypertensive in triage today, otherwise vital signs are stable.  1. Acute bacterial sinusitis 2. Nausea without vomiting Treat with doxycycline twice daily for 7 days given allergy to Augmentin  Also recommended Zofran  as needed for nausea/vomiting, push hydration, guaifenesin  for congestion, and nasal saline rinses ER and return precautions discussed Work excuse provided  The patient was given the opportunity to ask questions.  All questions answered to their satisfaction.  The patient is in agreement to this plan.   Final Clinical Impressions(s) / UC Diagnoses   Final diagnoses:  Acute bacterial sinusitis  Nausea without vomiting     Discharge Instructions      You are being treated today for sinus infection.  Take the doxycycline twice daily for 7 days as prescribed to treat it.  Additionally, recommend hydration with plenty of water , nasal saline spray to help loosen the congestion, and guaifenesin  600 mg twice daily as needed to help with the congestion as well.  You can take the Zofran  every 8 hours as needed for nausea/vomiting.  Seek care if symptoms do not improve with treatment.     ED Prescriptions     Medication Sig Dispense Auth. Provider   doxycycline (VIBRAMYCIN) 100 MG capsule Take 1 capsule (100 mg total) by mouth 2 (two) times daily for 7 days. 14 capsule Thena Fireman A, NP   ondansetron  (ZOFRAN -ODT) 4 MG disintegrating tablet Take 1 tablet (4 mg total) by mouth every 8 (eight) hours as needed for nausea or vomiting. 20 tablet  Wilhemena Harbour, NP      PDMP not reviewed this encounter.   Wilhemena Harbour, NP 08/10/23 1157

## 2023-09-09 ENCOUNTER — Ambulatory Visit
Admission: EM | Admit: 2023-09-09 | Discharge: 2023-09-09 | Disposition: A | Discharge status: Home / Self Care | Payer: Self-pay | Attending: Family Medicine | Admitting: Family Medicine

## 2023-09-09 DIAGNOSIS — H6123 Impacted cerumen, bilateral: Secondary | ICD-10-CM

## 2023-09-09 NOTE — ED Provider Notes (Signed)
 [facility]    CSN: [id number] Arrival date & time: 09/09/23  1741      History   Chief Complaint No chief complaint on file.   HPI [name] is a 43 y.o. female.   Patient presenting today with 1 day history of right ear fullness, muffled hearing, some much milder symptoms on the left as well.  Denies fever, chills, congestion, drainage or bleeding from the ears.  Tried to clean the ears with Q-tips at home but feels like she pushed wax further down instead.    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   Elevated triglycerides with high cholesterol 10/20/2019   Anemia 10/20/2019   Dyspareunia, female 10/18/2019   Encounter for gynecological examination with Papanicolaou smear of cervix 10/18/2019   Frequent headaches 10/18/2019   Tired 10/18/2019   Elevated BP without diagnosis of hypertension 10/18/2019   History of abnormal cervical Pap smear 10/18/2019   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   Gastroesophageal reflux disease without esophagitis 03/17/2016   Gastritis and gastroduodenitis 10/02/2013   Depression with anxiety 06/22/2013    Past Surgical History:  Procedure Laterality Date   BIOPSY  09/15/2022   Procedure: BIOPSY;  Surgeon: Cindie Carlin POUR, DO;  Location: [facility];  Service: Endoscopy;;   CESAREAN SECTION     CYSTOSCOPY W/ URETERAL STENT PLACEMENT Left 01/11/2020   Procedure: CYSTOSCOPY WITH RETROGRADE PYELOGRAM/URETERAL STENT PLACEMENT;  Surgeon: Sherrilee Belvie CROME, MD;  Location: [facility];  Service: Urology;   Laterality: Left;   ectopic pregnancies     ESOPHAGOGASTRODUODENOSCOPY (EGD) WITH PROPOFOL  N/A 09/15/2022   Procedure: ESOPHAGOGASTRODUODENOSCOPY (EGD) WITH PROPOFOL ;  Surgeon: Cindie Carlin POUR, DO;  Location: [facility];  Service: Endoscopy;  Laterality: N/A;  1015am, asa 2   HOLMIUM LASER APPLICATION Left 01/11/2020   Procedure: HOLMIUM LASER APPLICATION-ureteroscopy;  Surgeon: Sherrilee Belvie CROME, MD;  Location: [facility];  Service: Urology;  Laterality: Left;   left fallopian tube removal     POLYPECTOMY  09/15/2022   Procedure: POLYPECTOMY;  Surgeon: Cindie Carlin POUR, DO;  [facility]: [facility];  Service: Endoscopy;;   right tube removed     history of ectopic pregnancies    OB History     Gravida  4   Para  1   Term  1   Preterm      AB  3   Living  1      SAB      IAB      Ectopic  3   Multiple      Live Births               Home Medications    Prior to Admission medications   Medication Sig Start Date End Date Taking? Authorizing Provider  acetaminophen  (TYLENOL ) 500 MG tablet Take 500-1,000 mg by mouth every 6 (six) hours as needed (pain.).    [provider]  dicyclomine  (BENTYL ) 20 MG tablet Take 1 tablet (20 mg total) by mouth 2 (two) times daily as needed  for spasms. 07/25/22   Kommor, Lum, MD  esomeprazole  (NEXIUM ) 40 MG capsule Take 1 capsule (40 mg total) by mouth 2 (two) times daily before a meal. 07/25/22 09/23/22  Kommor, Madison, MD  guaiFENesin  (ROBITUSSIN) 100 MG/5ML liquid Take 5 mLs by mouth every 4 (four) hours as needed for cough or to loosen phlegm. 03/01/23   Silver Wonda LABOR, PA  hydrochlorothiazide  (MICROZIDE ) 12.5 MG capsule Take 1 capsule (12.5 mg total) by mouth daily. 08/19/22   Signa Delon LABOR, NP  naphazoline-glycerin (CLEAR EYES REDNESS) 0.012-0.25 % SOLN Place 1-2 drops into both eyes 4 (four) times daily as needed for eye irritation.    [provider]  ondansetron  (ZOFRAN -ODT) 4 MG disintegrating  tablet Take 1 tablet (4 mg total) by mouth every 8 (eight) hours as needed for nausea or vomiting. 08/10/23   Chandra Harlene LABOR, NP    Family History Family History  Problem Relation Age of Onset   Hypertension Mother    Cancer Maternal Grandmother        colon cancer; 50s-60s   Cancer Maternal Grandfather        colon cancer; 21s-60s    Social History Social History   Tobacco Use   Smoking status: Never   Smokeless tobacco: Never  Vaping Use   Vaping status: Never Used  Substance Use Topics   Alcohol use: No   Drug use: Not Currently    Types: Benzodiazepines, Marijuana    Comment: opiates- Former     Allergies   Augmentin  [amoxicillin -pot clavulanate]   Review of Systems Review of Systems Per HPI  Physical Exam Triage Vital Signs ED Triage Vitals  Encounter Vitals Group     BP 09/09/23 1744 (!) 175/88     Girls Systolic BP Percentile --      Girls Diastolic BP Percentile --      Boys Systolic BP Percentile --      Boys Diastolic BP Percentile --      Pulse Rate 09/09/23 1744 86     Resp 09/09/23 1744 20     Temp 09/09/23 1744 98.5 F (36.9 C)     Temp Source 09/09/23 1744 Oral     SpO2 09/09/23 1744 98 %     Weight --      Height --      Head Circumference --      Peak Flow --      Pain Score 09/09/23 1746 5     Pain Loc --      Pain Education --      Exclude from Growth Chart --    No data found.  Updated Vital Signs BP (!) 175/88 (BP Location: Right Arm)   Pulse 86   Temp 98.5 F (36.9 C) (Oral)   Resp 20   SpO2 98%   Visual Acuity Right Eye Distance:   Left Eye Distance:   Bilateral Distance:    Right Eye Near:   Left Eye Near:    Bilateral Near:     Physical Exam Vitals and nursing note reviewed.  Constitutional:      Appearance: Normal appearance. She is not ill-appearing.  HENT:     Head: Atraumatic.     Right Ear: There is impacted cerumen.     Left Ear: There is impacted cerumen.  Eyes:     Extraocular Movements:  Extraocular movements intact.     Conjunctiva/sclera: Conjunctivae normal.  Cardiovascular:     Rate and Rhythm: Normal rate.  Pulmonary:  Effort: Pulmonary effort is normal.  Musculoskeletal:        General: Normal range of motion.     Cervical back: Normal range of motion and neck supple.  Skin:    General: Skin is warm and dry.  Neurological:     Mental Status: She is alert and oriented to person, place, and time.  Psychiatric:        Mood and Affect: Mood normal.        Thought Content: Thought content normal.        Judgment: Judgment normal.      UC Treatments / Results  Labs (all labs ordered are listed, but only abnormal results are displayed) Labs Reviewed - No data to display  EKG   Radiology No results found.  Procedures Procedures (including critical care time)  Medications Ordered in UC Medications - No data to display  Initial Impression / Assessment and Plan / UC Course  I have reviewed the triage vital signs and the nursing notes.  Pertinent labs & imaging results that were available during my care of the patient were reviewed by me and considered in my medical decision making (see chart for details).     Ear lavage performed with warm water  and peroxide to bilateral ears.  TMs visualized and benign postprocedure.  Return precautions reviewed.  Final Clinical Impressions(s) / UC Diagnoses   Final diagnoses:  Bilateral impacted cerumen   Discharge Instructions   None    ED Prescriptions   None    PDMP not reviewed this encounter.   Stuart Vernell Norris, [place] 09/09/23 1935

## 2023-09-09 NOTE — ED Triage Notes (Signed)
 Pt reports right ear fullness x 1 day.

## 2023-09-22 ENCOUNTER — Ambulatory Visit
Admission: EM | Admit: 2023-09-22 | Discharge: 2023-09-22 | Disposition: A | Discharge status: Home / Self Care | Payer: Self-pay | Attending: Family Medicine | Admitting: Family Medicine

## 2023-09-22 ENCOUNTER — Ambulatory Visit (INDEPENDENT_AMBULATORY_CARE_PROVIDER_SITE_OTHER): Payer: Self-pay

## 2023-09-22 DIAGNOSIS — M79674 Pain in right toe(s): Secondary | ICD-10-CM

## 2023-09-22 NOTE — ED Triage Notes (Signed)
 Pt reports she stubbed her right pinky toe on the bathroom cabinet earlier today. States right pinky toe is painful. Fractured toe in the past. Hurt to walk

## 2023-09-22 NOTE — Discharge Instructions (Signed)
 Your x-ray today does not show any broken bones which is great news.  Ice, elevate, ibuprofen  and Tylenol  as needed, comfortable shoes.  Follow-up for worsening or unresolving symptoms.

## 2023-09-22 NOTE — ED Provider Notes (Signed)
 [facility]    CSN: [id number] Arrival date & time: 09/22/23  1322      History   Chief Complaint No chief complaint on file.   HPI [name] is a 43 y.o. female.   Patient presenting today with 1 day history of right toe after stubbing the toe.  Has some swelling, bruising to the area and pain with range of motion though range of motion appears intact.  Denies numbness, tingling, loss of range of motion, skin injury.  So far not try anything over-the-counter for symptoms.    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   Elevated triglycerides with high cholesterol 10/20/2019   Anemia 10/20/2019   Dyspareunia, female 10/18/2019   Encounter for gynecological examination with Papanicolaou smear of cervix 10/18/2019   Frequent headaches 10/18/2019   Tired 10/18/2019   Elevated BP without diagnosis of hypertension 10/18/2019   History of abnormal cervical Pap smear 10/18/2019   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   Gastroesophageal reflux disease without esophagitis 03/17/2016   Gastritis and gastroduodenitis 10/02/2013   Depression with anxiety 06/22/2013    Past Surgical History:  Procedure Laterality Date   BIOPSY  09/15/2022   Procedure: BIOPSY;  Surgeon: Cindie Carlin POUR, DO;  Location: [facility];  Service: Endoscopy;;   CESAREAN SECTION     CYSTOSCOPY W/ URETERAL STENT PLACEMENT Left 01/11/2020   Procedure: CYSTOSCOPY WITH RETROGRADE PYELOGRAM/URETERAL STENT PLACEMENT;  Surgeon: Sherrilee Belvie CROME, MD;  Location: [facility];  Service:  Urology;  Laterality: Left;   ectopic pregnancies     ESOPHAGOGASTRODUODENOSCOPY (EGD) WITH PROPOFOL  N/A 09/15/2022   Procedure: ESOPHAGOGASTRODUODENOSCOPY (EGD) WITH PROPOFOL ;  Surgeon: Cindie Carlin POUR, DO;  Location: [facility];  Service: Endoscopy;  Laterality: N/A;  1015am, asa 2   HOLMIUM LASER APPLICATION Left 01/11/2020   Procedure: HOLMIUM LASER APPLICATION-ureteroscopy;  Surgeon: Sherrilee Belvie CROME, MD;  Location: [facility];  Service: Urology;  Laterality: Left;   left fallopian tube removal     POLYPECTOMY  09/15/2022   Procedure: POLYPECTOMY;  Surgeon: Cindie Carlin POUR, DO;  [facility]: [facility];  Service: Endoscopy;;   right tube removed     history of ectopic pregnancies    OB History     Gravida  4   Para  1   Term  1   Preterm      AB  3   Living  1      SAB      IAB      Ectopic  3   Multiple      Live Births               Home Medications    Prior to Admission medications   Medication Sig Start Date End Date Taking? Authorizing Provider  acetaminophen  (TYLENOL ) 500 MG tablet Take 500-1,000 mg by mouth every 6 (six) hours as needed (pain.).    [provider]  dicyclomine  (BENTYL ) 20 MG tablet Take 1 tablet (20 mg total) by mouth 2 (two) times daily  as needed for spasms. 07/25/22   Kommor, Madison, MD  esomeprazole  (NEXIUM ) 40 MG capsule Take 1 capsule (40 mg total) by mouth 2 (two) times daily before a meal. 07/25/22 09/23/22  Kommor, Madison, MD  guaiFENesin  (ROBITUSSIN) 100 MG/5ML liquid Take 5 mLs by mouth every 4 (four) hours as needed for cough or to loosen phlegm. 03/01/23   Silver Wonda LABOR, PA  hydrochlorothiazide  (MICROZIDE ) 12.5 MG capsule Take 1 capsule (12.5 mg total) by mouth daily. 08/19/22   Signa Delon LABOR, NP  naphazoline-glycerin (CLEAR EYES REDNESS) 0.012-0.25 % SOLN Place 1-2 drops into both eyes 4 (four) times daily as needed for eye irritation.    [provider]  ondansetron  (ZOFRAN -ODT) 4 MG  disintegrating tablet Take 1 tablet (4 mg total) by mouth every 8 (eight) hours as needed for nausea or vomiting. 08/10/23   Chandra Harlene LABOR, NP    Family History Family History  Problem Relation Age of Onset   Hypertension Mother    Cancer Maternal Grandmother        colon cancer; 50s-60s   Cancer Maternal Grandfather        colon cancer; 65s-60s    Social History Social History   Tobacco Use   Smoking status: Never   Smokeless tobacco: Never  Vaping Use   Vaping status: Never Used  Substance Use Topics   Alcohol use: No   Drug use: Not Currently    Types: Benzodiazepines, Marijuana    Comment: opiates- Former     Allergies   Augmentin  [amoxicillin -pot clavulanate]   Review of Systems Review of Systems Per HPI  Physical Exam Triage Vital Signs ED Triage Vitals  Encounter Vitals Group     BP 09/22/23 1355 (!) 165/91     Girls Systolic BP Percentile --      Girls Diastolic BP Percentile --      Boys Systolic BP Percentile --      Boys Diastolic BP Percentile --      Pulse Rate 09/22/23 1355 86     Resp 09/22/23 1355 18     Temp 09/22/23 1355 98.4 F (36.9 C)     Temp Source 09/22/23 1355 Oral     SpO2 09/22/23 1355 98 %     Weight --      Height --      Head Circumference --      Peak Flow --      Pain Score 09/22/23 1358 5     Pain Loc --      Pain Education --      Exclude from Growth Chart --    No data found.  Updated Vital Signs BP (!) 165/91 (BP Location: Right Arm)   Pulse 86   Temp 98.4 F (36.9 C) (Oral)   Resp 18   SpO2 98%   Visual Acuity Right Eye Distance:   Left Eye Distance:   Bilateral Distance:    Right Eye Near:   Left Eye Near:    Bilateral Near:     Physical Exam Vitals and nursing note reviewed.  Constitutional:      Appearance: Normal appearance. She is not ill-appearing.  HENT:     Head: Atraumatic.  Eyes:     Extraocular Movements: Extraocular movements intact.     Conjunctiva/sclera: Conjunctivae  normal.  Cardiovascular:     Rate and Rhythm: Normal rate and regular rhythm.     Heart sounds: Normal heart sounds.  Pulmonary:     Effort: Pulmonary  effort is normal.     Breath sounds: Normal breath sounds.  Musculoskeletal:        General: Swelling, tenderness and signs of injury present. No deformity. Normal range of motion.     Cervical back: Normal range of motion and neck supple.  Skin:    General: Skin is warm and dry.     Findings: Bruising present.     Comments: Trace bruising, edema, tenderness to palpation to the right fifth toe  Neurological:     Mental Status: She is alert and oriented to person, place, and time.     Comments: Bilateral lower extremities neurovascularly intact  Psychiatric:        Mood and Affect: Mood normal.        Thought Content: Thought content normal.        Judgment: Judgment normal.      UC Treatments / Results  Labs (all labs ordered are listed, but only abnormal results are displayed) Labs Reviewed - No data to display  EKG   Radiology DG Foot Complete Right Result Date: 09/22/2023 CLINICAL DATA:  Trauma to the right foot. EXAM: RIGHT FOOT COMPLETE - 3+ VIEW COMPARISON:  Right foot radiograph dated 04/09/2020. FINDINGS: No acute fracture or dislocation. Old healed fracture of the proximal fifth metatarsal. The bones are osteopenic. The soft tissues are unremarkable. IMPRESSION: 1. No acute fracture or dislocation. 2. Old healed fracture of the proximal fifth metatarsal. Electronically Signed   By: Vanetta Chou M.D.   On: 09/22/2023 14:23    Procedures Procedures (including critical care time)  Medications Ordered in UC Medications - No data to display  Initial Impression / Assessment and Plan / UC Course  I have reviewed the triage vital signs and the nursing notes.  Pertinent labs & imaging results that were available during my care of the patient were reviewed by me and considered in my medical decision making (see chart  for details).     X-ray of the right foot negative for acute bony abnormality.  Discussed RICE protocol, over-the-counter pain relievers, supportive home care.  Return for worsening symptoms.  Final Clinical Impressions(s) / UC Diagnoses   Final diagnoses:  Toe pain, right     Discharge Instructions      Your x-ray today does not show any broken bones which is great news.  Ice, elevate, ibuprofen  and Tylenol  as needed, comfortable shoes.  Follow-up for worsening or unresolving symptoms.    ED Prescriptions   None    PDMP not reviewed this encounter.   Stuart Vernell Norris, [place] 09/22/23 1443

## 2023-09-29 ENCOUNTER — Encounter: Payer: Self-pay | Admitting: Adult Health

## 2023-09-29 ENCOUNTER — Other Ambulatory Visit: Payer: Self-pay | Admitting: Adult Health

## 2023-09-30 ENCOUNTER — Other Ambulatory Visit: Payer: Self-pay | Admitting: Adult Health

## 2023-09-30 MED ORDER — HYDROCHLOROTHIAZIDE 12.5 MG PO CAPS: 12.5000 mg | ORAL_CAPSULE | Freq: Every day | ORAL | 0 refills | Status: AC

## 2023-09-30 NOTE — Progress Notes (Signed)
Refilled microzide til appt

## 2023-10-15 ENCOUNTER — Ambulatory Visit
Admission: EM | Admit: 2023-10-15 | Discharge: 2023-10-15 | Disposition: A | Discharge status: Home / Self Care | Payer: Self-pay | Attending: Family Medicine | Admitting: Family Medicine

## 2023-10-15 DIAGNOSIS — R112 Nausea with vomiting, unspecified: Secondary | ICD-10-CM

## 2023-10-15 MED ORDER — ONDANSETRON 4 MG PO TBDP: 4.0000 mg | ORAL_TABLET | Freq: Three times a day (TID) | ORAL | 0 refills | Status: AC | PRN

## 2023-10-15 MED ORDER — ONDANSETRON 4 MG PO TBDP
4.0000 mg | ORAL_TABLET | Freq: Once | ORAL | Status: AC
  Administered 2023-10-15: 4 mg via ORAL

## 2023-10-15 NOTE — ED Provider Notes (Signed)
 [facility]    CSN: [id number] Arrival date & time: 10/15/23  1606      History   Chief Complaint No chief complaint on file.   HPI [name] is a 43 y.o. female.   Patient presenting today with 1 day history of nausea, vomiting.  She states the vomiting has now subsided but still feeling very nauseated.  Denies diarrhea, fever, chills, body aches, upper respiratory symptoms, new foods or medications, known sick contacts recently.  So far trying Pepto-Bismol and her typical GERD medications with minimal relief.    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   Elevated triglycerides with high cholesterol 10/20/2019   Anemia 10/20/2019   Dyspareunia, female 10/18/2019   Encounter for gynecological examination with Papanicolaou smear of cervix 10/18/2019   Frequent headaches 10/18/2019   Tired 10/18/2019   Elevated BP without diagnosis of hypertension 10/18/2019   History of abnormal cervical Pap smear 10/18/2019   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   Gastroesophageal reflux disease without esophagitis 03/17/2016   Gastritis and gastroduodenitis 10/02/2013   Depression with anxiety 06/22/2013    Past Surgical History:  Procedure Laterality Date   BIOPSY  09/15/2022   Procedure: BIOPSY;  Surgeon: Cindie Carlin POUR, DO;  Location: [facility];  Service: Endoscopy;;   CESAREAN SECTION     CYSTOSCOPY W/ URETERAL STENT PLACEMENT Left 01/11/2020   Procedure: CYSTOSCOPY WITH RETROGRADE PYELOGRAM/URETERAL STENT PLACEMENT;  Surgeon: Sherrilee Belvie CROME, MD;  Location: [facility];  Service: Urology;  Laterality: Left;   ectopic pregnancies     ESOPHAGOGASTRODUODENOSCOPY (EGD) WITH PROPOFOL  N/A 09/15/2022   Procedure: ESOPHAGOGASTRODUODENOSCOPY (EGD) WITH PROPOFOL ;  Surgeon: Cindie Carlin POUR, DO;  Location: [facility];  Service: Endoscopy;  Laterality: N/A;  1015am, asa 2   HOLMIUM LASER APPLICATION Left 01/11/2020   Procedure: HOLMIUM LASER APPLICATION-ureteroscopy;  Surgeon: Sherrilee Belvie CROME, MD;  Location: [facility];  Service: Urology;  Laterality: Left;   left fallopian tube removal     POLYPECTOMY  09/15/2022   Procedure: POLYPECTOMY;  Surgeon: Cindie Carlin POUR, DO;  [facility]: [facility];  Service: Endoscopy;;   right tube removed     history of ectopic pregnancies    OB History     Gravida  4   Para  1   Term  1   Preterm      AB  3   Living  1      SAB      IAB      Ectopic  3   Multiple      Live Births               Home Medications    Prior to Admission medications   Medication Sig Start Date End Date Taking? Authorizing Provider  ondansetron  (ZOFRAN -ODT) 4 MG disintegrating tablet Take 1 tablet (4 mg total) by mouth every 8 (eight) hours as needed for nausea or vomiting. 10/15/23  Yes Stuart Vernell Norris, PA-C  acetaminophen  (TYLENOL ) 500 MG tablet Take 500-1,000 mg by  mouth every 6 (six) hours as needed (pain.).    [provider]  dicyclomine  (BENTYL ) 20 MG tablet Take 1 tablet (20 mg total) by mouth 2 (two) times daily as needed for spasms. 07/25/22   Kommor, Madison, MD  esomeprazole  (NEXIUM ) 40 MG capsule Take 1 capsule (40 mg total) by mouth 2 (two) times daily before a meal. 07/25/22 09/23/22  Kommor, Madison, MD  guaiFENesin  (ROBITUSSIN) 100 MG/5ML liquid Take 5 mLs by mouth every 4 (four) hours as needed for cough or to loosen phlegm. 03/01/23   Silver Wonda LABOR, PA  hydrochlorothiazide  (MICROZIDE ) 12.5 MG capsule Take 1 capsule (12.5 mg total) by mouth daily. 09/30/23   Signa Delon LABOR, NP  naphazoline-glycerin (CLEAR EYES REDNESS) 0.012-0.25 % SOLN Place 1-2 drops into both eyes 4 (four) times daily as needed for eye irritation.    [provider]  ondansetron  (ZOFRAN -ODT) 4 MG disintegrating tablet Take 1 tablet (4 mg total) by mouth every 8 (eight) hours as needed for nausea or vomiting. 08/10/23   Chandra Harlene LABOR, NP    Family History Family History  Problem Relation Age of Onset   Hypertension Mother    Cancer Maternal Grandmother        colon cancer; 50s-60s   Cancer Maternal Grandfather        colon cancer; 34s-60s    Social History Social History   Tobacco Use   Smoking status: Never   Smokeless tobacco: Never  Vaping Use   Vaping status: Never Used  Substance Use Topics   Alcohol use: No   Drug use: Not Currently    Types: Benzodiazepines, Marijuana    Comment: opiates- Former     Allergies   Augmentin  [amoxicillin -pot clavulanate]   Review of Systems Review of Systems Per HPI  Physical Exam Triage Vital Signs ED Triage Vitals  Encounter Vitals Group     BP 10/15/23 1652 (!) 162/89     Girls Systolic BP Percentile --      Girls Diastolic BP Percentile --      Boys Systolic BP Percentile --      Boys Diastolic BP Percentile --      Pulse Rate 10/15/23 1652 87     Resp 10/15/23 1652 20     Temp 10/15/23 1652 98.4 F (36.9 C)     Temp Source 10/15/23 1652 Oral     SpO2 10/15/23 1652 98 %     Weight --      Height --      Head Circumference --      Peak Flow --      Pain Score 10/15/23 1654 0     Pain Loc --      Pain Education --      Exclude from Growth Chart --    No data found.  Updated Vital Signs BP (!) 162/89 (BP Location: Right Arm)   Pulse 87   Temp 98.4 F (36.9 C) (Oral)   Resp 20   LMP 10/11/2023 (Approximate)   SpO2 98%   Visual Acuity Right Eye Distance:   Left Eye Distance:   Bilateral Distance:    Right Eye Near:   Left Eye Near:    Bilateral Near:     Physical  Exam Vitals and nursing note reviewed.  Constitutional:      Appearance: Normal appearance. She is not ill-appearing.  HENT:     Head: Atraumatic.  Eyes:     Extraocular Movements: Extraocular movements intact.  Conjunctiva/sclera: Conjunctivae normal.  Cardiovascular:     Rate and Rhythm: Normal rate.  Pulmonary:     Effort: Pulmonary effort is normal.  Abdominal:     General: Bowel sounds are normal. There is no distension.     Palpations: Abdomen is soft.     Tenderness: There is no abdominal tenderness. There is no right CVA tenderness, left CVA tenderness or guarding.  Musculoskeletal:        General: Normal range of motion.     Cervical back: Normal range of motion and neck supple.  Skin:    General: Skin is warm and dry.  Neurological:     Mental Status: She is alert and oriented to person, place, and time.  Psychiatric:        Mood and Affect: Mood normal.        Thought Content: Thought content normal.        Judgment: Judgment normal.      UC Treatments / Results  Labs (all labs ordered are listed, but only abnormal results are displayed) Labs Reviewed - No data to display  EKG   Radiology No results found.  Procedures Procedures (including critical care time)  Medications Ordered in UC Medications  ondansetron  (ZOFRAN -ODT) disintegrating tablet 4 mg (has no administration in time range)    Initial Impression / Assessment and Plan / UC Course  I have reviewed the triage vital signs and the nursing notes.  Pertinent labs & imaging results that were available during my care of the patient were reviewed by me and considered in my medical decision making (see chart for details).     Vitals and exam overall reassuring today, no red flag findings.  Suspect viral GI illness.  Treat with Zofran , BRAT diet, fluids, rest.  Work note given.  Return for worsening symptoms.  Final Clinical Impressions(s) / UC Diagnoses   Final diagnoses:  Nausea and  vomiting, unspecified vomiting type   Discharge Instructions   None    ED Prescriptions     Medication Sig Dispense Auth. Provider   ondansetron  (ZOFRAN -ODT) 4 MG disintegrating tablet Take 1 tablet (4 mg total) by mouth every 8 (eight) hours as needed for nausea or vomiting. 20 tablet Stuart Vernell Norris, [place]      PDMP not reviewed this encounter.   Stuart Vernell Norris, [place] 10/15/23 1732

## 2023-10-15 NOTE — ED Triage Notes (Signed)
 Pt reports she has an upset stomach and  n/v x 1 day

## 2023-11-01 ENCOUNTER — Ambulatory Visit
Admission: EM | Admit: 2023-11-01 | Discharge: 2023-11-01 | Disposition: A | Discharge status: Home / Self Care | Payer: Self-pay

## 2023-11-02 ENCOUNTER — Ambulatory Visit: Payer: Self-pay

## 2023-11-30 ENCOUNTER — Encounter: Payer: Self-pay | Admitting: Adult Health

## 2023-12-08 ENCOUNTER — Ambulatory Visit
Admission: RE | Admit: 2023-12-08 | Discharge: 2023-12-08 | Disposition: A | Discharge status: Home / Self Care | Payer: Self-pay | Source: Ambulatory Visit | Attending: Nurse Practitioner | Admitting: Nurse Practitioner

## 2023-12-08 ENCOUNTER — Other Ambulatory Visit: Payer: Self-pay

## 2023-12-08 VITALS — BP 162/90 | HR 88 | Temp 98.2°F | Resp 16

## 2023-12-08 DIAGNOSIS — F411 Generalized anxiety disorder: Secondary | ICD-10-CM

## 2023-12-08 DIAGNOSIS — R110 Nausea: Secondary | ICD-10-CM

## 2023-12-08 DIAGNOSIS — R519 Headache, unspecified: Secondary | ICD-10-CM

## 2023-12-08 DIAGNOSIS — R002 Palpitations: Secondary | ICD-10-CM

## 2023-12-08 DIAGNOSIS — I10 Essential (primary) hypertension: Secondary | ICD-10-CM

## 2023-12-08 LAB — GLUCOSE, POCT (MANUAL RESULT ENTRY): POC Glucose: 109 mg/dL — AB (ref 70–99)

## 2023-12-08 MED ORDER — HYDROXYZINE HCL 10 MG PO TABS: 10.0000 mg | ORAL_TABLET | Freq: Three times a day (TID) | ORAL | 0 refills | Status: AC | PRN

## 2023-12-08 NOTE — ED Provider Notes (Signed)
 [facility]    CSN: [id number] Arrival date & time: 12/08/23  1041      History   Chief Complaint Chief Complaint  Patient presents with   Anxiety    Headaches, nausea,  heart beating fast, skip beat sometimes - Entered by patient    HPI [name] is a 43 y.o. female.   Patient presents today with I think my blood pressure is elevated, anxiety, my heart skips a beat, nausea, and headache ongoing for the past 3 days and worsening.  She does report history of both anxiety and hypertension.  She is prescribed hydrochlorothiazide  12.5 mg daily and takes it sporadically, has not taken it today.  She says her headache is currently a 7/10 and describes the headache as a pounding on the right side of her temple.  She checks blood pressure at home sporadically as well.  No current chest pain, shortness of breath, lightheadedness, or dizziness.  No vision changes or worst headache of life.  OB/GYN is currently managing blood pressure and she does not have a PCP.  Also endorses anxiety, has been prescribed hydroxyzine in the past and it works okay for her.  Denies SI/HI.      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   Elevated triglycerides with high cholesterol 10/20/2019   Anemia 10/20/2019   Dyspareunia, female 10/18/2019   Encounter for gynecological examination with Papanicolaou smear of cervix 10/18/2019   Frequent headaches 10/18/2019   Tired 10/18/2019   Elevated BP without diagnosis of hypertension 10/18/2019   History of abnormal cervical Pap  smear 10/18/2019   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   Gastroesophageal reflux disease without esophagitis 03/17/2016   Gastritis and gastroduodenitis 10/02/2013   Depression with anxiety 06/22/2013    Past Surgical History:  Procedure Laterality Date   BIOPSY  09/15/2022   Procedure: BIOPSY;  Surgeon: Cindie Carlin POUR, DO;  Location: [facility];  Service: Endoscopy;;   CESAREAN SECTION     CYSTOSCOPY W/ URETERAL STENT PLACEMENT Left 01/11/2020   Procedure: CYSTOSCOPY WITH RETROGRADE PYELOGRAM/URETERAL STENT PLACEMENT;  Surgeon: Sherrilee Belvie CROME, MD;  Location: [facility];  Service: Urology;  Laterality: Left;   ectopic pregnancies     ESOPHAGOGASTRODUODENOSCOPY (EGD) WITH PROPOFOL  N/A 09/15/2022   Procedure: ESOPHAGOGASTRODUODENOSCOPY (EGD) WITH PROPOFOL ;  Surgeon: Cindie Carlin POUR, DO;  Location: [facility];  Service: Endoscopy;  Laterality: N/A;  1015am, asa 2   HOLMIUM LASER APPLICATION Left 01/11/2020   Procedure: HOLMIUM LASER APPLICATION-ureteroscopy;  Surgeon: Sherrilee Belvie CROME, MD;  Location: [facility];  Service: Urology;  Laterality: Left;   left fallopian tube removal     POLYPECTOMY  09/15/2022   Procedure: POLYPECTOMY;  Surgeon: Cindie Carlin POUR, DO;  Location: [facility];  Service: Endoscopy;;   right tube removed     history of ectopic pregnancies    OB History     Gravida  4   Para  1   Term  1   Preterm      AB  3  Living  1      SAB      IAB      Ectopic  3   Multiple      Live Births               Home Medications    Prior to Admission medications   Medication Sig Start Date End Date Taking? Authorizing Provider  hydrOXYzine (ATARAX) 10 MG tablet Take 1 tablet (10 mg total) by mouth 3 (three) times daily as needed. Do not take with alcohol or while driving or operating heavy machinery.  May cause drowsiness. 12/08/23  Yes Chandra Harlene LABOR, NP  acetaminophen  (TYLENOL ) 500 MG tablet Take 500-1,000 mg by mouth every 6 (six)  hours as needed (pain.).    [provider]  dicyclomine  (BENTYL ) 20 MG tablet Take 1 tablet (20 mg total) by mouth 2 (two) times daily as needed for spasms. 07/25/22   Kommor, Madison, MD  esomeprazole  (NEXIUM ) 40 MG capsule Take 1 capsule (40 mg total) by mouth 2 (two) times daily before a meal. 07/25/22 09/23/22  Kommor, Madison, MD  guaiFENesin  (ROBITUSSIN) 100 MG/5ML liquid Take 5 mLs by mouth every 4 (four) hours as needed for cough or to loosen phlegm. 03/01/23   Silver Wonda LABOR, PA  hydrochlorothiazide  (MICROZIDE ) 12.5 MG capsule Take 1 capsule (12.5 mg total) by mouth daily. 09/30/23   Signa Delon LABOR, NP  naphazoline-glycerin (CLEAR EYES REDNESS) 0.012-0.25 % SOLN Place 1-2 drops into both eyes 4 (four) times daily as needed for eye irritation.    [provider]  ondansetron  (ZOFRAN -ODT) 4 MG disintegrating tablet Take 1 tablet (4 mg total) by mouth every 8 (eight) hours as needed for nausea or vomiting. 08/10/23   Chandra Harlene LABOR, NP  ondansetron  (ZOFRAN -ODT) 4 MG disintegrating tablet Take 1 tablet (4 mg total) by mouth every 8 (eight) hours as needed for nausea or vomiting. 10/15/23   Stuart Vernell Norris, PA-C    Family History Family History  Problem Relation Age of Onset   Hypertension Mother    Cancer Maternal Grandmother        colon cancer; 50s-60s   Cancer Maternal Grandfather        colon cancer; 78s-60s    Social History Social History   Tobacco Use   Smoking status: Never   Smokeless tobacco: Never  Vaping Use   Vaping status: Never Used  Substance Use Topics   Alcohol use: No   Drug use: Not Currently    Types: Benzodiazepines, Marijuana    Comment: opiates- Former     Allergies   Augmentin  [amoxicillin -pot clavulanate]   Review of Systems Review of Systems Per HPI  Physical Exam Triage Vital Signs ED Triage Vitals  Encounter Vitals Group     BP 12/08/23 1056 (!) 162/90     Girls Systolic BP Percentile --      Girls  Diastolic BP Percentile --      Boys Systolic BP Percentile --      Boys Diastolic BP Percentile --      Pulse Rate 12/08/23 1056 88     Resp 12/08/23 1056 16     Temp 12/08/23 1056 98.2 F (36.8 C)     Temp Source 12/08/23 1056 Oral     SpO2 12/08/23 1056 99 %     Weight --      Height --      Head Circumference --      Peak Flow --  Pain Score 12/08/23 1104 0     Pain Loc --      Pain Education --      Exclude from Growth Chart --    Orthostatic VS for the past 24 hrs:  BP- Lying Pulse- Lying BP- Sitting Pulse- Sitting BP- Standing at 0 minutes Pulse- Standing at 0 minutes  12/08/23 1150 148/82 78 142/81 78 144/82 80    Updated Vital Signs BP (!) 162/90 (BP Location: Right Arm)   Pulse 88   Temp 98.2 F (36.8 C) (Oral)   Resp 16   LMP 12/08/2023 (Exact Date)   SpO2 99%   Visual Acuity Right Eye Distance:   Left Eye Distance:   Bilateral Distance:    Right Eye Near:   Left Eye Near:    Bilateral Near:     Physical Exam Vitals and nursing note reviewed.  Constitutional:      General: She is not in acute distress.    Appearance: Normal appearance. She is not toxic-appearing.  HENT:     Head: Normocephalic and atraumatic.     Right Ear: External ear normal.     Left Ear: External ear normal.     Mouth/Throat:     Mouth: Mucous membranes are moist.     Pharynx: Oropharynx is clear.  Cardiovascular:     Rate and Rhythm: Normal rate and regular rhythm.  Pulmonary:     Effort: Pulmonary effort is normal. No respiratory distress.     Breath sounds: Normal breath sounds. No wheezing, rhonchi or rales.  Musculoskeletal:     Cervical back: Normal range of motion.  Lymphadenopathy:     Cervical: No cervical adenopathy.  Skin:    General: Skin is warm and dry.     Coloration: Skin is not jaundiced or pale.     Findings: No erythema.  Neurological:     Mental Status: She is alert and oriented to person, place, and time.  Psychiatric:        Behavior:  Behavior is cooperative.      UC Treatments / Results  Labs (all labs ordered are listed, but only abnormal results are displayed) Labs Reviewed  GLUCOSE, POCT (MANUAL RESULT ENTRY) - Abnormal; Notable for the following components:      Result Value   POC Glucose 109 (*)    All other components within normal limits  BASIC METABOLIC PANEL WITH GFR  TSH    EKG   Radiology No results found.  Procedures Procedures (including critical care time)  Medications Ordered in UC Medications - No data to display  Initial Impression / Assessment and Plan / UC Course  I have reviewed the triage vital signs and the nursing notes.  Pertinent labs & imaging results that were available during my care of the patient were reviewed by me and considered in my medical decision making (see chart for details).   Patient is mildly hypertensive on recheck today in urgent care, otherwise vital signs are stable.  1. Palpitations 2. Anxiety state 3. Nausea without vomiting 4. Acute nonintractable headache, unspecified headache type 5. Essential hypertension Blood pressure is elevated in urgent care today initially, however improved on recheck Suspect headache is attributed to elevated blood pressure and patient not consistently taking hydrochlorothiazide  as prescribed I recommended she take hydrochlorothiazide  as prescribed and check blood pressure regularly to evaluate for effectiveness; if blood pressure remains elevated above 130/80, she is to follow-up with primary care provider and discuss with them Blood work obtained  today to evaluate electrolytes, kidney function, and thyroid level EKG today shows normal sinus rhythm with incomplete right bundle branch block; no significant ST segment or T wave changes when compared with previous EKG Random blood sugar today is not significantly elevated Orthostatic vital signs are negative Suspect anxiety, palpitations, intermittent nausea may be related to  panic disorder Start hydroxyzine 10 mg every 8 hours as needed and follow-up with primary care provider Strict ER precautions discussed with patient   The patient was given the opportunity to ask questions.  All questions answered to their satisfaction.  The patient is in agreement to this plan.   Final Clinical Impressions(s) / UC Diagnoses   Final diagnoses:  Palpitations  Anxiety state  Nausea without vomiting  Acute nonintractable headache, unspecified headache type  Essential hypertension     Discharge Instructions      EKG and vital signs today are stable.  Blood sugar is also stable.  We are checking blood work today and will contact you with abnormal results tomorrow.   In the meantime, recommend taking your blood pressure medicine consistently.  Additionally, check your blood pressure at home and follow up with a PCP if >130/80 consistently.    You can take the hydroxyzine 10 mg every 8 hours as needed to help with anxiety or stress.  Do not take before driving or operating heavy machinery if this makes you sleepy.    Please go to [email or web address] to find a PCP and schedule an appointment for follow up of your hypertenson and anxiety.     ED Prescriptions     Medication Sig Dispense Auth. Provider   hydrOXYzine (ATARAX) 10 MG tablet Take 1 tablet (10 mg total) by mouth 3 (three) times daily as needed. Do not take with alcohol or while driving or operating heavy machinery.  May cause drowsiness. 30 tablet Chandra Harlene LABOR, NP      PDMP not reviewed this encounter.   Chandra Harlene LABOR, NP 12/08/23 1324

## 2023-12-08 NOTE — Discharge Instructions (Signed)
 EKG and vital signs today are stable.  Blood sugar is also stable.  We are checking blood work today and will contact you with abnormal results tomorrow.   In the meantime, recommend taking your blood pressure medicine consistently.  Additionally, check your blood pressure at home and follow up with a PCP if >130/80 consistently.    You can take the hydroxyzine 10 mg every 8 hours as needed to help with anxiety or stress.  Do not take before driving or operating heavy machinery if this makes you sleepy.    Please go to [email or web address] to find a PCP and schedule an appointment for follow up of your hypertenson and anxiety.

## 2023-12-08 NOTE — ED Triage Notes (Signed)
 Pt reports headache elevated BP, feels like heart is beating fast nausea intermittently x 3 days but has gotten worse over night. Hx of hypertension, takes HCTZ

## 2023-12-09 LAB — BASIC METABOLIC PANEL WITH GFR
BUN/Creatinine Ratio: 12 (ref 9–23)
BUN: 8 mg/dL (ref 6–24)
CO2: 21 mmol/L (ref 20–29)
Calcium: 9 mg/dL (ref 8.7–10.2)
Chloride: 104 mmol/L (ref 96–106)
Creatinine, Ser: 0.65 mg/dL (ref 0.57–1.00)
Glucose: 83 mg/dL (ref 70–99)
Potassium: 4.2 mmol/L (ref 3.5–5.2)
Sodium: 141 mmol/L (ref 134–144)
eGFR: 112 mL/min/1.73 (ref >59)

## 2023-12-09 LAB — TSH: TSH: 0.896 u[IU]/mL (ref 0.450–4.500)

## 2023-12-10 ENCOUNTER — Ambulatory Visit (HOSPITAL_COMMUNITY): Payer: Self-pay

## 2024-01-30 ENCOUNTER — Inpatient Hospital Stay: Admission: RE | Admit: 2024-01-30 | Discharge: 2024-01-30 | Payer: Self-pay | Attending: Family Medicine

## 2024-01-30 VITALS — BP 141/84 | HR 78 | Temp 98.3°F | Resp 18

## 2024-01-30 DIAGNOSIS — R112 Nausea with vomiting, unspecified: Secondary | ICD-10-CM

## 2024-01-30 MED ORDER — ONDANSETRON 4 MG PO TBDP: 4.0000 mg | ORAL_TABLET | Freq: Three times a day (TID) | ORAL | 0 refills | Status: AC | PRN

## 2024-01-30 MED ORDER — ONDANSETRON 4 MG PO TBDP
4.0000 mg | ORAL_TABLET | Freq: Once | ORAL | Status: AC
  Administered 2024-01-30: 4 mg via ORAL

## 2024-01-30 NOTE — ED Provider Notes (Signed)
 [facility]    CSN: [id number] Arrival date & time: 01/30/24  1058      History   Chief Complaint Chief Complaint  Patient presents with   Nausea    Vomiting, headache - Entered by patient    HPI [name] is a 43 y.o. female.   Patient presenting today with 2-day history of nausea, vomiting, fatigue.  States last episode of vomiting was about 24 hours ago but still feeling a bit nauseated.  Denies diarrhea, constipation, fever, chills, body aches, upper respiratory symptoms, urinary symptoms.  So far trying Pepto-Bismol and Nexium  that she takes for GERD with minimal relief.  No new foods or medications, no recent travel outside the country, no known sick contacts recently.    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   Elevated triglycerides with high cholesterol 10/20/2019   Anemia 10/20/2019   Dyspareunia, female 10/18/2019   Encounter for gynecological examination with Papanicolaou smear of cervix 10/18/2019   Frequent headaches 10/18/2019   Tired 10/18/2019   Elevated BP without diagnosis of hypertension 10/18/2019   History of abnormal cervical Pap smear 10/18/2019   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   Gastroesophageal reflux disease without esophagitis 03/17/2016   Gastritis and gastroduodenitis 10/02/2013   Depression with anxiety 06/22/2013    Past Surgical History:  Procedure Laterality Date   BIOPSY  09/15/2022   Procedure: BIOPSY;  Surgeon: Cindie Carlin POUR, DO;  Location: [facility];  Service: Endoscopy;;   CESAREAN  SECTION     CYSTOSCOPY W/ URETERAL STENT PLACEMENT Left 01/11/2020   Procedure: CYSTOSCOPY WITH RETROGRADE PYELOGRAM/URETERAL STENT PLACEMENT;  Surgeon: Sherrilee Belvie CROME, MD;  Location: [facility];  Service: Urology;  Laterality: Left;   ectopic pregnancies     ESOPHAGOGASTRODUODENOSCOPY (EGD) WITH PROPOFOL  N/A 09/15/2022   Procedure: ESOPHAGOGASTRODUODENOSCOPY (EGD) WITH PROPOFOL ;  Surgeon: Cindie Carlin POUR, DO;  Location: [facility];  Service: Endoscopy;  Laterality: N/A;  1015am, asa 2   HOLMIUM LASER APPLICATION Left 01/11/2020   Procedure: HOLMIUM LASER APPLICATION-ureteroscopy;  Surgeon: Sherrilee Belvie CROME, MD;  Location: [facility];  Service: Urology;  Laterality: Left;   left fallopian tube removal     POLYPECTOMY  09/15/2022   Procedure: POLYPECTOMY;  Surgeon: Cindie Carlin POUR, DO;  Location: [facility];  Service: Endoscopy;;   right tube removed     history of ectopic pregnancies    OB History     Gravida  4   Para  1   Term  1   Preterm      AB  3   Living  1      SAB      IAB      Ectopic  3   Multiple      Live Births               Home Medications    Prior to Admission medications   Medication Sig Start Date End Date Taking? Authorizing Provider  ondansetron  (ZOFRAN -ODT) 4 MG disintegrating tablet Take 1 tablet (  4 mg total) by mouth every 8 (eight) hours as needed for nausea or vomiting. 01/30/24  Yes Stuart Vernell Norris, PA-C  acetaminophen  (TYLENOL ) 500 MG tablet Take 500-1,000 mg by mouth every 6 (six) hours as needed (pain.).    [provider]  dicyclomine  (BENTYL ) 20 MG tablet Take 1 tablet (20 mg total) by mouth 2 (two) times daily as needed for spasms. 07/25/22   Kommor, Madison, MD  esomeprazole  (NEXIUM ) 40 MG capsule Take 1 capsule (40 mg total) by mouth 2 (two) times daily before a meal. 07/25/22 09/23/22  Kommor, Madison, MD  hydrochlorothiazide  (MICROZIDE ) 12.5 MG capsule Take 1 capsule (12.5 mg total) by mouth daily. 09/30/23    Signa Delon LABOR, NP  hydrOXYzine  (ATARAX ) 10 MG tablet Take 1 tablet (10 mg total) by mouth 3 (three) times daily as needed. Do not take with alcohol or while driving or operating heavy machinery.  May cause drowsiness. 12/08/23   Chandra Harlene LABOR, NP  naphazoline-glycerin (CLEAR EYES REDNESS) 0.012-0.25 % SOLN Place 1-2 drops into both eyes 4 (four) times daily as needed for eye irritation.    [provider]  ondansetron  (ZOFRAN -ODT) 4 MG disintegrating tablet Take 1 tablet (4 mg total) by mouth every 8 (eight) hours as needed for nausea or vomiting. 10/15/23   Stuart Vernell Norris, PA-C    Family History Family History  Problem Relation Age of Onset   Hypertension Mother    Cancer Maternal Grandmother        colon cancer; 50s-60s   Cancer Maternal Grandfather        colon cancer; 3s-60s    Social History Social History   Tobacco Use   Smoking status: Never   Smokeless tobacco: Never  Vaping Use   Vaping status: Never Used  Substance Use Topics   Alcohol use: No   Drug use: Not Currently    Types: Benzodiazepines, Marijuana    Comment: opiates- Former     Allergies   Augmentin  [amoxicillin -pot clavulanate]   Review of Systems Review of Systems Per HPI  Physical Exam Triage Vital Signs ED Triage Vitals  Encounter Vitals Group     BP 01/30/24 1103 (!) 141/84     Girls Systolic BP Percentile --      Girls Diastolic BP Percentile --      Boys Systolic BP Percentile --      Boys Diastolic BP Percentile --      Pulse Rate 01/30/24 1103 78     Resp 01/30/24 1103 18     Temp 01/30/24 1103 98.3 F (36.8 C)     Temp Source 01/30/24 1103 Oral     SpO2 01/30/24 1103 99 %     Weight --      Height --      Head Circumference --      Peak Flow --      Pain Score 01/30/24 1106 0     Pain Loc --      Pain Education --      Exclude from Growth Chart --    No data found.  Updated Vital Signs BP (!) 141/84 (BP Location: Right Arm)   Pulse 78   Temp  98.3 F (36.8 C) (Oral)   Resp 18   LMP 01/28/2024 (Exact Date)   SpO2 99%   Visual Acuity Right Eye Distance:   Left Eye Distance:   Bilateral Distance:    Right Eye Near:   Left Eye Near:    Bilateral  Near:     Physical Exam Vitals and nursing note reviewed.  Constitutional:      Appearance: Normal appearance. She is not ill-appearing.  HENT:     Head: Atraumatic.  Eyes:     Extraocular Movements: Extraocular movements intact.     Conjunctiva/sclera: Conjunctivae normal.  Cardiovascular:     Rate and Rhythm: Normal rate.  Pulmonary:     Effort: Pulmonary effort is normal.  Abdominal:     General: Bowel sounds are normal. There is no distension.     Palpations: Abdomen is soft.     Tenderness: There is no abdominal tenderness. There is no right CVA tenderness, left CVA tenderness or guarding.  Musculoskeletal:        General: Normal range of motion.     Cervical back: Normal range of motion and neck supple.  Skin:    General: Skin is warm and dry.  Neurological:     Mental Status: She is alert and oriented to person, place, and time.  Psychiatric:        Mood and Affect: Mood normal.        Thought Content: Thought content normal.        Judgment: Judgment normal.      UC Treatments / Results  Labs (all labs ordered are listed, but only abnormal results are displayed) Labs Reviewed - No data to display  EKG   Radiology No results found.  Procedures Procedures (including critical care time)  Medications Ordered in UC Medications  ondansetron  (ZOFRAN -ODT) disintegrating tablet 4 mg (4 mg Oral Given 01/30/24 1141)    Initial Impression / Assessment and Plan / UC Course  I have reviewed the triage vital signs and the nursing notes.  Pertinent labs & imaging results that were available during my care of the patient were reviewed by me and considered in my medical decision making (see chart for details).     Vitals and exam reassuring today, no red  flag findings.  Suspect viral GI illness.  Treat with Zofran , BRAT diet, fluids, rest.  Work note given.  Final Clinical Impressions(s) / UC Diagnoses   Final diagnoses:  Nausea and vomiting, unspecified vomiting type   Discharge Instructions   None    ED Prescriptions     Medication Sig Dispense Auth. Provider   ondansetron  (ZOFRAN -ODT) 4 MG disintegrating tablet Take 1 tablet (4 mg total) by mouth every 8 (eight) hours as needed for nausea or vomiting. 20 tablet Stuart Vernell Norris, [place]      PDMP not reviewed this encounter.   Stuart Vernell Norris, [place] 01/30/24 1153

## 2024-01-30 NOTE — ED Triage Notes (Signed)
 Vomiting that started on Friday and fatigue.  States feels weak.  States feels pain under left breast that has been going on for many years and states pain has been flared up since Friday

## 2024-02-29 ENCOUNTER — Ambulatory Visit
Admission: EM | Admit: 2024-02-29 | Discharge: 2024-02-29 | Disposition: A | Discharge status: Home / Self Care | Payer: Self-pay | Attending: Nurse Practitioner | Admitting: Nurse Practitioner

## 2024-02-29 DIAGNOSIS — R110 Nausea: Secondary | ICD-10-CM

## 2024-02-29 LAB — POCT INFLUENZA A/B
Influenza A, POC: NEGATIVE
Influenza B, POC: NEGATIVE

## 2024-02-29 NOTE — Discharge Instructions (Signed)
 You were seen today for nausea.  Continue to hydrate with plenty of liquids including water  and electrolyte solutions at home.  You can continue Zofran  every 8 hours as needed.  Seek care if symptoms worsen despite treatment.

## 2024-02-29 NOTE — ED Provider Notes (Signed)
 " [facility]    CSN: [id number] Arrival date & time: 02/29/24  1045      History   Chief Complaint No chief complaint on file.   HPI [name] is a 44 y.o. female.     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   Elevated triglycerides with high cholesterol 10/20/2019   Anemia 10/20/2019   Dyspareunia, female 10/18/2019   Encounter for gynecological examination with Papanicolaou smear of cervix 10/18/2019   Frequent headaches 10/18/2019   Tired 10/18/2019   Elevated BP without diagnosis of hypertension 10/18/2019   History of abnormal cervical Pap smear 10/18/2019   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   Gastroesophageal reflux disease without esophagitis 03/17/2016   Gastritis and gastroduodenitis 10/02/2013   Depression with anxiety 06/22/2013    Past Surgical History:  Procedure Laterality Date   BIOPSY  09/15/2022   Procedure: BIOPSY;  Surgeon: Cindie Carlin POUR, DO;  Location: [facility];  Service: Endoscopy;;   CESAREAN SECTION     CYSTOSCOPY W/ URETERAL STENT PLACEMENT Left 01/11/2020   Procedure: CYSTOSCOPY WITH RETROGRADE PYELOGRAM/URETERAL STENT PLACEMENT;  Surgeon: Sherrilee Belvie CROME, MD;  Location: [facility];  Service: Urology;  Laterality: Left;   ectopic pregnancies     ESOPHAGOGASTRODUODENOSCOPY (EGD) WITH PROPOFOL  N/A 09/15/2022   Procedure:  ESOPHAGOGASTRODUODENOSCOPY (EGD) WITH PROPOFOL ;  Surgeon: Cindie Carlin POUR, DO;  Location: [facility];  Service: Endoscopy;  Laterality: N/A;  1015am, asa 2   HOLMIUM LASER APPLICATION Left 01/11/2020   Procedure: HOLMIUM LASER APPLICATION-ureteroscopy;  Surgeon: Sherrilee Belvie CROME, MD;  Location: [facility];  Service: Urology;  Laterality: Left;   left fallopian tube removal     POLYPECTOMY  09/15/2022   Procedure: POLYPECTOMY;  Surgeon: Cindie Carlin POUR, DO;  Location: [facility];  Service: Endoscopy;;   right tube removed     history of ectopic pregnancies    OB History     Gravida  4   Para  1   Term  1   Preterm      AB  3   Living  1      SAB      IAB      Ectopic  3   Multiple      Live Births               Home Medications    Prior to Admission medications  Medication Sig Start Date End Date Taking? Authorizing Provider  acetaminophen  (TYLENOL ) 500 MG tablet Take 500-1,000 mg by mouth every 6 (six) hours as needed (pain.).    [provider]  dicyclomine  (BENTYL ) 20 MG tablet Take 1 tablet (20 mg total) by mouth 2 (two) times daily as needed for spasms. 07/25/22   Kommor, Madison, MD  esomeprazole  (NEXIUM ) 40 MG capsule Take 1 capsule (40 mg total) by mouth  2 (two) times daily before a meal. 07/25/22 09/23/22  Kommor, Madison, MD  hydrochlorothiazide  (MICROZIDE ) 12.5 MG capsule Take 1 capsule (12.5 mg total) by mouth daily. 09/30/23   Signa Delon LABOR, NP  hydrOXYzine  (ATARAX ) 10 MG tablet Take 1 tablet (10 mg total) by mouth 3 (three) times daily as needed. Do not take with alcohol or while driving or operating heavy machinery.  May cause drowsiness. 12/08/23   Chandra Harlene LABOR, NP  naphazoline-glycerin (CLEAR EYES REDNESS) 0.012-0.25 % SOLN Place 1-2 drops into both eyes 4 (four) times daily as needed for eye irritation.    [provider]  ondansetron  (ZOFRAN -ODT) 4 MG disintegrating tablet Take 1 tablet (4 mg total) by mouth  every 8 (eight) hours as needed for nausea or vomiting. 10/15/23   Stuart Vernell Norris, PA-C  ondansetron  (ZOFRAN -ODT) 4 MG disintegrating tablet Take 1 tablet (4 mg total) by mouth every 8 (eight) hours as needed for nausea or vomiting. 01/30/24   Stuart Vernell Norris, PA-C    Family History Family History  Problem Relation Age of Onset   Hypertension Mother    Cancer Maternal Grandmother        colon cancer; 50s-60s   Cancer Maternal Grandfather        colon cancer; 24s-60s    Social History Social History[1]   Allergies   Augmentin  [amoxicillin -pot clavulanate]   Review of Systems Review of Systems   Physical Exam Triage Vital Signs ED Triage Vitals  Encounter Vitals Group     BP 02/29/24 1114 (!) 148/86     Girls Systolic BP Percentile --      Girls Diastolic BP Percentile --      Boys Systolic BP Percentile --      Boys Diastolic BP Percentile --      Pulse Rate 02/29/24 1114 91     Resp 02/29/24 1114 20     Temp 02/29/24 1114 98.3 F (36.8 C)     Temp Source 02/29/24 1114 Oral     SpO2 02/29/24 1114 97 %     Weight --      Height --      Head Circumference --      Peak Flow --      Pain Score 02/29/24 1115 0     Pain Loc --      Pain Education --      Exclude from Growth Chart --    No data found.  Updated Vital Signs BP (!) 148/86 (BP Location: Right Arm)   Pulse 91   Temp 98.3 F (36.8 C) (Oral)   Resp 20   LMP 02/25/2024 (Exact Date)   SpO2 97%   Visual Acuity Right Eye Distance:   Left Eye Distance:   Bilateral Distance:    Right Eye Near:   Left Eye Near:    Bilateral Near:     Physical Exam   UC Treatments / Results  Labs (all labs ordered are listed, but only abnormal results are displayed) Labs Reviewed  POCT INFLUENZA A/B    EKG   Radiology No results found.  Procedures Procedures (including critical care time)  Medications Ordered in UC Medications - No data to display  Initial Impression / Assessment  and Plan / UC Course  I have reviewed the triage vital signs and the nursing notes.  Pertinent labs & imaging results that were available during my care of the patient were reviewed by me and considered in my medical decision making (see  chart for details).     *** Final Clinical Impressions(s) / UC Diagnoses   Final diagnoses:  None   Discharge Instructions   None    ED Prescriptions   None    PDMP not reviewed this encounter.       [1] Social History Tobacco Use   Smoking status: Never   Smokeless tobacco: Never  Vaping Use   Vaping status: Never Used  Substance Use Topics   Alcohol use: No   Drug use: Not Currently    Types: Benzodiazepines, Marijuana    Comment: opiates- Former  "

## 2024-02-29 NOTE — ED Triage Notes (Signed)
 Pt reports nausea, no appetite, and headache x 1 day

## 2024-03-03 ENCOUNTER — Ambulatory Visit
Admission: EM | Admit: 2024-03-03 | Discharge: 2024-03-03 | Disposition: A | Discharge status: Home / Self Care | Payer: Self-pay | Attending: Physician Assistant | Admitting: Physician Assistant

## 2024-03-03 ENCOUNTER — Encounter: Payer: Self-pay | Admitting: Emergency Medicine

## 2024-03-03 DIAGNOSIS — G43009 Migraine without aura, not intractable, without status migrainosus: Secondary | ICD-10-CM

## 2024-03-03 MED ORDER — BACLOFEN 5 MG PO TABS: 5.0000 mg | ORAL_TABLET | Freq: Every evening | ORAL | 0 refills | Status: AC | PRN

## 2024-03-03 MED ORDER — KETOROLAC TROMETHAMINE 30 MG/ML IJ SOLN
30.0000 mg | Freq: Once | INTRAMUSCULAR | Status: AC
  Administered 2024-03-03: 30 mg via INTRAMUSCULAR

## 2024-03-03 NOTE — Discharge Instructions (Signed)
 I did not see any evidence of an infection such as a sinus infection that would warrant antibiotics.  I suspect your headache is related to a migraine.  We gave an injection of Toradol  today so please do not take NSAIDs for the next 24 hours (aspirin , ibuprofen /Advil , naproxen /Aleve ).  You can use Tylenol /acetaminophen  for breakthrough pain.  Take baclofen  at night.  This will make you sleepy so do not drive or drink alcohol while taking it.  Make sure that you rest and drink plenty of fluid.  If your headache is not improving with the medication or if anything worsens you have the worst headache of your life, associated vision changes, weakness of 1 part of your body, trouble speaking, fever, weakness, nausea/vomiting interfering with eating and drinking you need to be seen immediately.

## 2024-03-03 NOTE — ED Provider Notes (Signed)
 " [facility]    CSN: [id number] Arrival date & time: 03/03/24  1039      History   Chief Complaint No chief complaint on file.   HPI [name] is a 44 y.o. female.   Patient presents today with a 1 day history of right sided headache.  She reports that headache pain is rated 6/7 on a 0-10 pain scale, described as throbbing, worse with activity, improved with rest.  She reports associated photophobia and phonophobia.  She was experiencing nausea and vomiting and was seen for this on 02/29/2024 but has been seeing Zofran  and the symptoms have resolved.  She denies any fever, significant cough, shortness of breath.  She does have some mild congestion and is unsure if she might have a sinus infection as the pain is localized to the area behind her right eye.  She does have a history of recurrent headaches and migraines with similar presentation.  She has tried Tylenol  without improvement.  Denies any recent head injury.  This is not the worst headache of her life.  She denies any associated dysarthria, visual disturbance, focal weakness.  She is confident that she is not pregnant.    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   Elevated triglycerides with high cholesterol 10/20/2019   Anemia 10/20/2019   Dyspareunia, female 10/18/2019   Encounter for gynecological examination with Papanicolaou smear of cervix 10/18/2019   Frequent headaches 10/18/2019   Tired 10/18/2019   Elevated BP without diagnosis of hypertension 10/18/2019   History of abnormal cervical Pap  smear 10/18/2019   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   Gastroesophageal reflux disease without esophagitis 03/17/2016   Gastritis and gastroduodenitis 10/02/2013   Depression with anxiety 06/22/2013    Past Surgical History:  Procedure Laterality Date   BIOPSY  09/15/2022   Procedure: BIOPSY;  Surgeon: Cindie Carlin POUR, DO;  Location: [facility];  Service: Endoscopy;;   CESAREAN SECTION     CYSTOSCOPY W/ URETERAL STENT PLACEMENT Left 01/11/2020   Procedure: CYSTOSCOPY WITH RETROGRADE PYELOGRAM/URETERAL STENT PLACEMENT;  Surgeon: Sherrilee Belvie CROME, MD;  Location: [facility];  Service: Urology;  Laterality: Left;   ectopic pregnancies     ESOPHAGOGASTRODUODENOSCOPY (EGD) WITH PROPOFOL  N/A 09/15/2022   Procedure: ESOPHAGOGASTRODUODENOSCOPY (EGD) WITH PROPOFOL ;  Surgeon: Cindie Carlin POUR, DO;  Location: [facility];  Service: Endoscopy;  Laterality: N/A;  1015am, asa 2   HOLMIUM LASER APPLICATION Left 01/11/2020   Procedure: HOLMIUM LASER APPLICATION-ureteroscopy;  Surgeon: Sherrilee Belvie CROME, MD;  Location: [facility];  Service: Urology;  Laterality: Left;   left fallopian tube removal     POLYPECTOMY  09/15/2022   Procedure: POLYPECTOMY;  Surgeon: Cindie Carlin POUR, DO;  Location: [facility];  Service: Endoscopy;;   right tube removed     history of ectopic pregnancies    OB History     Gravida  4   Para  1   Term  1   Preterm      AB  3   Living  1      SAB      IAB      Ectopic  3   Multiple      Live Births               Home Medications    Prior to Admission medications  Medication Sig Start Date End Date Taking? Authorizing Provider  Baclofen  5 MG TABS Take 1 tablet (5 mg total) by mouth at bedtime as needed. 03/03/24  Yes Sael Furches K, PA-C  acetaminophen  (TYLENOL ) 500 MG tablet Take 500-1,000 mg by mouth every 6 (six) hours as needed (pain.).    [provider]  esomeprazole  (NEXIUM ) 40 MG capsule Take 1 capsule (40 mg total) by  mouth 2 (two) times daily before a meal. 07/25/22 09/23/22  Kommor, Madison, MD  hydrochlorothiazide  (MICROZIDE ) 12.5 MG capsule Take 1 capsule (12.5 mg total) by mouth daily. 09/30/23   Signa Delon LABOR, NP  hydrOXYzine  (ATARAX ) 10 MG tablet Take 1 tablet (10 mg total) by mouth 3 (three) times daily as needed. Do not take with alcohol or while driving or operating heavy machinery.  May cause drowsiness. 12/08/23   Chandra Harlene LABOR, NP  naphazoline-glycerin (CLEAR EYES REDNESS) 0.012-0.25 % SOLN Place 1-2 drops into both eyes 4 (four) times daily as needed for eye irritation.    [provider]  ondansetron  (ZOFRAN -ODT) 4 MG disintegrating tablet Take 1 tablet (4 mg total) by mouth every 8 (eight) hours as needed for nausea or vomiting. 10/15/23   Stuart Vernell Norris, PA-C  ondansetron  (ZOFRAN -ODT) 4 MG disintegrating tablet Take 1 tablet (4 mg total) by mouth every 8 (eight) hours as needed for nausea or vomiting. 01/30/24   Stuart Vernell Norris, PA-C    Family History Family History  Problem Relation Age of Onset   Hypertension Mother    Cancer Maternal Grandmother        colon cancer; 50s-60s   Cancer Maternal Grandfather        colon cancer; 84s-60s    Social History Social History[1]   Allergies   Augmentin  [amoxicillin -pot clavulanate]   Review of Systems Review of Systems  Constitutional:  Positive for activity change. Negative for appetite change, fatigue and fever.  HENT:  Positive for sinus pressure. Negative for congestion, sneezing and sore throat.   Eyes:  Positive for photophobia. Negative for visual disturbance.  Respiratory:  Negative for cough and shortness of breath.   Cardiovascular:  Negative for chest pain.  Gastrointestinal:  Negative for diarrhea, nausea (Improved) and vomiting.  Musculoskeletal:  Negative for arthralgias and myalgias.  Neurological:  Positive for headaches. Negative for dizziness, seizures, syncope, speech difficulty, weakness  and light-headedness.     Physical Exam Triage Vital Signs ED Triage Vitals  Encounter Vitals Group     BP 03/03/24 1112 (!) 149/84     Girls Systolic BP Percentile --      Girls Diastolic BP Percentile --      Boys Systolic BP Percentile --      Boys Diastolic BP Percentile --      Pulse Rate 03/03/24 1112 85     Resp 03/03/24 1112 18     Temp 03/03/24 1112 98.7 F (37.1 C)     Temp Source 03/03/24 1112 Oral     SpO2 03/03/24 1112 98 %     Weight --      Height --      Head Circumference --      Peak Flow --  Pain Score 03/03/24 1113 6     Pain Loc --      Pain Education --      Exclude from Growth Chart --    No data found.  Updated Vital Signs BP (!) 149/84 (BP Location: Right Arm)   Pulse 85   Temp 98.7 F (37.1 C) (Oral)   Resp 18   LMP 02/25/2024 (Exact Date)   SpO2 98%   Visual Acuity Right Eye Distance:   Left Eye Distance:   Bilateral Distance:    Right Eye Near:   Left Eye Near:    Bilateral Near:     Physical Exam Vitals reviewed.  Constitutional:      General: She is awake. She is not in acute distress.    Appearance: Normal appearance. She is well-developed. She is not ill-appearing.     Comments: Very pleasant female appears stated age in no acute distress sitting comfortably in exam room  HENT:     Head: Normocephalic and atraumatic. No raccoon eyes, Battle's sign or contusion.     Right Ear: Tympanic membrane, ear canal and external ear normal. No hemotympanum.     Left Ear: Tympanic membrane, ear canal and external ear normal. No hemotympanum.     Nose:     Right Sinus: Frontal sinus tenderness present. No maxillary sinus tenderness.     Left Sinus: No maxillary sinus tenderness or frontal sinus tenderness.     Mouth/Throat:     Tongue: Tongue does not deviate from midline.     Pharynx: Uvula midline. No oropharyngeal exudate or posterior oropharyngeal erythema.  Eyes:     Extraocular Movements: Extraocular movements intact.      Pupils: Pupils are equal, round, and reactive to light.  Cardiovascular:     Rate and Rhythm: Normal rate and regular rhythm.     Heart sounds: Normal heart sounds, S1 normal and S2 normal. No murmur heard. Pulmonary:     Effort: Pulmonary effort is normal.     Breath sounds: Normal breath sounds. No wheezing, rhonchi or rales.     Comments: Clear to auscultation bilaterally Musculoskeletal:     Cervical back: No spinous process tenderness or muscular tenderness.     Comments: Strength 5/5 bilateral upper and lower extremities  Neurological:     General: No focal deficit present.     Mental Status: She is alert and oriented to person, place, and time.     Cranial Nerves: Cranial nerves 2-12 are intact.     Motor: Motor function is intact.     Coordination: Coordination is intact.     Gait: Gait is intact.     Comments: Cranial nerves II through XII grossly intact.  No focal neurologic defect on exam.  Psychiatric:        Behavior: Behavior is cooperative.      UC Treatments / Results  Labs (all labs ordered are listed, but only abnormal results are displayed) Labs Reviewed - No data to display  EKG   Radiology No results found.  Procedures Procedures (including critical care time)  Medications Ordered in UC Medications  ketorolac  (TORADOL ) 30 MG/ML injection 30 mg (30 mg Intramuscular Given 03/03/24 1148)    Initial Impression / Assessment and Plan / UC Course  I have reviewed the triage vital signs and the nursing notes.  Pertinent labs & imaging results that were available during my care of the patient were reviewed by me and considered in my medical decision making (see  chart for details).     Patient is well-appearing, afebrile, nontoxic, nontachycardic.  Vital signs and physical exam are reassuring with no indication for emergent evaluation or imaging.  Suspect migraine as etiology of symptoms given her history of similar headaches without evidence of acute  infection on physical exam or other URI symptoms.  She was given Toradol  in clinic with improvement of symptoms (headache pain improved to 4 and a 0-10 pain scale).  We discussed that she can continue using Tylenol  but should avoid NSAIDs for the next 24 hours.  She was given baclofen  for additional pain relief and we discussed that this can be sedating and she is not to drive or drink alcohol while taking it.  No indication for dose adjustment based on metabolic panel from 12/08/2023 with a creatinine of 0.65 calculated creatinine clearance of 143.84 mL/min.  She is to rest and drink plenty of fluid.  We discussed that if her symptoms worsen and she has the worst headache of her life, associated visual disturbance, focal weakness, dysarthria or develops any additional symptoms she should return for reevaluation.  Return precautions given.  Excuse note provided.  Final Clinical Impressions(s) / UC Diagnoses   Final diagnoses:  Migraine without aura and without status migrainosus, not intractable     Discharge Instructions      I did not see any evidence of an infection such as a sinus infection that would warrant antibiotics.  I suspect your headache is related to a migraine.  We gave an injection of Toradol  today so please do not take NSAIDs for the next 24 hours (aspirin , ibuprofen /Advil , naproxen /Aleve ).  You can use Tylenol /acetaminophen  for breakthrough pain.  Take baclofen  at night.  This will make you sleepy so do not drive or drink alcohol while taking it.  Make sure that you rest and drink plenty of fluid.  If your headache is not improving with the medication or if anything worsens you have the worst headache of your life, associated vision changes, weakness of 1 part of your body, trouble speaking, fever, weakness, nausea/vomiting interfering with eating and drinking you need to be seen immediately.     ED Prescriptions     Medication Sig Dispense Auth. Provider   Baclofen  5 MG TABS  Take 1 tablet (5 mg total) by mouth at bedtime as needed. 5 tablet [name] K, PA-C      PDMP not reviewed this encounter.     [1]  Social History Tobacco Use   Smoking status: Never   Smokeless tobacco: Never  Vaping Use   Vaping status: Never Used  Substance Use Topics   Alcohol use: No   Drug use: Not Currently    Types: Benzodiazepines, Marijuana    Comment: opiates- Former     [name] K, PA-C 03/03/24 1234  "

## 2024-03-03 NOTE — ED Triage Notes (Signed)
 Was seen on 1/6 for nausea.  States she now has a headache behind her eye since yesterday.  States the nausea has gotten better.
